# Patient Record
Sex: Female | Born: 1955
Health system: Southern US, Community
[De-identification: ages and names within clinical notes are randomized; demographics above are authoritative.]

## PROBLEM LIST (undated history)

## (undated) DIAGNOSIS — M8580 Other specified disorders of bone density and structure, unspecified site: Secondary | ICD-10-CM

## (undated) DIAGNOSIS — M810 Age-related osteoporosis without current pathological fracture: Secondary | ICD-10-CM

## (undated) DIAGNOSIS — C4492 Squamous cell carcinoma of skin, unspecified: Secondary | ICD-10-CM

## (undated) DIAGNOSIS — E785 Hyperlipidemia, unspecified: Secondary | ICD-10-CM

## (undated) DIAGNOSIS — R911 Solitary pulmonary nodule: Secondary | ICD-10-CM

## (undated) DIAGNOSIS — K219 Gastro-esophageal reflux disease without esophagitis: Secondary | ICD-10-CM

## (undated) HISTORY — DX: Other specified disorders of bone density and structure, unspecified site: M85.80

## (undated) HISTORY — PX: OTHER SURGICAL HISTORY: SHX169

## (undated) HISTORY — DX: Hyperlipidemia, unspecified: E78.5

## (undated) HISTORY — PX: TONSILLECTOMY: SUR1361

## (undated) HISTORY — DX: Age-related osteoporosis without current pathological fracture: M81.0

## (undated) HISTORY — DX: Squamous cell carcinoma of skin, unspecified: C44.92

## (undated) HISTORY — DX: Solitary pulmonary nodule: R91.1

---

## 2006-03-09 ENCOUNTER — Ambulatory Visit: Payer: Self-pay | Admitting: Internal Medicine

## 2006-03-09 ENCOUNTER — Ambulatory Visit (HOSPITAL_COMMUNITY): Admission: RE | Admit: 2006-03-09 | Discharge: 2006-03-09 | Payer: Self-pay | Admitting: Internal Medicine

## 2008-12-12 ENCOUNTER — Ambulatory Visit: Payer: Self-pay | Admitting: Cardiology

## 2016-02-10 ENCOUNTER — Telehealth: Payer: Self-pay | Admitting: Internal Medicine

## 2016-02-10 NOTE — Telephone Encounter (Signed)
RECALL FOR TCS °

## 2016-02-11 NOTE — Telephone Encounter (Signed)
Letter mailed to pt.  

## 2016-07-24 ENCOUNTER — Ambulatory Visit (INDEPENDENT_AMBULATORY_CARE_PROVIDER_SITE_OTHER): Payer: BC Managed Care – PPO | Admitting: Urology

## 2016-07-24 DIAGNOSIS — N952 Postmenopausal atrophic vaginitis: Secondary | ICD-10-CM | POA: Diagnosis not present

## 2016-07-24 DIAGNOSIS — R3121 Asymptomatic microscopic hematuria: Secondary | ICD-10-CM

## 2016-07-24 DIAGNOSIS — R350 Frequency of micturition: Secondary | ICD-10-CM

## 2017-08-12 ENCOUNTER — Encounter: Payer: Self-pay | Admitting: Gastroenterology

## 2017-08-12 ENCOUNTER — Ambulatory Visit: Payer: BC Managed Care – PPO | Admitting: Gastroenterology

## 2017-08-12 VITALS — BP 127/75 | HR 91 | Temp 97.0°F | Ht 66.0 in | Wt 119.0 lb

## 2017-08-12 DIAGNOSIS — R198 Other specified symptoms and signs involving the digestive system and abdomen: Secondary | ICD-10-CM

## 2017-08-12 DIAGNOSIS — K5900 Constipation, unspecified: Secondary | ICD-10-CM | POA: Insufficient documentation

## 2017-08-12 DIAGNOSIS — R140 Abdominal distension (gaseous): Secondary | ICD-10-CM

## 2017-08-12 NOTE — Progress Notes (Signed)
Primary Care Physician:  Rory Percy, MD  Primary Gastroenterologist:  Garfield Cornea, MD   Chief Complaint  Patient presents with  . Constipation  . Colonoscopy    consult    HPI:  [name] is a 61 y.o. female here to discuss updating her colonoscopy. Last colonoscopy 2007.  She is overdue by 1 year.  She is fully aware of the importance of colonoscopy as her husband had colon cancer many years ago.  Over the years she has had increasing issues with gas.  Last year, urinary hesitancy, frequency.  GYN detected microscopic hematuria.  Patient was seen by Dr. Jeffie Pollock 06/2016.  She reports cystoscopy was unremarkable.  She reports that she was started on Estrace for vaginal atrophy.  She saw her GYN, in [place].  Complains of bloating, pelvic ultrasound according to patient was unremarkable.  Patient states she has been on fiber supplement for years.  Over the past year she feels like she has incomplete bowel movements.  She has some stool every day but feels like is not enough.  Stools are more narrow.  No melena or rectal bleeding.  She tries to avoid grain and dairy as it seems to worsen her symptoms.  She eats cheese about once per week.  Uses almond milk.  Avoids carbonated beverages.  Recent labs with PCP dated August 2018 were unremarkable with regards to CBC, LFTs, basic metabolic panel, TSH.      Current Outpatient Medications  Medication Sig Dispense Refill  . atorvastatin (LIPITOR) 10 MG tablet Take 0.5 tablets daily by mouth.  1  . estradiol (ESTRACE VAGINAL) 0.1 MG/GM vaginal cream Place 1 Applicatorful 2 (two) times a week vaginally.     . nabumetone (RELAFEN) 500 MG tablet Take 500 mg as needed by mouth.    . RESTASIS 0.05 % ophthalmic emulsion Place 2 drops daily into both eyes.  0  . valACYclovir (VALTREX) 500 MG tablet Take 500 mg as needed by mouth.     No current facility-administered medications for this visit.     Allergies as of 08/12/2017 - Review Complete  08/12/2017  Allergen Reaction Noted  . Amoxicillin  08/12/2017  . Bactrim [sulfamethoxazole-trimethoprim] Hives 08/12/2017  . Keflex [cephalexin] Hives 08/12/2017    Past Medical History:  Diagnosis Date  . Hyperlipidemia   . Osteopenia     Past Surgical History:  Procedure Laterality Date  . TONSILLECTOMY      Family History  Problem Relation Age of Onset  . Colon cancer Cousin     Social History   Socioeconomic History  . Marital status: Married    Spouse name: Not on file  . Number of children: Not on file  . Years of education: Not on file  . Highest education level: Not on file  Social Needs  . Financial resource strain: Not on file  . Food insecurity - worry: Not on file  . Food insecurity - inability: Not on file  . Transportation needs - medical: Not on file  . Transportation needs - non-medical: Not on file  Occupational History  . Not on file  Tobacco Use  . Smoking status: Never Smoker  . Smokeless tobacco: Never Used  Substance and Sexual Activity  . Alcohol use: No    Frequency: Never  . Drug use: No  . Sexual activity: Not on file  Other Topics Concern  . Not on file  Social History Narrative  . Not on file      ROS:  General: Negative for anorexia, weight loss, fever, chills, fatigue, weakness. Eyes: Negative for vision changes.  ENT: Negative for hoarseness, difficulty swallowing , nasal congestion. CV: Negative for chest pain, angina, palpitations, dyspnea on exertion, peripheral edema.  Respiratory: Negative for dyspnea at rest, dyspnea on exertion, cough, sputum, wheezing.  GI: See history of present illness. GU:  Negative for dysuria, hematuria, urinary incontinence, urinary frequency, nocturnal urination.  MS: Negative for joint pain, low back pain.  Derm: Negative for rash or itching.  Neuro: Negative for weakness, abnormal sensation, seizure, frequent headaches, memory loss, confusion.  Psych: Negative for anxiety, depression,  suicidal ideation, hallucinations.  Endo: Negative for unusual weight change.  Heme: Negative for bruising or bleeding. Allergy: Negative for rash or hives.    Physical Examination:  BP 127/75   Pulse 91   Temp (!) 97 F (36.1 C) (Oral)   Ht 5\' 6"  (1.676 m)   Wt 119 lb (54 kg)   BMI 19.21 kg/m    General: Well-nourished, well-developed in no acute distress.  Head: Normocephalic, atraumatic.   Eyes: Conjunctiva pink, no icterus. Mouth: Oropharyngeal mucosa moist and pink , no lesions erythema or exudate. Neck: Supple without thyromegaly, masses, or lymphadenopathy.  Lungs: Clear to auscultation bilaterally.  Heart: Regular rate and rhythm, no murmurs rubs or gallops.  Abdomen: Bowel sounds are normal, nontender, nondistended, no hepatosplenomegaly or masses, no abdominal bruits or    hernia , no rebound or guarding.   Rectal: Not performed Extremities: No lower extremity edema. No clubbing or deformities.  Neuro: Alert and oriented x 4 , grossly normal neurologically.  Skin: Warm and dry, no rash or jaundice.   Psych: Alert and cooperative, normal mood and affect.  Labs: As above  Imaging Studies: No results found.

## 2017-08-12 NOTE — Patient Instructions (Signed)
1. Please go for labs today. 2. Colonoscopy as scheduled. See separate instructions.  3. Approximately one week prior to your procedure, begin taking Linzess once daily on empty stomach to help move your bowels better. You can stay on this chronically if needed. Call for prescription if you desire.

## 2017-08-12 NOTE — Assessment & Plan Note (Signed)
61 year old female presenting to discuss updating her colonoscopy.  Her last colonoscopy was in 2007.  She was due last year but was unable to complete due to multiple family issues.  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  She complains of 1 year history of change in bowel habits.  Stool caliber is smaller.  She describes incomplete evacuation.  Complains of pelvic bloating.  Recommend colonoscopy at this time.  I have discussed the risks, alternatives, benefits with regards to but not limited to the risk of reaction to medication, bleeding, infection, perforation and the patient is agreeable to proceed. Written consent to be obtained.  Begin Linzess 145 mcg daily at least 5 days prior to her bowel prep.  Samples provided for her to try and she determined she wants to stay on long-term she will call for prescription.  Screen for celiac given bloating, intolerance to gluten.

## 2017-08-13 ENCOUNTER — Encounter: Payer: Self-pay | Admitting: *Deleted

## 2017-08-13 ENCOUNTER — Other Ambulatory Visit: Payer: Self-pay | Admitting: *Deleted

## 2017-08-13 ENCOUNTER — Telehealth: Payer: Self-pay | Admitting: *Deleted

## 2017-08-13 DIAGNOSIS — K5900 Constipation, unspecified: Secondary | ICD-10-CM

## 2017-08-13 DIAGNOSIS — R198 Other specified symptoms and signs involving the digestive system and abdomen: Secondary | ICD-10-CM

## 2017-08-13 MED ORDER — PEG 3350-KCL-NA BICARB-NACL 420 G PO SOLR: 4000.0000 mL | Freq: Once | ORAL | 0 refills | Status: AC

## 2017-08-13 NOTE — Progress Notes (Signed)
cc'ed to pcp °

## 2017-08-13 NOTE — Telephone Encounter (Signed)
Patient called back to schedule TCS. This is scheduled for 09/17/17 at 1:00pm, arrival 12:00pm at [facility]. Prep instructions discussed and mailed to pt. Prep also sent in.

## 2017-08-16 LAB — IGA: IGA/IMMUNOGLOBULIN A, SERUM: 72 mg/dL — AB (ref 87–352)

## 2017-08-16 LAB — TISSUE TRANSGLUTAMINASE, IGA

## 2017-09-01 ENCOUNTER — Telehealth: Payer: Self-pay | Admitting: General Practice

## 2017-09-01 NOTE — Telephone Encounter (Signed)
Patient had labs done a few weeks ago and was wanting results.  Routing to [place].

## 2017-09-02 NOTE — Progress Notes (Signed)
Please let patient know that her IgA level was just slightly low, likely insignificant, but this means that we will need her to have TTG, IgG done to make sure she does not have celiac disease.  Please schedule her for TTG, IgG this week.

## 2017-09-02 NOTE — Telephone Encounter (Signed)
lmom , for a return call

## 2017-09-02 NOTE — Telephone Encounter (Signed)
See result note.  

## 2017-09-03 ENCOUNTER — Other Ambulatory Visit: Payer: Self-pay

## 2017-09-03 ENCOUNTER — Telehealth: Payer: Self-pay | Admitting: Internal Medicine

## 2017-09-03 DIAGNOSIS — K5900 Constipation, unspecified: Secondary | ICD-10-CM

## 2017-09-03 NOTE — Telephone Encounter (Signed)
Pt notified of results and will have labs drawn as directed per LSL

## 2017-09-03 NOTE — Progress Notes (Signed)
Lab entered for TTG, IgG at [place].

## 2017-09-03 NOTE — Addendum Note (Signed)
Addended by: Mahala Menghini on: 09/03/2017 11:27 AM   Modules accepted: Orders

## 2017-09-03 NOTE — Telephone Encounter (Signed)
Returned called, pt notified of results and will have labs drawn as directed per LSL

## 2017-09-03 NOTE — Telephone Encounter (Signed)
Pt said she was returning a call from AM. Please call her back at [phone]

## 2017-09-08 ENCOUNTER — Telehealth: Payer: Self-pay

## 2017-09-08 ENCOUNTER — Other Ambulatory Visit: Payer: Self-pay

## 2017-09-08 DIAGNOSIS — K5900 Constipation, unspecified: Secondary | ICD-10-CM

## 2017-09-08 NOTE — Telephone Encounter (Signed)
Please call patient back at [phone] She said that she went to [facility] on Saturday and they didn't have her lab orders and she needed to have them done prior to her procedure. I transferred call to AM VM.

## 2017-09-08 NOTE — Telephone Encounter (Signed)
Spoke with pt, pt can go to lab and have labs drawn

## 2017-09-09 LAB — TISSUE TRANSGLUTAMINASE, IGG: (TTG) AB, IGG: 1 U/mL

## 2017-09-13 ENCOUNTER — Telehealth: Payer: Self-pay | Admitting: Internal Medicine

## 2017-09-13 NOTE — Telephone Encounter (Signed)
Pt has a procedure scheduled for 12/21 and is anxious to get her lab results from 12/12. Please call her at [phone]

## 2017-09-13 NOTE — Progress Notes (Signed)
Celiac screen negative. Ttg, IgG normal.

## 2017-09-13 NOTE — Telephone Encounter (Signed)
See result note.  

## 2017-09-13 NOTE — Telephone Encounter (Signed)
Lmom, looking into lab results

## 2017-09-13 NOTE — Telephone Encounter (Signed)
Pt is calling about her lab results °

## 2017-09-14 ENCOUNTER — Encounter: Payer: Self-pay | Admitting: *Deleted

## 2017-09-14 NOTE — Telephone Encounter (Signed)
cc'ed to pcp °

## 2017-09-15 ENCOUNTER — Telehealth: Payer: Self-pay | Admitting: *Deleted

## 2017-09-15 NOTE — Telephone Encounter (Signed)
Patient called in stating she spoke with preservice center and they advised her that her TCS was not a screening and coded as diagnostic. She states if it not a screening then her insurance does not cover this at 100%. I advised her per her last OV she had "change in bowel habits/constipation". Nothing further needed

## 2017-09-17 ENCOUNTER — Other Ambulatory Visit: Payer: Self-pay

## 2017-09-17 ENCOUNTER — Encounter (HOSPITAL_COMMUNITY): Payer: Self-pay | Admitting: *Deleted

## 2017-09-17 ENCOUNTER — Ambulatory Visit (HOSPITAL_COMMUNITY)
Admission: RE | Admit: 2017-09-17 | Discharge: 2017-09-17 | Disposition: A | Discharge status: Home / Self Care | Payer: BC Managed Care – PPO | Source: Ambulatory Visit | Attending: Internal Medicine | Admitting: Internal Medicine

## 2017-09-17 ENCOUNTER — Encounter (HOSPITAL_COMMUNITY)
Admission: RE | Disposition: A | Discharge status: Home / Self Care | Payer: Self-pay | Source: Ambulatory Visit | Attending: Internal Medicine

## 2017-09-17 DIAGNOSIS — E785 Hyperlipidemia, unspecified: Secondary | ICD-10-CM | POA: Insufficient documentation

## 2017-09-17 DIAGNOSIS — R194 Change in bowel habit: Secondary | ICD-10-CM | POA: Diagnosis present

## 2017-09-17 DIAGNOSIS — Z79890 Hormone replacement therapy (postmenopausal): Secondary | ICD-10-CM | POA: Insufficient documentation

## 2017-09-17 DIAGNOSIS — Z79899 Other long term (current) drug therapy: Secondary | ICD-10-CM | POA: Diagnosis not present

## 2017-09-17 DIAGNOSIS — R198 Other specified symptoms and signs involving the digestive system and abdomen: Secondary | ICD-10-CM

## 2017-09-17 DIAGNOSIS — K5900 Constipation, unspecified: Secondary | ICD-10-CM

## 2017-09-17 HISTORY — PX: COLONOSCOPY: SHX5424

## 2017-09-17 SURGERY — COLONOSCOPY
Anesthesia: Moderate Sedation

## 2017-09-17 MED ORDER — MEPERIDINE HCL 100 MG/ML IJ SOLN
INTRAMUSCULAR | Status: DC | PRN
  Administered 2017-09-17: 25 mg via INTRAVENOUS
  Administered 2017-09-17: 50 mg via INTRAVENOUS

## 2017-09-17 MED ORDER — MEPERIDINE HCL 100 MG/ML IJ SOLN
INTRAMUSCULAR | Status: AC
  Filled 2017-09-17: qty 2

## 2017-09-17 MED ORDER — STERILE WATER FOR IRRIGATION IR SOLN
Status: DC | PRN
  Administered 2017-09-17: 15 mL

## 2017-09-17 MED ORDER — MIDAZOLAM HCL 5 MG/5ML IJ SOLN
INTRAMUSCULAR | Status: DC | PRN
  Administered 2017-09-17: 1 mg via INTRAVENOUS
  Administered 2017-09-17: 2 mg via INTRAVENOUS

## 2017-09-17 MED ORDER — ONDANSETRON HCL 4 MG/2ML IJ SOLN
INTRAMUSCULAR | Status: DC | PRN
  Administered 2017-09-17: 4 mg via INTRAVENOUS

## 2017-09-17 MED ORDER — ONDANSETRON HCL 4 MG/2ML IJ SOLN
INTRAMUSCULAR | Status: AC
  Filled 2017-09-17: qty 2

## 2017-09-17 MED ORDER — MIDAZOLAM HCL 5 MG/5ML IJ SOLN
INTRAMUSCULAR | Status: AC
  Filled 2017-09-17: qty 10

## 2017-09-17 MED ORDER — SODIUM CHLORIDE 0.9 % IV SOLN
INTRAVENOUS | Status: DC
  Administered 2017-09-17: 12:00:00 via INTRAVENOUS

## 2017-09-17 NOTE — Progress Notes (Signed)
Patient states "I think I am finally ready to go".

## 2017-09-17 NOTE — H&P (Signed)
@LOGO @   Primary Care Physician:  Rory Percy, MD Primary Gastroenterologist:  Dr. Gala Romney  Pre-Procedure History & Physical: HPI:  [name] is a 61 y.o. female here for screening colonoscopy. Hasn't had any bowel symptoms aside from constipation lately which has been treated with lenses. No family history of colon cancer in any first or second-degree relatives.  Past Medical History:  Diagnosis Date  . Hyperlipidemia   . Osteopenia     Past Surgical History:  Procedure Laterality Date  . TONSILLECTOMY      Prior to Admission medications   Medication Sig Start Date End Date Taking? Authorizing Provider  atorvastatin (LIPITOR) 10 MG tablet Take 0.5 tablets daily by mouth. 07/28/17  Yes [provider]  Calcium Polycarbophil (FIBER-CAPS PO) Take 1 capsule by mouth daily.   Yes [provider]  Calcium-Phosphorus-Vitamin D (CITRACAL +D3 PO) Take 2 tablets by mouth 2 (two) times daily.   Yes [provider]  Carboxymethylcellul-Glycerin (REFRESH OPTIVE OP) Place 1 drop into both eyes 2 (two) times daily. Refresh Repair   Yes [provider]  cycloSPORINE (RESTASIS) 0.05 % ophthalmic emulsion Place 1 drop into both eyes 2 (two) times daily.   Yes [provider]  estradiol (ESTRACE VAGINAL) 0.1 MG/GM vaginal cream Place 1 Applicatorful 2 (two) times a week vaginally.    Yes [provider]  Lifitegrast Shirley Friar) 5 % SOLN Place 1 drop into both eyes 2 (two) times daily. Shirley Friar   Yes [provider]  linaclotide (LINZESS) 145 MCG CAPS capsule Take 145 mcg by mouth daily before breakfast.   Yes [provider]  Multiple Vitamins-Minerals (MULTIVITAMIN WITH MINERALS) tablet Take 1 tablet by mouth daily.   Yes [provider]  valACYclovir (VALTREX) 1000 MG tablet Take 500 mg by mouth 2 (two) times daily as needed (fever blisters).    Yes [provider]  nabumetone (RELAFEN) 500 MG tablet Take 500 mg by  mouth 2 (two) times daily as needed for mild pain.     [provider]    Allergies as of 08/13/2017 - Review Complete 08/12/2017  Allergen Reaction Noted  . Amoxicillin  08/12/2017  . Bactrim [sulfamethoxazole-trimethoprim] Hives 08/12/2017  . Keflex [cephalexin] Hives 08/12/2017    Family History  Problem Relation Age of Onset  . Colon cancer Cousin     Social History   Socioeconomic History  . Marital status: Married    Spouse name: Not on file  . Number of children: Not on file  . Years of education: Not on file  . Highest education level: Not on file  Social Needs  . Financial resource strain: Not on file  . Food insecurity - worry: Not on file  . Food insecurity - inability: Not on file  . Transportation needs - medical: Not on file  . Transportation needs - non-medical: Not on file  Occupational History  . Not on file  Tobacco Use  . Smoking status: Never Smoker  . Smokeless tobacco: Never Used  Substance and Sexual Activity  . Alcohol use: No    Frequency: Never  . Drug use: No  . Sexual activity: Not on file  Other Topics Concern  . Not on file  Social History Narrative  . Not on file    Review of Systems: See HPI, otherwise negative ROS  Physical Exam: BP 114/73   Pulse (!) 110   Temp 98.4 F (36.9 C) (Oral)   Resp 17   Ht 5\' 6"  (  1.676 m)   Wt 120 lb (54.4 kg)   SpO2 100%   BMI 19.37 kg/m  General:   Alert,  Well-developed, well-nourished, pleasant and cooperative in NAD Skin:  Intact without significant lesions or rashes. Neck:  Supple; no masses or thyromegaly. No significant cervical adenopathy. Lungs:  Clear throughout to auscultation.   No wheezes, crackles, or rhonchi. No acute distress. Heart:  Regular rate and rhythm; no murmurs, clicks, rubs,  or gallops. Abdomen: Non-distended, normal bowel sounds.  Soft and nontender without appreciable mass or hepatosplenomegaly.  Pulses:  Normal pulses noted. Extremities:  Without  clubbing or edema.  Impression:  61 year old lady here for average risk screening colonoscopy.  Recommendations: I have offered the patient at average risk screening colonoscopy today.  The risks, benefits, limitations, alternatives and imponderables have been reviewed with the patient. Questions have been answered. All parties are agreeable.    Notice: This dictation was prepared with Dragon dictation along with smaller phrase technology. Any transcriptional errors that result from this process are unintentional and may not be corrected upon review.

## 2017-09-17 NOTE — Op Note (Signed)
[facility] Patient Name: [name] Procedure Date: 09/17/2017 12:10 PM MRN: [id number] Date of Birth: 10/19/1955 Attending MD: Norvel Richards , MD CSN: [id number] Age: 61 Admit Type: Outpatient Procedure:                Colonoscopy Indications:              Change in bowel habits Providers:                Norvel Richards, MD, Lurline Del, RN, Charlsie Quest.                            Theda Sers RN, RN Referring MD:              Medicines:                Midazolam 3 mg IV, Meperidine 75 mg IV, Ondansetron                            4 mg IV Complications:            No immediate complications. Estimated Blood Loss:     Estimated blood loss: none. Procedure:                Pre-Anesthesia Assessment:                           - Prior to the procedure, a History and Physical                            was performed, and patient medications and                            allergies were reviewed. The patient's tolerance of                            previous anesthesia was also reviewed. The risks                            and benefits of the procedure and the sedation                            options and risks were discussed with the patient.                            All questions were answered, and informed consent                            was obtained. Prior Anticoagulants: The patient has                            taken no previous anticoagulant or antiplatelet                            agents. ASA Grade Assessment: II - A patient with  mild systemic disease. After reviewing the risks                            and benefits, the patient was deemed in                            satisfactory condition to undergo the procedure.                           After obtaining informed consent, the colonoscope                            was passed under direct vision. Throughout the                            procedure, the patient's blood pressure,  pulse, and                            oxygen saturations were monitored continuously. The                            EC-[id number] ([id number]) scope was introduced through                            the anus and advanced to the the cecum, identified                            by appendiceal orifice and ileocecal valve. The                            ileocecal valve, appendiceal orifice, and rectum                            were photographed. The entire colon was well                            visualized. The colonoscopy was performed without                            difficulty. The quality of the bowel preparation                            was adequate. Scope In: 12:34:01 AM Scope Out: 12:44:31 PM Scope Withdrawal Time: 0 hours 5 minutes 59 seconds  Total Procedure Duration: 12 hours 10 minutes 30 seconds  Findings:      The perianal and digital rectal examinations were normal.      The colon (entire examined portion) appeared normal.      No additional abnormalities were found on retroflexion. Impression:               - The entire examined colon is normal.                           - No specimens collected. Moderate Sedation:      Moderate (  conscious) sedation was administered by the endoscopy nurse       and supervised by the endoscopist. The following parameters were       monitored: oxygen saturation, heart rate, blood pressure, respiratory       rate, EKG, adequacy of pulmonary ventilation, and response to care.       Total physician intraservice time was 16 minutes. Recommendation:           - Patient has a contact number available for                            emergencies. The signs and symptoms of potential                            delayed complications were discussed with the                            patient. Return to normal activities tomorrow.                            Written discharge instructions were provided to the                            patient.                            - Resume previous diet.                           - Continue present medications. Stop current fiber                            supplement; begin Benefiber 1 tablespoon twice                            daily.                           - Await pathology results.                           - Repeat colonoscopy in 10 years for screening                            purposes.                           - Return to GI clinic in 6 weeks. Procedure Code(s):        --- Professional ---                           [phone], Colonoscopy, flexible; diagnostic, including                            collection of specimen(s) by brushing or washing,  when performed (separate procedure)                           99152, Moderate sedation services provided by the                            same physician or other qualified health care                            professional performing the diagnostic or                            therapeutic service that the sedation supports,                            requiring the presence of an independent trained                            observer to assist in the monitoring of the                            patient's level of consciousness and physiological                            status; initial 15 minutes of intraservice time,                            patient age 70 years or older Diagnosis Code(s):        --- Professional ---                           R19.4, Change in bowel habit CPT copyright 2016 American Medical Association. All rights reserved. The codes documented in this report are preliminary and upon coder review may  be revised to meet current compliance requirements. [name]. [name], MD Norvel Richards, MD 09/17/2017 12:53:25 PM This report has been signed electronically. Number of Addenda: 0

## 2017-09-17 NOTE — Discharge Instructions (Signed)
°  Colonoscopy Discharge Instructions  Read the instructions outlined below and refer to this sheet in the next few weeks. These discharge instructions provide you with general information on caring for yourself after you leave the hospital. Your doctor may also give you specific instructions. While your treatment has been planned according to the most current medical practices available, unavoidable complications occasionally occur. If you have any problems or questions after discharge, call Dr. Gala Romney at [phone]. ACTIVITY  You may resume your regular activity, but move at a slower pace for the next 24 hours.   Take frequent rest periods for the next 24 hours.   Walking will help get rid of the air and reduce the bloated feeling in your belly (abdomen).   No driving for 24 hours (because of the medicine (anesthesia) used during the test).    Do not sign any important legal documents or operate any machinery for 24 hours (because of the anesthesia used during the test).  NUTRITION  Drink plenty of fluids.   You may resume your normal diet as instructed by your doctor.   Begin with a light meal and progress to your normal diet. Heavy or fried foods are harder to digest and may make you feel sick to your stomach (nauseated).   Avoid alcoholic beverages for 24 hours or as instructed.  MEDICATIONS  You may resume your normal medications unless your doctor tells you otherwise.  WHAT YOU CAN EXPECT TODAY  Some feelings of bloating in the abdomen.   Passage of more gas than usual.   Spotting of blood in your stool or on the toilet paper.  IF YOU HAD POLYPS REMOVED DURING THE COLONOSCOPY:  No aspirin products for 7 days or as instructed.   No alcohol for 7 days or as instructed.   Eat a soft diet for the next 24 hours.  FINDING OUT THE RESULTS OF YOUR TEST Not all test results are available during your visit. If your test results are not back during the visit, make an appointment  with your caregiver to find out the results. Do not assume everything is normal if you have not heard from your caregiver or the medical facility. It is important for you to follow up on all of your test results.  SEEK IMMEDIATE MEDICAL ATTENTION IF:  You have more than a spotting of blood in your stool.   Your belly is swollen (abdominal distention).   You are nauseated or vomiting.   You have a temperature over 101.   You have abdominal pain or discomfort that is severe or gets worse throughout the day.    Your colonoscopy was normal today  I recommend you have a repeat colonoscopy for screening purposes in 10 years  Stop current fiber supplements; begin Benefiber 1 tablespoon twice daily.  FOLLOW-up with [place] in 4-6 weeks

## 2017-09-23 ENCOUNTER — Encounter (HOSPITAL_COMMUNITY): Payer: Self-pay | Admitting: Internal Medicine

## 2017-11-05 ENCOUNTER — Ambulatory Visit: Payer: BC Managed Care – PPO | Admitting: Internal Medicine

## 2017-11-05 ENCOUNTER — Encounter: Payer: Self-pay | Admitting: Internal Medicine

## 2017-11-05 VITALS — BP 117/73 | HR 68 | Temp 97.9°F | Ht 65.0 in | Wt 119.8 lb

## 2017-11-05 DIAGNOSIS — K9289 Other specified diseases of the digestive system: Secondary | ICD-10-CM | POA: Diagnosis not present

## 2017-11-05 NOTE — Progress Notes (Signed)
Primary Care Physician:  Rory Percy, MD Primary Gastroenterologist:  Dr. Gala Romney  Pre-Procedure History & Physical: HPI:  [name] is a 62 y.o. female here for Follow-up of gastric change in bowel habits. Benefiber 2 tablespoons daily has been associated with a much better evacuation  -  patient feels less full; however,  she did complains of bloating. She has not had any diarrhea. Celiac markers came back negative. Colonoscopy revealed no abnormalities;  not taking a probiotic. Her appetite is well-maintained.  She has not lost any weight. She avoids lactose,  artificial sweeteners as a preventative measure.  Past Medical History:  Diagnosis Date  . Hyperlipidemia   . Osteopenia     Past Surgical History:  Procedure Laterality Date  . COLONOSCOPY N/A 09/17/2017   Procedure: COLONOSCOPY;  Surgeon: Daneil Dolin, MD;  Location: [facility];  Service: Endoscopy;  Laterality: N/A;  1:00PM  . TONSILLECTOMY      Prior to Admission medications   Medication Sig Start Date End Date Taking? Authorizing Provider  atorvastatin (LIPITOR) 10 MG tablet Take 0.5 tablets daily by mouth. 07/28/17  Yes [provider]  Calcium Polycarbophil (FIBER-CAPS PO) Take 1 capsule by mouth daily.   Yes [provider]  Calcium-Phosphorus-Vitamin D (CITRACAL +D3 PO) Take 2 tablets by mouth 2 (two) times daily.   Yes [provider]  Carboxymethylcellul-Glycerin (REFRESH OPTIVE OP) Place 1 drop into both eyes 2 (two) times daily. Refresh Repair   Yes [provider]  cycloSPORINE (RESTASIS) 0.05 % ophthalmic emulsion Place 1 drop into both eyes 2 (two) times daily.   Yes [provider]  estradiol (ESTRACE VAGINAL) 0.1 MG/GM vaginal cream Place 1 Applicatorful 2 (two) times a week vaginally.    Yes [provider]  Multiple Vitamins-Minerals (MULTIVITAMIN WITH MINERALS) tablet Take 1 tablet by mouth daily.   Yes [provider]  nabumetone  (RELAFEN) 500 MG tablet Take 500 mg by mouth 2 (two) times daily as needed for mild pain.    Yes [provider]  valACYclovir (VALTREX) 1000 MG tablet Take 500 mg by mouth 2 (two) times daily as needed (fever blisters).    Yes [provider]  Lifitegrast Shirley Friar) 5 % SOLN Place 1 drop into both eyes 2 (two) times daily. Shirley Friar    [provider]  linaclotide Rolan Lipa) 145 MCG CAPS capsule Take 145 mcg by mouth daily before breakfast.    [provider]    Allergies as of 11/05/2017 - Review Complete 11/05/2017  Allergen Reaction Noted  . Amoxicillin  08/12/2017  . Bactrim [sulfamethoxazole-trimethoprim] Hives 08/12/2017  . Keflex [cephalexin] Hives 08/12/2017    Family History  Problem Relation Age of Onset  . Colon cancer Cousin     Social History   Socioeconomic History  . Marital status: Married    Spouse name: Not on file  . Number of children: Not on file  . Years of education: Not on file  . Highest education level: Not on file  Social Needs  . Financial resource strain: Not on file  . Food insecurity - worry: Not on file  . Food insecurity - inability: Not on file  . Transportation needs - medical: Not on file  . Transportation needs - non-medical: Not on file  Occupational History  . Not on file  Tobacco Use  . Smoking status: Never Smoker  . Smokeless tobacco: Never Used  Substance and Sexual Activity  . Alcohol use: No  Frequency: Never  . Drug use: No  . Sexual activity: Not on file  Other Topics Concern  . Not on file  Social History Narrative  . Not on file    Review of Systems: See HPI, otherwise negative ROS  Physical Exam: BP 117/73   Pulse 68   Temp 97.9 F (36.6 C)   Ht 5\' 5"  (1.651 m)   Wt 119 lb 12.8 oz (54.3 kg)   BMI 19.94 kg/m  General:   Alert,  , pleasant and cooperative in NAD Neck:  Supple; no masses or thyromegaly. No significant cervical adenopathy. Lungs:  Clear throughout to  auscultation.   No wheezes, crackles, or rhonchi. No acute distress. Heart:  Regular rate and rhythm; no murmurs, clicks, rubs,  or gallops. Abdomen: Non-distended, normal bowel sounds.  Soft and nontender without appreciable mass or hepatosplenomegaly.  Pulses:  Normal pulses noted. Extremities:  Without clubbing or edema.  Impression:  Pleasant 62 year old lady with a gas bloat/, change in bowel habits notably improved on 2 tablespoons of Benefiber daily. Continues to have some persistent bloating. Workup thus far reassuring. I feel that we can gain better control of her symptoms with the addition of a probiotic.  Recommendations:  Continue Benefiber 1 tablespoon twice daily  Trial of Probiotic such as Align, Digestive Advantage or Rio (take in the middle of the day)  Office visit in 3 months           Notice: This dictation was prepared with Dragon dictation along with smaller phrase technology. Any transcriptional errors that result from this process are unintentional and may not be corrected upon review.

## 2017-11-05 NOTE — Patient Instructions (Signed)
Continue Benefiber 1 tablespoon twice daily  Trial of Probiotic such as Align, Digestive Advantage or [facility] (take in the middle of the day)  Office visit in 3 months

## 2018-02-02 ENCOUNTER — Ambulatory Visit: Payer: BC Managed Care – PPO | Admitting: Gastroenterology

## 2018-07-27 ENCOUNTER — Encounter: Payer: BC Managed Care – PPO | Attending: Internal Medicine | Admitting: Internal Medicine

## 2018-07-27 DIAGNOSIS — L97211 Non-pressure chronic ulcer of right calf limited to breakdown of skin: Secondary | ICD-10-CM | POA: Insufficient documentation

## 2018-07-27 DIAGNOSIS — Y848 Other medical procedures as the cause of abnormal reaction of the patient, or of later complication, without mention of misadventure at the time of the procedure: Secondary | ICD-10-CM | POA: Insufficient documentation

## 2018-07-27 DIAGNOSIS — T8131XA Disruption of external operation (surgical) wound, not elsewhere classified, initial encounter: Secondary | ICD-10-CM | POA: Diagnosis not present

## 2018-07-27 DIAGNOSIS — C44722 Squamous cell carcinoma of skin of right lower limb, including hip: Secondary | ICD-10-CM | POA: Insufficient documentation

## 2018-07-27 DIAGNOSIS — Z881 Allergy status to other antibiotic agents status: Secondary | ICD-10-CM | POA: Diagnosis not present

## 2018-07-27 DIAGNOSIS — Z885 Allergy status to narcotic agent status: Secondary | ICD-10-CM | POA: Insufficient documentation

## 2018-07-27 DIAGNOSIS — Z8249 Family history of ischemic heart disease and other diseases of the circulatory system: Secondary | ICD-10-CM | POA: Insufficient documentation

## 2018-07-27 DIAGNOSIS — Z882 Allergy status to sulfonamides status: Secondary | ICD-10-CM | POA: Diagnosis not present

## 2018-07-29 NOTE — Progress Notes (Signed)
ARIAH, MOWER ([id number]) Visit Report for 07/27/2018 Abuse/Suicide Risk Screen Details Patient Name: [name], OELKERS Date of Service: 07/27/2018 8:45 AM Medical Record Number: [id number] Patient Account Number: [id number] Date of Birth/Sex: Nov 30, 1955 (62 y.o. F) Treating RN: Montey Hora Primary Care Dollie Bressi: Rory Percy Other Clinician: Referring Koi Yarbro: Referral, Self Treating Wade Sigala/Extender: Tito Dine in Treatment: 0 Abuse/Suicide Risk Screen Items Answer ABUSE/SUICIDE RISK SCREEN: Has anyone close to you tried to hurt or harm you recentlyo No Do you feel uncomfortable with anyone in your familyo No Has anyone forced you do things that you didnot want to doo No Do you have any thoughts of harming yourselfo No Patient displays signs or symptoms of abuse and/or neglect. No Electronic Signature(s) Signed: 07/27/2018 5:41:21 PM By: Montey Hora Entered By: Montey Hora on 07/27/2018 09:16:30 [name] ([id number]) -------------------------------------------------------------------------------- Activities of Daily Living Details Patient Name: [name] Date of Service: 07/27/2018 8:45 AM Medical Record Number: [id number] Patient Account Number: [id number] Date of Birth/Sex: [birth year] (62 y.o. F) Treating RN: Montey Hora Primary Care Shahd Occhipinti: Rory Percy Other Clinician: Referring Johnye Kist: Referral, Self Treating Emelynn Rance/Extender: Tito Dine in Treatment: 0 Activities of Daily Living Items Answer Activities of Daily Living (Please select one for each item) Drive Automobile Completely Able Take Medications Completely Able Use Telephone Completely Able Care for Appearance Completely Able Use Toilet Completely Able Bath / Shower Completely Able Dress Self Completely Able Feed Self Completely Able Walk Completely Able Get In / Out Bed Completely Able Housework Completely Able Prepare Meals Completely Able Handle Money  Completely Able Shop for Self Completely Able Electronic Signature(s) Signed: 07/27/2018 5:41:21 PM By: Montey Hora Entered By: Montey Hora on 07/27/2018 09:16:50 [name] ([id number]) -------------------------------------------------------------------------------- Education Assessment Details Patient Name: [name] Date of Service: 07/27/2018 8:45 AM Medical Record Number: [id number] Patient Account Number: [id number] Date of Birth/Sex: [birth year] (62 y.o. F) Treating RN: Montey Hora Primary Care Mariah Harn: Rory Percy Other Clinician: Referring Arcangel Minion: Referral, Self Treating Alianna Wurster/Extender: Tito Dine in Treatment: 0 Primary Learner Assessed: Patient Learning Preferences/Education Level/Primary Language Learning Preference: Explanation, Demonstration Highest Education Level: College or Above Preferred Language: English Cognitive Barrier Assessment/Beliefs Language Barrier: No Translator Needed: No Memory Deficit: No Emotional Barrier: No Cultural/Religious Beliefs Affecting Medical Care: No Physical Barrier Assessment Impaired Vision: No Impaired Hearing: No Decreased Hand dexterity: No Knowledge/Comprehension Assessment Knowledge Level: Medium Comprehension Level: Medium Ability to understand written Medium instructions: Ability to understand verbal Medium instructions: Motivation Assessment Anxiety Level: Calm Cooperation: Cooperative Education Importance: Acknowledges Need Interest in Health Problems: Asks Questions Perception: Coherent Willingness to Engage in Self- Medium Management Activities: Readiness to Engage in Self- Medium Management Activities: Electronic Signature(s) Signed: 07/27/2018 5:41:21 PM By: Montey Hora Entered By: Montey Hora on 07/27/2018 09:17:23 [name] ([id number]) -------------------------------------------------------------------------------- Fall Risk Assessment Details Patient  Name: [name] Date of Service: 07/27/2018 8:45 AM Medical Record Number: [id number] Patient Account Number: [id number] Date of Birth/Sex: [birth year] (62 y.o. F) Treating RN: Montey Hora Primary Care Lucyann Romano: Rory Percy Other Clinician: Referring Ameir Faria: Referral, Self Treating Carlin Attridge/Extender: Tito Dine in Treatment: 0 Fall Risk Assessment Items Have you had 2 or more falls in the last 12 monthso 0 No Have you had any fall that resulted in injury in the last 12 monthso 0 No FALL RISK ASSESSMENT: History of falling - immediate or within 3 months 0 No Secondary diagnosis 0 No Ambulatory aid None/bed rest/wheelchair/nurse 0 Yes Crutches/cane/walker 0 No Furniture  0 No IV Access/Saline Lock 0 No Gait/Training Normal/bed rest/immobile 0 Yes Weak 0 No Impaired 0 No Mental Status Oriented to own ability 0 Yes Electronic Signature(s) Signed: 07/27/2018 5:41:21 PM By: Montey Hora Entered By: Montey Hora on 07/27/2018 09:17:36 Alper, Rondel Jumbo ([id number]) -------------------------------------------------------------------------------- Foot Assessment Details Patient Name: [name] Date of Service: 07/27/2018 8:45 AM Medical Record Number: [id number] Patient Account Number: [id number] Date of Birth/Sex: [birth year] (62 y.o. F) Treating RN: Montey Hora Primary Care Blyss Lugar: Rory Percy Other Clinician: Referring Natesha Hassey: Referral, Self Treating Vici Novick/Extender: Tito Dine in Treatment: 0 Foot Assessment Items Site Locations + = Sensation present, - = Sensation absent, C = Callus, U = Ulcer R = Redness, W = Warmth, M = Maceration, PU = Pre-ulcerative lesion F = Fissure, S = Swelling, D = Dryness Assessment Right: Left: Other Deformity: No No Prior Foot Ulcer: No No Prior Amputation: No No Charcot Joint: No No Ambulatory Status: Ambulatory Without Help Gait: Steady Electronic Signature(s) Signed: 07/27/2018 5:41:21 PM By:  Montey Hora Entered By: Montey Hora on 07/27/2018 09:28:50 [name] ([id number]) -------------------------------------------------------------------------------- Nutrition Risk Assessment Details Patient Name: [name] Date of Service: 07/27/2018 8:45 AM Medical Record Number: [id number] Patient Account Number: [id number] Date of Birth/Sex: 05/22/1956 (62 y.o. F) Treating RN: Montey Hora Primary Care Dessie Delcarlo: Rory Percy Other Clinician: Referring Yael Coppess: Referral, Self Treating Kaisha Wachob/Extender: Tito Dine in Treatment: 0 Height (in): 66 Weight (lbs): 120 Body Mass Index (BMI): 19.4 Nutrition Risk Assessment Items NUTRITION RISK SCREEN: I have an illness or condition that made me change the kind and/or amount of 0 No food I eat I eat fewer than two meals per day 0 No I eat few fruits and vegetables, or milk products 0 No I have three or more drinks of beer, liquor or wine almost every day 0 No I have tooth or mouth problems that make it hard for me to eat 0 No I don't always have enough money to buy the food I need 0 No I eat alone most of the time 0 No I take three or more different prescribed or over-the-counter drugs a day 1 Yes Without wanting to, I have lost or gained 10 pounds in the last six months 0 No I am not always physically able to shop, cook and/or feed myself 0 No Nutrition Protocols Good Risk Protocol 0 No interventions needed Moderate Risk Protocol Electronic Signature(s) Signed: 07/27/2018 5:41:21 PM By: Montey Hora Entered By: Montey Hora on 07/27/2018 09:17:42

## 2018-07-30 NOTE — Progress Notes (Signed)
LOLLY, GLAUS ([id number]) Visit Report for 07/27/2018 Allergy List Details Patient Name: [name], [name] Date of Service: 07/27/2018 8:45 AM Medical Record Number: [id number] Patient Account Number: [id number] Date of Birth/Sex: 30-Dec-1955 (62 y.o. F) Treating RN: Montey Hora Primary Care Phil Michels: Rory Percy Other Clinician: Referring Vidya Bamford: Referral, Self Treating Johnika Escareno/Extender: Ricard Dillon Weeks in Treatment: 0 Allergies Active Allergies Keflex Bactrim hydrocodone Allergy Notes Electronic Signature(s) Signed: 07/27/2018 5:41:21 PM By: Montey Hora Entered By: Montey Hora on 07/27/2018 09:19:42 [name] ([id number]) -------------------------------------------------------------------------------- Arrival Information Details Patient Name: [name] Date of Service: 07/27/2018 8:45 AM Medical Record Number: [id number] Patient Account Number: [id number] Date of Birth/Sex: 1956/08/31 (62 y.o. F) Treating RN: Cornell Barman Primary Care Semisi Biela: Rory Percy Other Clinician: Referring Diksha Tagliaferro: Referral, Self Treating Zohal Reny/Extender: Tito Dine in Treatment: 0 Visit Information Patient Arrived: Ambulatory Arrival Time: 08:44 Accompanied By: self Transfer Assistance: None Patient Identification Verified: Yes Secondary Verification Process Completed: Yes Electronic Signature(s) Signed: 07/27/2018 4:40:44 PM By: Lorine Bears RCP, RRT, CHT Entered By: Lorine Bears on 07/27/2018 08:46:27 [name] ([id number]) -------------------------------------------------------------------------------- Clinic Level of Care Assessment Details Patient Name: [name] Date of Service: 07/27/2018 8:45 AM Medical Record Number: [id number] Patient Account Number: [id number] Date of Birth/Sex: Oct 25, 1955 (62 y.o. F) Treating RN: Cornell Barman Primary Care Tavien Chestnut: Rory Percy Other Clinician: Referring Jamya Starry:  Referral, Self Treating Kayliegh Boyers/Extender: Tito Dine in Treatment: 0 Clinic Level of Care Assessment Items TOOL 1 Quantity Score []  - Use when EandM and Procedure is performed on INITIAL visit 0 ASSESSMENTS - Nursing Assessment / Reassessment []  - General Physical Exam (combine w/ comprehensive assessment (listed just below) when 0 performed on new pt. evals) X- 1 25 Comprehensive Assessment (HX, ROS, Risk Assessments, Wounds Hx, etc.) ASSESSMENTS - Wound and Skin Assessment / Reassessment []  - Dermatologic / Skin Assessment (not related to wound area) 0 ASSESSMENTS - Ostomy and/or Continence Assessment and Care []  - Incontinence Assessment and Management 0 []  - 0 Ostomy Care Assessment and Management (repouching, etc.) PROCESS - Coordination of Care X - Simple Patient / Family Education for ongoing care 1 15 []  - 0 Complex (extensive) Patient / Family Education for ongoing care X- 1 10 Staff obtains Programmer, systems, Records, Test Results / Process Orders []  - 0 Staff telephones HHA, Nursing Homes / Clarify orders / etc []  - 0 Routine Transfer to another Facility (non-emergent condition) []  - 0 Routine Hospital Admission (non-emergent condition) []  - 0 New Admissions / Biomedical engineer / Ordering NPWT, Apligraf, etc. []  - 0 Emergency Hospital Admission (emergent condition) PROCESS - Special Needs []  - Pediatric / Minor Patient Management 0 []  - 0 Isolation Patient Management []  - 0 Hearing / Language / Visual special needs []  - 0 Assessment of Community assistance (transportation, D/C planning, etc.) []  - 0 Additional assistance / Altered mentation []  - 0 Support Surface(s) Assessment (bed, cushion, seat, etc.) Anderson, Sally A. ([id number]) INTERVENTIONS - Miscellaneous []  - External ear exam 0 []  - 0 Patient Transfer (multiple staff / Civil Service fast streamer / Similar devices) []  - 0 Simple Staple / Suture removal (25 or less) []  - 0 Complex Staple / Suture  removal (26 or more) []  - 0 Hypo/Hyperglycemic Management (do not check if billed separately) X- 1 15 Ankle / Brachial Index (ABI) - do not check if billed separately Has the patient been seen at the hospital within the last three years: Yes Total Score: 65 Level Of Care: New/Established -  Level 2 Electronic Signature(s) Signed: 07/28/2018 7:26:08 AM By: Gretta Cool, BSN, RN, CWS, Kim RN, BSN Entered By: Gretta Cool, BSN, RN, CWS, Kim on 07/27/2018 10:01:37 [name] ([id number]) -------------------------------------------------------------------------------- Encounter Discharge Information Details Patient Name: [name] Date of Service: 07/27/2018 8:45 AM Medical Record Number: [id number] Patient Account Number: [id number] Date of Birth/Sex: 1956-08-20 (62 y.o. F) Treating RN: Cornell Barman Primary Care Takeyla Million: Rory Percy Other Clinician: Referring Davelyn Gwinn: Referral, Self Treating [name]/Extender: Tito Dine in Treatment: 0 Encounter Discharge Information Items Post Procedure Vitals Discharge Condition: Stable Temperature (F): 98.0 Ambulatory Status: Ambulatory Pulse (bpm): 68 Discharge Destination: Home Respiratory Rate (breaths/min): 16 Transportation: Private Auto Blood Pressure (mmHg): 128/60 Accompanied By: self Schedule Follow-up Appointment: Yes Clinical Summary of Care: Electronic Signature(s) Signed: 07/28/2018 7:26:08 AM By: Gretta Cool, BSN, RN, CWS, Kim RN, BSN Entered By: Gretta Cool, BSN, RN, CWS, Kim on 07/27/2018 10:03:01 [name] ([id number]) -------------------------------------------------------------------------------- Lower Extremity Assessment Details Patient Name: [name] Date of Service: 07/27/2018 8:45 AM Medical Record Number: [id number] Patient Account Number: [id number] Date of Birth/Sex: 1956-08-01 (62 y.o. F) Treating RN: Montey Hora Primary Care Shantese Raven: Rory Percy Other Clinician: Referring Kemaya Dorner: Referral,  Self Treating [name]/Extender: Tito Dine in Treatment: 0 Edema Assessment Assessed: [Left: No] [Right: No] Edema: [Left: N] [Right: o] Vascular Assessment Pulses: Dorsalis Pedis Palpable: [Right:Yes] Doppler Audible: [Right:Yes] Posterior Tibial Palpable: [Right:Yes] Doppler Audible: [Right:Yes] Extremity colors, hair growth, and conditions: Extremity Color: [Right:Normal] Hair Growth on Extremity: [Right:Yes] Temperature of Extremity: [Right:Warm] Capillary Refill: [Right:< 3 seconds] Blood Pressure: Brachial: [Right:124] Dorsalis Pedis: [Left:Dorsalis Pedis: 142] Ankle: Posterior Tibial: [Left:Posterior Tibial: 146] [Right:1.18] Toe Nail Assessment Left: Right: Thick: No Discolored: No Deformed: No Improper Length and Hygiene: No Electronic Signature(s) Signed: 07/27/2018 5:41:21 PM By: Montey Hora Entered By: Montey Hora on 07/27/2018 09:33:46 Anderson, Sally Jumbo ([id number]) -------------------------------------------------------------------------------- Multi Wound Chart Details Patient Name: [name] Date of Service: 07/27/2018 8:45 AM Medical Record Number: [id number] Patient Account Number: [id number] Date of Birth/Sex: 1956-06-28 (62 y.o. F) Treating RN: Cornell Barman Primary Care Jelitza Manninen: Rory Percy Other Clinician: Referring Cherokee Boccio: Referral, Self Treating Traniya Prichett/Extender: Tito Dine in Treatment: 0 Vital Signs Height(in): 66 Pulse(bpm): 127 Weight(lbs): 120 Blood Pressure(mmHg): 101/83 Body Mass Index(BMI): 19 Temperature(F): 98.2 Respiratory Rate 18 (breaths/min): Photos: [N/A:N/A] Wound Location: Right Lower Leg - Anterior N/A N/A Wounding Event: Surgical Injury N/A N/A Primary Etiology: Open Surgical Wound N/A N/A Date Acquired: 05/23/2018 N/A N/A Weeks of Treatment: 0 N/A N/A Wound Status: Open N/A N/A Measurements L x W x D 0.6x0.3x0.1 N/A N/A (cm) Area (cm) : 0.141 N/A N/A Volume (cm) : 0.014  N/A N/A % Reduction in Area: 0.00% N/A N/A % Reduction in Volume: 0.00% N/A N/A Classification: Full Thickness Without N/A N/A Exposed Support Structures Exudate Amount: Small N/A N/A Exudate Type: Serous N/A N/A Exudate Color: amber N/A N/A Wound Margin: Flat and Intact N/A N/A Granulation Amount: Medium (34-66%) N/A N/A Granulation Quality: Pink N/A N/A Necrotic Amount: Medium (34-66%) N/A N/A Exposed Structures: Fat Layer (Subcutaneous N/A N/A Tissue) Exposed: Yes Fascia: No Tendon: No Muscle: No Joint: No Bone: No Epithelialization: None N/A N/A Anderson, Sally A. ([id number]) Debridement: Debridement - Selective/Open N/A N/A Wound Pre-procedure 09:50 N/A N/A Verification/Time Out Taken: Pain Control: Lidocaine N/A N/A Tissue Debrided: Slough N/A N/A Level: Non-Viable Tissue N/A N/A Debridement Area (sq cm): 0.06 N/A N/A Instrument: Curette N/A N/A Bleeding: Minimum N/A N/A Hemostasis Achieved: Pressure N/A N/A Debridement Treatment Procedure was tolerated  well N/A N/A Response: Post Debridement 0.6x0.3x0.2 N/A N/A Measurements L x W x D (cm) Post Debridement Volume: 0.028 N/A N/A (cm) Periwound Skin Texture: Excoriation: No N/A N/A Induration: No Callus: No Crepitus: No Rash: No Scarring: No Periwound Skin Moisture: Maceration: No N/A N/A Dry/Scaly: No Periwound Skin Color: Erythema: Yes N/A N/A Atrophie Blanche: No Cyanosis: No Ecchymosis: No Hemosiderin Staining: No Mottled: No Pallor: No Rubor: No Erythema Location: Circumferential N/A N/A Temperature: No Abnormality N/A N/A Tenderness on Palpation: Yes N/A N/A Wound Preparation: Ulcer Cleansing: N/A N/A Rinsed/Irrigated with Saline Topical Anesthetic Applied: Other: lidocaine 4% Procedures Performed: Debridement N/A N/A Treatment Notes Wound #1 (Right, Anterior Lower Leg) Notes triple antibiotic ointment with coveret Electronic Signature(s) Signed: 07/27/2018 5:32:05 PM By: Linton Ham  MD Entered By: Linton Ham on 07/27/2018 11:10:45 [name] ([id number]) -------------------------------------------------------------------------------- Multi-Disciplinary Care Plan Details Patient Name: [name] Date of Service: 07/27/2018 8:45 AM Medical Record Number: [id number] Patient Account Number: [id number] Date of Birth/Sex: September 02, 1956 (62 y.o. F) Treating RN: Cornell Barman Primary Care Lazar Tierce: Rory Percy Other Clinician: Referring Camryn Quesinberry: Referral, Self Treating Corley Kohls/Extender: Tito Dine in Treatment: 0 Active Inactive Electronic Signature(s) Signed: 07/28/2018 7:26:08 AM By: Gretta Cool, BSN, RN, CWS, Kim RN, BSN Entered By: Gretta Cool, BSN, RN, CWS, Kim on 07/27/2018 09:46:34 [name] ([id number]) -------------------------------------------------------------------------------- Pain Assessment Details Patient Name: [name] Date of Service: 07/27/2018 8:45 AM Medical Record Number: [id number] Patient Account Number: [id number] Date of Birth/Sex: [birth year] (62 y.o. F) Treating RN: Cornell Barman Primary Care Odeal Welden: Rory Percy Other Clinician: Referring Johnny Gorter: Referral, Self Treating Jachelle Fluty/Extender: Tito Dine in Treatment: 0 Active Problems Location of Pain Severity and Description of Pain Patient Has Paino No Site Locations Pain Management and Medication Current Pain Management: Electronic Signature(s) Signed: 07/27/2018 4:40:44 PM By: Lorine Bears RCP, RRT, CHT Signed: 07/28/2018 7:26:08 AM By: Gretta Cool, BSN, RN, CWS, Kim RN, BSN Entered By: Lorine Bears on 07/27/2018 08:47:00 [name] ([id number]) -------------------------------------------------------------------------------- Patient/Caregiver Education Details Patient Name: [name] Date of Service: 07/27/2018 8:45 AM Medical Record Number: [id number] Patient Account Number: [id number] Date of Birth/Gender: 10-01-1955 (62  y.o. F) Treating RN: Cornell Barman Primary Care Physician: Rory Percy Other Clinician: Referring Physician: Referral, Self Treating Physician/Extender: Tito Dine in Treatment: 0 Education Assessment Education Provided To: Patient Education Topics Provided Wound/Skin Impairment: Handouts: Caring for Your Ulcer, Other: wound care as prescribed Methods: Demonstration, Explain/Verbal Responses: State content correctly Electronic Signature(s) Signed: 07/28/2018 7:26:08 AM By: Gretta Cool, BSN, RN, CWS, Kim RN, BSN Entered By: Gretta Cool, BSN, RN, CWS, Kim on 07/27/2018 10:00:56 [name] ([id number]) -------------------------------------------------------------------------------- Wound Assessment Details Patient Name: [name] Date of Service: 07/27/2018 8:45 AM Medical Record Number: [id number] Patient Account Number: [id number] Date of Birth/Sex: [birth year] (62 y.o. F) Treating RN: Montey Hora Primary Care Jachai Okazaki: Rory Percy Other Clinician: Referring Sonita Michiels: Referral, Self Treating Seven [place]/Extender: Tito Dine in Treatment: 0 Wound Status Wound Number: 1 Primary Etiology: Open Surgical Wound Wound Location: Right Lower Leg - Anterior Wound Status: Open Wounding Event: Surgical Injury Date Acquired: 05/23/2018 Weeks Of Treatment: 0 Clustered Wound: No Photos Wound Measurements Length: (cm) 0.6 Width: (cm) 0.3 Depth: (cm) 0.1 Area: (cm) 0.141 Volume: (cm) 0.014 % Reduction in Area: 0% % Reduction in Volume: 0% Epithelialization: None Tunneling: No Undermining: No Wound Description Full Thickness Without Exposed Support Classification: Structures Wound Margin: Flat and Intact Exudate Small Amount: Exudate Type: Serous Exudate Color: amber Foul  Odor After Cleansing: No Slough/Fibrino Yes Wound Bed Granulation Amount: Medium (34-66%) Exposed Structure Granulation Quality: Pink Fascia Exposed: No Necrotic Amount: Medium  (34-66%) Fat Layer (Subcutaneous Tissue) Exposed: Yes Necrotic Quality: Adherent Slough Tendon Exposed: No Muscle Exposed: No Joint Exposed: No Bone Exposed: No Periwound Skin Texture Anderson, Sally A. ([id number]) Texture Color No Abnormalities Noted: No No Abnormalities Noted: No Callus: No Atrophie Blanche: No Crepitus: No Cyanosis: No Excoriation: No Ecchymosis: No Induration: No Erythema: Yes Rash: No Erythema Location: Circumferential Scarring: No Hemosiderin Staining: No Mottled: No Moisture Pallor: No No Abnormalities Noted: No Rubor: No Dry / Scaly: No Maceration: No Temperature / Pain Temperature: No Abnormality Tenderness on Palpation: Yes Wound Preparation Ulcer Cleansing: Rinsed/Irrigated with Saline Topical Anesthetic Applied: Other: lidocaine 4%, Treatment Notes Wound #1 (Right, Anterior Lower Leg) Notes triple antibiotic ointment with coveret Electronic Signature(s) Signed: 07/27/2018 9:41:23 AM By: Montey Hora Entered By: Montey Hora on 07/27/2018 09:41:22 [name] ([id number]) -------------------------------------------------------------------------------- Vitals Details Patient Name: [name] Date of Service: 07/27/2018 8:45 AM Medical Record Number: [id number] Patient Account Number: [id number] Date of Birth/Sex: 09-Jun-1956 (62 y.o. F) Treating RN: Montey Hora Primary Care Zaylan Kissoon: Rory Percy Other Clinician: Referring Terrilyn Tyner: Referral, Self Treating Ingra Rother/Extender: Tito Dine in Treatment: 0 Vital Signs Time Taken: 09:14 Temperature (F): 98.2 Height (in): 66 Pulse (bpm): 127 Source: Measured Respiratory Rate (breaths/min): 18 Weight (lbs): 120 Blood Pressure (mmHg): 101/83 Source: Measured Reference Range: 80 - 120 mg / dl Body Mass Index (BMI): 19.4 Electronic Signature(s) Signed: 07/27/2018 5:41:21 PM By: Montey Hora Entered By: Montey Hora on 07/27/2018 09:16:19

## 2018-07-30 NOTE — Progress Notes (Signed)
GINI, CAPUTO ([id number]) Visit Report for 07/27/2018 Chief Complaint Document Details Patient Name: [name], [name] Date of Service: 07/27/2018 8:45 AM Medical Record Number: [id number] Patient Account Number: [id number] Date of Birth/Sex: 04-15-1956 (62 y.o. F) Treating RN: Cornell Barman Primary Care Provider: Rory Percy Other Clinician: Referring Provider: Referral, Self Treating Provider/Extender: Tito Dine in Treatment: 0 Information Obtained from: Patient Chief Complaint 07/27/18; patient is here for review of a surgical wound on the right anterior tibia Electronic Signature(s) Signed: 07/27/2018 5:32:05 PM By: Linton Ham MD Entered By: Linton Ham on 07/27/2018 11:14:59 [name] ([id number]) -------------------------------------------------------------------------------- Debridement Details Patient Name: [name] Date of Service: 07/27/2018 8:45 AM Medical Record Number: [id number] Patient Account Number: [id number] Date of Birth/Sex: 09-18-1956 (62 y.o. F) Treating RN: Cornell Barman Primary Care Provider: Rory Percy Other Clinician: Referring Provider: Referral, Self Treating Provider/Extender: Tito Dine in Treatment: 0 Debridement Performed for Wound #1 Right,Anterior Lower Leg Assessment: Performed By: Physician Ricard Dillon, MD Debridement Type: Debridement Level of Consciousness (Pre- Awake and Alert procedure): Pre-procedure Verification/Time Yes - 09:50 Out Taken: Start Time: 09:50 Pain Control: Lidocaine Total Area Debrided (L x W): 0.2 (cm) x 0.3 (cm) = 0.06 (cm) Tissue and other material Non-Viable, Slough, Slough debrided: Level: Non-Viable Tissue Debridement Description: Selective/Open Wound Instrument: Curette Bleeding: Minimum Hemostasis Achieved: Pressure End Time: 09:55 Response to Treatment: Procedure was tolerated well Level of Consciousness Awake and Alert (Post-procedure): Post Debridement  Measurements of Total Wound Length: (cm) 0.6 Width: (cm) 0.3 Depth: (cm) 0.2 Volume: (cm) 0.028 Character of Wound/Ulcer Post Debridement: Stable Post Procedure Diagnosis Same as Pre-procedure Electronic Signature(s) Signed: 07/27/2018 5:32:05 PM By: Linton Ham MD Signed: 07/28/2018 7:26:08 AM By: Gretta Cool, BSN, RN, CWS, Kim RN, BSN Entered By: Linton Ham on 07/27/2018 11:11:09 [name] ([id number]) -------------------------------------------------------------------------------- HPI Details Patient Name: [name] Date of Service: 07/27/2018 8:45 AM Medical Record Number: [id number] Patient Account Number: [id number] Date of Birth/Sex: August 15, 1956 (62 y.o. F) Treating RN: Cornell Barman Primary Care Provider: Rory Percy Other Clinician: Referring Provider: Referral, Self Treating Provider/Extender: Tito Dine in Treatment: 0 History of Present Illness HPI Description: ADMISSION 07/27/18 This is a reasonably healthy 62 year old woman who lives in [place]. She cares for a disabled son with cerebral palsy and has parents in [place] who she is also the primary caregiver for. She gives a history of having a hyperkeratotic raised area for maybe as long as 1 or 2 years on the right anterior tibia. She was seen by her primary doctor by now we do not think this was anything serious. She also really came to the attention of Dr. Nevada Crane who is dermatology in [place] and apparently underwent liquid nitrogen therapy on 2 different occasions but the lesion Coming Back. At [facility] Her Primary Doctor Did a Shave Biopsy That Showed Atypia and Ultimately Another Biopsy That Showed Squamous Cell Carcinoma. She was referred to the wound care center in [place] which apparently is being managed by Dr. Anthony Sar Who is a Education officer, environmental. He excised this area on 05/31/18 with pathology showing squamous cell carcinoma with extensive ulceration. The tumor appeared  to be completely excised. The patient states that she had problems almost immediately after with swelling of her foot and was in the ER. One week later she had have the stitches removed and then 2 weeks later all the stitches were removed. She showed me a picture from this timeframe and perhaps there was  a tiny open area in the mid part of the surgical wound. Apparently this is never really closed and the patient is frustrated she has not gotten specific instructions on how to deal with this. More recently she has a small raised area above the original surgical area that is tender looks like a small pimple although there is no pustular head to it. The patient does not have a history of any other skin lesions or skin cancers. Electronic Signature(s) Signed: 07/27/2018 5:32:05 PM By: Linton Ham MD Entered By: Linton Ham on 07/27/2018 11:19:41 [name] ([id number]) -------------------------------------------------------------------------------- Physical Exam Details Patient Name: [name] Date of Service: 07/27/2018 8:45 AM Medical Record Number: [id number] Patient Account Number: [id number] Date of Birth/Sex: July 12, 1956 (62 y.o. F) Treating RN: Cornell Barman Primary Care Provider: Rory Percy Other Clinician: Referring Provider: Referral, Self Treating Provider/Extender: Tito Dine in Treatment: 0 Constitutional Sitting or standing Blood Pressure is within target range for patient.. Pulse regular and within target range for patient.Marland Kitchen Respirations regular, non-labored and within target range.. Temperature is normal and within the target range for the patient.Marland Kitchen appears in no distress. Eyes Conjunctivae clear. No discharge. Respiratory Respiratory effort is easy and symmetric bilaterally. Rate is normal at rest and on room air.. Cardiovascular Femoral arteries without bruits and pulses strong.. Pedal pulses palpable and strong bilaterally.. Lymphatic None  palpable in the right popliteal or inguinal area. Integumentary (Hair, Skin) I did not appreciate any other skin lesions. Psychiatric Patient appears very anxious. Notes Wound exam; right anterior tibia lower mid aspect. Surgical wound appears well approximated except for a small area in the middle. There is some surface slough over the remaining area that was removed with a #3 curet. Underneath this is a clean healthy wound base but with roughly 2 mm in depth the wound is small. No evidence of surrounding infection. oSuperiorly as small raised area that I opened with the curette. There was serosanguineous discharge but no convincing evidence of infection. Electronic Signature(s) Signed: 07/27/2018 5:32:05 PM By: Linton Ham MD Entered By: Linton Ham on 07/27/2018 11:21:38 [name] ([id number]) -------------------------------------------------------------------------------- Physician Orders Details Patient Name: [name] Date of Service: 07/27/2018 8:45 AM Medical Record Number: [id number] Patient Account Number: [id number] Date of Birth/Sex: 1955-11-15 (62 y.o. F) Treating RN: Cornell Barman Primary Care Provider: Rory Percy Other Clinician: Referring Provider: Referral, Self Treating Provider/Extender: Tito Dine in Treatment: 0 Verbal / Phone Orders: No Diagnosis Coding Anesthetic (add to Medication List) Wound #1 Right,Anterior Lower Leg o Topical Lidocaine 4% cream applied to wound bed prior to debridement (In Clinic Only). Primary Wound Dressing o Other: - Triple Antibiotic Ointment Secondary Dressing Wound #1 Right,Anterior Lower Leg o Other - Coverlet Dressing Change Frequency Wound #1 Right,Anterior Lower Leg o Change dressing every day. Follow-up Appointments Wound #1 Right,Anterior Lower Leg o Return Appointment in 2 weeks. Medications-please add to medication list. Wound #1 Right,Anterior Lower Leg o Topical Antibiotic -  Covered with coverlet Bandage Electronic Signature(s) Signed: 07/27/2018 5:32:05 PM By: Linton Ham MD Signed: 07/28/2018 7:26:08 AM By: Gretta Cool, BSN, RN, CWS, Kim RN, BSN Entered By: Gretta Cool, BSN, RN, CWS, Kim on 07/27/2018 10:00:12 [name] ([id number]) -------------------------------------------------------------------------------- Problem List Details Patient Name: [name] Date of Service: 07/27/2018 8:45 AM Medical Record Number: [id number] Patient Account Number: [id number] Date of Birth/Sex: [birth year] (62 y.o. F) Treating RN: Cornell Barman Primary Care Provider: Rory Percy Other Clinician: Referring Provider: Referral, Self Treating Provider/Extender: Ricard Dillon  Weeks in Treatment: 0 Active Problems ICD-10 Evaluated Encounter Code Description Active Date Today Diagnosis T81.31XD Disruption of external operation (surgical) wound, not 07/27/2018 No Yes elsewhere classified, subsequent encounter L97.211 Non-pressure chronic ulcer of right calf limited to breakdown 07/27/2018 No Yes of skin C44.722 Squamous cell carcinoma of skin of right lower limb, including 07/27/2018 No Yes hip Inactive Problems Resolved Problems Electronic Signature(s) Signed: 07/27/2018 5:32:05 PM By: Linton Ham MD Entered By: Linton Ham on 07/27/2018 11:04:20 [name] ([id number]) -------------------------------------------------------------------------------- Progress Note Details Patient Name: [name] Date of Service: 07/27/2018 8:45 AM Medical Record Number: [id number] Patient Account Number: [id number] Date of Birth/Sex: 31-Jan-1956 (62 y.o. F) Treating RN: Cornell Barman Primary Care Provider: Rory Percy Other Clinician: Referring Provider: Referral, Self Treating Provider/Extender: Tito Dine in Treatment: 0 Subjective Chief Complaint Information obtained from Patient 07/27/18; patient is here for review of a surgical wound on the right  anterior tibia History of Present Illness (HPI) ADMISSION 07/27/18 This is a reasonably healthy 62 year old woman who lives in [place]. She cares for a disabled son with cerebral palsy and has parents in [place] who she is also the primary caregiver for. She gives a history of having a hyperkeratotic raised area for maybe as long as 1 or 2 years on the right anterior tibia. She was seen by her primary doctor by now we do not think this was anything serious. She also really came to the attention of Dr. [place] Crane who is dermatology in [place] and apparently underwent liquid nitrogen therapy on 2 different occasions but the lesion Coming Back. At [facility] Her Primary Doctor Did a Shave Biopsy That Showed Atypia and Ultimately Another Biopsy That Showed Squamous Cell Carcinoma. She was referred to the wound care center in [place] which apparently is being managed by Dr. Anthony Sar Who is a Education officer, environmental. He excised this area on 05/31/18 with pathology showing squamous cell carcinoma with extensive ulceration. The tumor appeared to be completely excised. The patient states that she had problems almost immediately after with swelling of her foot and was in the ER. One week later she had have the stitches removed and then 2 weeks later all the stitches were removed. She showed me a picture from this timeframe and perhaps there was a tiny open area in the mid part of the surgical wound. Apparently this is never really closed and the patient is frustrated she has not gotten specific instructions on how to deal with this. More recently she has a small raised area above the original surgical area that is tender looks like a small pimple although there is no pustular head to it. The patient does not have a history of any other skin lesions or skin cancers. Wound History Patient presents with 1 open wound that has been present for approximately 8 weeks. Laboratory tests have not  been performed in the last month. Patient reportedly has not tested positive for an antibiotic resistant organism. Patient reportedly has not tested positive for osteomyelitis. Patient reportedly has not had testing performed to evaluate circulation in the legs. Patient History Information obtained from Patient. Allergies Keflex, Bactrim, hydrocodone Family History Cancer - Father, Heart Disease - Father, Hypertension - Father, Stroke - Father, No family history of Diabetes, Hereditary Spherocytosis, Kidney Disease, Lung Disease, Seizures, Thyroid Problems, Tuberculosis. Social History Never smoker, Marital Status - Married, Alcohol Use - Never, Drug Use - No History, Caffeine Use - Never. FOREVER, ARECHIGA ([id number]) Medical History Eyes Denies  history of Cataracts, Glaucoma, Optic Neuritis Ear/Nose/Mouth/Throat Denies history of Chronic sinus problems/congestion, Middle ear problems Hematologic/Lymphatic Denies history of Anemia, Hemophilia, Human Immunodeficiency Virus, Lymphedema, Sickle Cell Disease Respiratory Denies history of Aspiration, Asthma, Chronic Obstructive Pulmonary Disease (COPD), Pneumothorax, Sleep Apnea, Tuberculosis Cardiovascular Denies history of Angina, Arrhythmia, Congestive Heart Failure, Coronary Artery Disease, Deep Vein Thrombosis, Hypertension, Hypotension, Myocardial Infarction, Peripheral Arterial Disease, Peripheral Venous Disease, Phlebitis, Vasculitis Gastrointestinal Denies history of Cirrhosis , Colitis, Crohn s, Hepatitis A, Hepatitis B, Hepatitis C Endocrine Denies history of Type I Diabetes, Type II Diabetes Genitourinary Denies history of End Stage Renal Disease Immunological Denies history of Lupus Erythematosus, Raynaud s, Scleroderma Integumentary (Skin) Denies history of History of Burn, History of pressure wounds Musculoskeletal Denies history of Gout, Rheumatoid Arthritis, Osteoarthritis, Osteomyelitis Neurologic Denies history of  Dementia, Neuropathy, Quadriplegia, Paraplegia, Seizure Disorder Oncologic Denies history of Received Chemotherapy, Received Radiation Psychiatric Denies history of Anorexia/bulimia, Confinement Anxiety Medical And Surgical History Notes Oncologic squamous cell on RLE Review of Systems (ROS) Constitutional Symptoms (General Health) Denies complaints or symptoms of Fatigue, Fever, Chills, Marked Weight Change. Eyes Complains or has symptoms of Glasses / Contacts - glasses. Denies complaints or symptoms of Dry Eyes, Vision Changes. Ear/Nose/Mouth/Throat Denies complaints or symptoms of Difficult clearing ears, Sinusitis. Hematologic/Lymphatic Denies complaints or symptoms of Bleeding / Clotting Disorders, Human Immunodeficiency Virus. Respiratory Denies complaints or symptoms of Chronic or frequent coughs, Shortness of Breath. Cardiovascular Denies complaints or symptoms of Chest pain, LE edema. Gastrointestinal Denies complaints or symptoms of Frequent diarrhea, Nausea, Vomiting. Endocrine Denies complaints or symptoms of Hepatitis, Thyroid disease, Polydypsia (Excessive Thirst). Genitourinary Denies complaints or symptoms of Kidney failure/ Dialysis, Incontinence/dribbling. Immunological Denies complaints or symptoms of Hives, Itching. Integumentary (Skin) Ribaudo, Saraia A. ([id number]) Complains or has symptoms of Wounds. Denies complaints or symptoms of Bleeding or bruising tendency, Breakdown, Swelling. Musculoskeletal Denies complaints or symptoms of Muscle Pain, Muscle Weakness. Neurologic Denies complaints or symptoms of Numbness/parasthesias, Focal/Weakness. Psychiatric Denies complaints or symptoms of Anxiety, Claustrophobia. Objective Constitutional Sitting or standing Blood Pressure is within target range for patient.. Pulse regular and within target range for patient.Marland Kitchen Respirations regular, non-labored and within target range.. Temperature is normal and within the  target range for the patient.Marland Kitchen appears in no distress. Vitals Time Taken: 9:14 AM, Height: 66 in, Source: Measured, Weight: 120 lbs, Source: Measured, BMI: 19.4, Temperature: 98.2 F, Pulse: 127 bpm, Respiratory Rate: 18 breaths/min, Blood Pressure: 101/83 mmHg. Eyes Conjunctivae clear. No discharge. Respiratory Respiratory effort is easy and symmetric bilaterally. Rate is normal at rest and on room air.. Cardiovascular Femoral arteries without bruits and pulses strong.. Pedal pulses palpable and strong bilaterally.. Lymphatic None palpable in the right popliteal or inguinal area. Psychiatric Patient appears very anxious. General Notes: Wound exam; right anterior tibia lower mid aspect. Surgical wound appears well approximated except for a small area in the middle. There is some surface slough over the remaining area that was removed with a #3 curet. Underneath this is a clean healthy wound base but with roughly 2 mm in depth the wound is small. No evidence of surrounding infection. Superiorly as small raised area that I opened with the curette. There was serosanguineous discharge but no convincing evidence of infection. Integumentary (Hair, Skin) I did not appreciate any other skin lesions. Wound #1 status is Open. Original cause of wound was Surgical Injury. The wound is located on the Right,Anterior Lower Leg. The wound measures 0.6cm length x 0.3cm width x 0.1cm depth; 0.141cm^2 area and  0.014cm^3 volume. There is Fat Layer (Subcutaneous Tissue) Exposed exposed. There is no tunneling or undermining noted. There is a small amount of serous drainage noted. The wound margin is flat and intact. There is medium (34-66%) pink granulation within the wound bed. There is a medium (34-66%) amount of necrotic tissue within the wound bed including Adherent Slough. The periwound skin appearance exhibited: Erythema. The periwound skin appearance did not exhibit: Callus, Crepitus,  Excoriation, Pickart, Lise A. ([id number]) Induration, Rash, Scarring, Dry/Scaly, Maceration, Atrophie Blanche, Cyanosis, Ecchymosis, Hemosiderin Staining, Mottled, Pallor, Rubor. The surrounding wound skin color is noted with erythema which is circumferential. Periwound temperature was noted as No Abnormality. The periwound has tenderness on palpation. Assessment Active Problems ICD-10 Disruption of external operation (surgical) wound, not elsewhere classified, subsequent encounter Non-pressure chronic ulcer of right calf limited to breakdown of skin Squamous cell carcinoma of skin of right lower limb, including hip Procedures Wound #1 Pre-procedure diagnosis of Wound #1 is an Open Surgical Wound located on the Right,Anterior Lower Leg . There was a Selective/Open Wound Non-Viable Tissue Debridement with a total area of 0.06 sq cm performed by Ricard Dillon, MD. With the following instrument(s): Curette to remove Non-Viable tissue/material. Material removed includes [facility] after achieving pain control using Lidocaine. No specimens were taken. A time out was conducted at 09:50, prior to the start of the procedure. A Minimum amount of bleeding was controlled with Pressure. The procedure was tolerated well. Post Debridement Measurements: 0.6cm length x 0.3cm width x 0.2cm depth; 0.028cm^3 volume. Character of Wound/Ulcer Post Debridement is stable. Post procedure Diagnosis Wound #1: Same as Pre-Procedure Plan Anesthetic (add to Medication List): Wound #1 Right,Anterior Lower Leg: Topical Lidocaine 4% cream applied to wound bed prior to debridement (In Clinic Only). Primary Wound Dressing: Other: - Triple Antibiotic Ointment Secondary Dressing: Wound #1 Right,Anterior Lower Leg: Other - Coverlet Dressing Change Frequency: Wound #1 Right,Anterior Lower Leg: Change dressing every day. Follow-up Appointments: Wound #1 Right,Anterior Lower Leg: Return Appointment in 2  weeks. Medications-please add to medication list.: Wound #1 Right,Anterior Lower Leg: Topical Antibiotic - Covered with coverlet Bandage Brackins, Shonteria A. ([id number]) #1 superficial small wound in the mid aspect of her original surgical incision which was done for squamous cell carcinoma in early September #2 for now I just recommended topical antibiotic ointment if this doesn't close in 2 weeks THIS to sober collagen #3 she had a small abscess above this that I opened there was no purulence there is serosanguineous drainage and this seemed to relieve the problem. I'll need to watch this area as well #4 per the pathology the patient had a complete excision. Electronic Signature(s) Signed: 07/27/2018 5:32:05 PM By: Linton Ham MD Entered By: Linton Ham on 07/27/2018 11:22:44 [name] ([id number]) -------------------------------------------------------------------------------- ROS/PFSH Details Patient Name: [name] Date of Service: 07/27/2018 8:45 AM Medical Record Number: [id number] Patient Account Number: [id number] Date of Birth/Sex: 25-Nov-1955 (62 y.o. F) Treating RN: Montey Hora Primary Care Provider: Rory Percy Other Clinician: Referring Provider: Referral, Self Treating Provider/Extender: Tito Dine in Treatment: 0 Information Obtained From Patient Wound History Do you currently have one or more open woundso Yes How many open wounds do you currently haveo 1 Approximately how long have you had your woundso 8 weeks Has your wound(s) ever healed and then re-openedo No Have you had any lab work done in the past montho No Have you tested positive for an antibiotic resistant organism (MRSA, VRE)o No Have you tested positive for  osteomyelitis (bone infection)o No Have you had any tests for circulation on your legso No Constitutional Symptoms (General Health) Complaints and Symptoms: Negative for: Fatigue; Fever; Chills; Marked Weight  Change Eyes Complaints and Symptoms: Positive for: Glasses / Contacts - glasses Negative for: Dry Eyes; Vision Changes Medical History: Negative for: Cataracts; Glaucoma; Optic Neuritis Ear/Nose/Mouth/Throat Complaints and Symptoms: Negative for: Difficult clearing ears; Sinusitis Medical History: Negative for: Chronic sinus problems/congestion; Middle ear problems Hematologic/Lymphatic Complaints and Symptoms: Negative for: Bleeding / Clotting Disorders; Human Immunodeficiency Virus Medical History: Negative for: Anemia; Hemophilia; Human Immunodeficiency Virus; Lymphedema; Sickle Cell Disease Respiratory Complaints and Symptoms: Negative for: Chronic or frequent coughs; Shortness of Breath Medical History: Negative for: Aspiration; Asthma; Chronic Obstructive Pulmonary Disease (COPD); Pneumothorax; Sleep Apnea; Trickey, Solana A. ([id number]) Tuberculosis Cardiovascular Complaints and Symptoms: Negative for: Chest pain; LE edema Medical History: Negative for: Angina; Arrhythmia; Congestive Heart Failure; Coronary Artery Disease; Deep Vein Thrombosis; Hypertension; Hypotension; Myocardial Infarction; Peripheral Arterial Disease; Peripheral Venous Disease; Phlebitis; Vasculitis Gastrointestinal Complaints and Symptoms: Negative for: Frequent diarrhea; Nausea; Vomiting Medical History: Negative for: Cirrhosis ; Colitis; Crohnos; Hepatitis A; Hepatitis B; Hepatitis C Endocrine Complaints and Symptoms: Negative for: Hepatitis; Thyroid disease; Polydypsia (Excessive Thirst) Medical History: Negative for: Type I Diabetes; Type II Diabetes Genitourinary Complaints and Symptoms: Negative for: Kidney failure/ Dialysis; Incontinence/dribbling Medical History: Negative for: End Stage Renal Disease Immunological Complaints and Symptoms: Negative for: Hives; Itching Medical History: Negative for: Lupus Erythematosus; Raynaudos; Scleroderma Integumentary (Skin) Complaints and  Symptoms: Positive for: Wounds Negative for: Bleeding or bruising tendency; Breakdown; Swelling Medical History: Negative for: History of Burn; History of pressure wounds Musculoskeletal Complaints and Symptoms: Negative for: Muscle Pain; Muscle Weakness Medical History: NATILIE, KRABBENHOFT A. ([id number]) Negative for: Gout; Rheumatoid Arthritis; Osteoarthritis; Osteomyelitis Neurologic Complaints and Symptoms: Negative for: Numbness/parasthesias; Focal/Weakness Medical History: Negative for: Dementia; Neuropathy; Quadriplegia; Paraplegia; Seizure Disorder Psychiatric Complaints and Symptoms: Negative for: Anxiety; Claustrophobia Medical History: Negative for: Anorexia/bulimia; Confinement Anxiety Oncologic Medical History: Negative for: Received Chemotherapy; Received Radiation Past Medical History Notes: squamous cell on RLE Immunizations Pneumococcal Vaccine: Received Pneumococcal Vaccination: Yes Implantable Devices Family and Social History Cancer: Yes - Father; Diabetes: No; Heart Disease: Yes - Father; Hereditary Spherocytosis: No; Hypertension: Yes - Father; Kidney Disease: No; Lung Disease: No; Seizures: No; Stroke: Yes - Father; Thyroid Problems: No; Tuberculosis: No; Never smoker; Marital Status - Married; Alcohol Use: Never; Drug Use: No History; Caffeine Use: Never; Financial Concerns: No; Food, Clothing or Shelter Needs: No; Support System Lacking: No; Transportation Concerns: No; Advanced Directives: No; Patient does not want information on Administrator) Signed: 07/27/2018 5:32:05 PM By: Linton Ham MD Signed: 07/27/2018 5:41:21 PM By: Montey Hora Entered By: Montey Hora on 07/27/2018 09:26:43 [name] ([id number]) -------------------------------------------------------------------------------- SuperBill Details Patient Name: [name] Date of Service: 07/27/2018 Medical Record Number: [id number] Patient Account  Number: [id number] Date of Birth/Sex: [birth year] (62 y.o. F) Treating RN: Cornell Barman Primary Care Provider: Rory Percy Other Clinician: Referring Provider: Referral, Self Treating Provider/Extender: Tito Dine in Treatment: 0 Diagnosis Coding ICD-10 Codes Code Description T81.31XD Disruption of external operation (surgical) wound, not elsewhere classified, subsequent encounter L97.211 Non-pressure chronic ulcer of right calf limited to breakdown of skin C44.722 Squamous cell carcinoma of skin of right lower limb, including hip Facility Procedures CPT4 Code: 16073710 Description: 62694 - WOUND CARE VISIT-LEV 2 EST PT Modifier: Quantity: 1 CPT4 Code: 85462703 Description: 97597 - DEBRIDE WOUND 1ST 20 SQ CM OR < ICD-10 Diagnosis Description L97.211  Non-pressure chronic ulcer of right calf limited to breakdown Modifier: of skin Quantity: 1 Physician Procedures CPT4: Description Modifier Quantity Code 3875643 32951 - WC PHYS LEVEL 2 - NEW PT 25 1 ICD-10 Diagnosis Description L97.211 Non-pressure chronic ulcer of right calf limited to breakdown of skin T81.31XD Disruption of external operation (surgical) wound,  not elsewhere classified, subsequent encounter C44.722 Squamous cell carcinoma of skin of right lower limb, including hip CPT4: 8841660 63016 - WC PHYS DEBR WO ANESTH 20 SQ CM 1 ICD-10 Diagnosis Description L97.211 Non-pressure chronic ulcer of right calf limited to breakdown of skin Electronic Signature(s) Signed: 07/27/2018 5:32:05 PM By: Linton Ham MD Entered By: Linton Ham on 07/27/2018 11:23:33

## 2018-08-04 ENCOUNTER — Encounter: Payer: BC Managed Care – PPO | Attending: Physician Assistant

## 2018-08-04 DIAGNOSIS — L97211 Non-pressure chronic ulcer of right calf limited to breakdown of skin: Secondary | ICD-10-CM | POA: Insufficient documentation

## 2018-08-04 DIAGNOSIS — C44722 Squamous cell carcinoma of skin of right lower limb, including hip: Secondary | ICD-10-CM | POA: Insufficient documentation

## 2018-08-04 DIAGNOSIS — X58XXXA Exposure to other specified factors, initial encounter: Secondary | ICD-10-CM | POA: Insufficient documentation

## 2018-08-04 DIAGNOSIS — T8131XD Disruption of external operation (surgical) wound, not elsewhere classified, subsequent encounter: Secondary | ICD-10-CM | POA: Insufficient documentation

## 2018-08-06 NOTE — Progress Notes (Signed)
[name], [name] ([id number]) Visit Report for 08/04/2018 Physician Orders Details Patient Name: [name], [name] Date of Service: 08/04/2018 11:15 AM Medical Record Number: [id number] Patient Account Number: [id number] Date of Birth/Sex: 1956/06/23 (62 y.o. F) Treating RN: Cornell Barman Primary Care Provider: Rory Percy Other Clinician: Referring Provider: Rory Percy Treating Provider/Extender: Melburn Hake, HOYT Weeks in Treatment: 1 Verbal / Phone Orders: No Diagnosis Coding Anesthetic (add to Medication List) Wound #1 Right,Anterior Lower Leg o Topical Lidocaine 4% cream applied to wound bed prior to debridement (In Clinic Only). Primary Wound Dressing o Other: - Triple Antibiotic Ointment Secondary Dressing Wound #1 Right,Anterior Lower Leg o Other - Coverlet Dressing Change Frequency Wound #1 Right,Anterior Lower Leg o Change dressing every day. Follow-up Appointments Wound #1 Right,Anterior Lower Leg o Return Appointment in 2 weeks. o Nurse Visit as needed Medications-please add to medication list. Wound #1 Right,Anterior Lower Leg o Topical Antibiotic - Covered with coverlet Bandage Electronic Signature(s) Signed: 08/04/2018 5:47:28 PM By: Gretta Cool, BSN, RN, CWS, Kim RN, BSN Signed: 08/04/2018 5:47:48 PM By: Worthy Keeler PA-C Entered By: Gretta Cool, BSN, RN, CWS, Kim on 08/04/2018 11:24:16 [name] ([id number]) -------------------------------------------------------------------------------- SuperBill Details Patient Name: [name] Date of Service: 08/04/2018 Medical Record Number: [id number] Patient Account Number: [id number] Date of Birth/Sex: 12-17-1955 (62 y.o. F) Treating RN: Cornell Barman Primary Care Provider: Rory Percy Other Clinician: Referring Provider: Rory Percy Treating Provider/Extender: Melburn Hake, HOYT Weeks in Treatment: 1 Diagnosis Coding ICD-10 Codes Code Description T81.31XD Disruption of external operation (surgical) wound, not  elsewhere classified, subsequent encounter L97.211 Non-pressure chronic ulcer of right calf limited to breakdown of skin C44.722 Squamous cell carcinoma of skin of right lower limb, including hip Facility Procedures CPT4 Code: 02409735 Description: 32992 - WOUND CARE VISIT-LEV 2 EST PT Modifier: Quantity: 1 Electronic Signature(s) Signed: 08/04/2018 5:45:57 PM By: Gretta Cool, BSN, RN, CWS, Kim RN, BSN Signed: 08/04/2018 5:47:48 PM By: Worthy Keeler PA-C Entered By: Gretta Cool, BSN, RN, CWS, Kim on 08/04/2018 17:45:57

## 2018-08-06 NOTE — Progress Notes (Signed)
[name], [name] ([id number]) Visit Report for 08/04/2018 Arrival Information Details Patient Name: [name] Date of Service: 08/04/2018 11:15 AM Medical Record Number: [id number] Patient Account Number: [id number] Date of Birth/Sex: Jun 10, 1956 (62 y.o. F) Treating RN: [name] Primary Care [name]: [name] Other Clinician: Referring [name]: [name] Treating [name]: [name], [name] in Treatment: 1 Visit Information History Since Last Visit Added or deleted any medications: No Patient Arrived: Ambulatory Any new allergies or adverse reactions: No Arrival Time: 11:20 Had a fall or experienced change in No Accompanied By: self activities of daily living that may affect Transfer Assistance: None risk of falls: Patient Identification Verified: Yes Signs or symptoms of abuse/neglect since No Secondary Verification Process Completed: Yes last visito Hospitalized since last visit: No Implantable device outside of the clinic No excluding cellular tissue based products placed in the center since last visit: Has Dressing in Place as Prescribed: Yes Pain Present Now: Unable to Respond Electronic Signature(s) Signed: 08/04/2018 5:47:28 PM By: [name], BSN, RN, CWS, [name] Entered By: [name], BSN, RN, CWS, Sally on 08/04/2018 11:21:01 [name] ([id number]) -------------------------------------------------------------------------------- Clinic Level of Care Assessment Details Patient Name: [name] Date of Service: 08/04/2018 11:15 AM Medical Record Number: [id number] Patient Account Number: [id number] Date of Birth/Sex: [birth year] (62 y.o. F) Treating RN: [name] Primary Care [name]: [name] Other Clinician: Referring [name]: [name] Treating [name]: [name], [name] in Treatment: 1 Clinic Level of Care Assessment Items TOOL 4 Quantity Score []  - Use when only an EandM is performed on FOLLOW-UP visit  0 ASSESSMENTS - Nursing Assessment / Reassessment []  - Reassessment of Co-morbidities (includes updates in patient status) 0 X- 1 5 Reassessment of Adherence to Treatment Plan ASSESSMENTS - Wound and Skin Assessment / Reassessment X - Simple Wound Assessment / Reassessment - one wound 1 5 []  - 0 Complex Wound Assessment / Reassessment - multiple wounds []  - 0 Dermatologic / Skin Assessment (not related to wound area) ASSESSMENTS - Focused Assessment []  - Circumferential Edema Measurements - multi extremities 0 []  - 0 Nutritional Assessment / Counseling / Intervention []  - 0 Lower Extremity Assessment (monofilament, tuning fork, pulses) []  - 0 Peripheral Arterial Disease Assessment (using hand held doppler) ASSESSMENTS - Ostomy and/or Continence Assessment and Care []  - Incontinence Assessment and Management 0 []  - 0 Ostomy Care Assessment and Management (repouching, etc.) PROCESS - Coordination of Care X - Simple Patient / Family Education for ongoing care 1 15 []  - 0 Complex (extensive) Patient / Family Education for ongoing care []  - 0 Staff obtains Programmer, systems, Records, Test Results / Process Orders []  - 0 Staff telephones HHA, Nursing Homes / Clarify orders / etc []  - 0 Routine Transfer to another Facility (non-emergent condition) []  - 0 Routine Hospital Admission (non-emergent condition) []  - 0 New Admissions / Biomedical engineer / Ordering NPWT, Apligraf, etc. []  - 0 Emergency Hospital Admission (emergent condition) X- 1 10 Simple Discharge Coordination [name], [name] ([id number]) []  - 0 Complex (extensive) Discharge Coordination PROCESS - Special Needs []  - Pediatric / Minor Patient Management 0 []  - 0 Isolation Patient Management []  - 0 Hearing / Language / Visual special needs []  - 0 Assessment of Community assistance (transportation, D/C planning, etc.) []  - 0 Additional assistance / Altered mentation []  - 0 Support Surface(s) Assessment (bed,  cushion, seat, etc.) INTERVENTIONS - Wound Cleansing / Measurement X - Simple Wound Cleansing - one wound 1 5 []  - 0 Complex Wound  Cleansing - multiple wounds X- 1 5 Wound Imaging (photographs - any number of wounds) []  - 0 Wound Tracing (instead of photographs) X- 1 5 Simple Wound Measurement - one wound []  - 0 Complex Wound Measurement - multiple wounds INTERVENTIONS - Wound Dressings X - Small Wound Dressing one or multiple wounds 1 10 []  - 0 Medium Wound Dressing one or multiple wounds []  - 0 Large Wound Dressing one or multiple wounds []  - 0 Application of Medications - topical []  - 0 Application of Medications - injection INTERVENTIONS - Miscellaneous []  - External ear exam 0 []  - 0 Specimen Collection (cultures, biopsies, blood, body fluids, etc.) []  - 0 Specimen(s) / Culture(s) sent or taken to Lab for analysis []  - 0 Patient Transfer (multiple staff / Civil Service fast streamer / Similar devices) []  - 0 Simple Staple / Suture removal (25 or less) []  - 0 Complex Staple / Suture removal (26 or more) []  - 0 Hypo / Hyperglycemic Management (close monitor of Blood Glucose) []  - 0 Ankle / Brachial Index (ABI) - do not check if billed separately X- 1 5 Vital Signs [name], [name] ([id number]) Has the patient been seen at the hospital within the last three years: Yes Total Score: 65 Level Of Care: New/Established - Level 2 Electronic Signature(s) Signed: 08/04/2018 5:47:28 PM By: [name], BSN, RN, CWS, [name] Entered By: [name], BSN, RN, CWS, Sally on 08/04/2018 11:23:36 [name] ([id number]) -------------------------------------------------------------------------------- Encounter Discharge Information Details Patient Name: [name] Date of Service: 08/04/2018 11:15 AM Medical Record Number: [id number] Patient Account Number: [id number] Date of Birth/Sex: Sep 08, 1956 (62 y.o. F) Treating RN: [name] Primary Care [name]: [name] Other Clinician: Referring  [name]: [name] Treating Luisangel Wainright/Extender: [name] in Treatment: 1 Encounter Discharge Information Items Discharge Condition: Stable Ambulatory Status: Ambulatory Discharge Destination: Home Transportation: Private Auto Accompanied By: self Schedule Follow-up Appointment: Yes Clinical Summary of Care: Electronic Signature(s) Signed: 08/04/2018 5:47:28 PM By: [name], BSN, RN, CWS, [name] Entered By: [name], BSN, RN, CWS, Sally on 08/04/2018 11:22:59 [name] ([id number]) -------------------------------------------------------------------------------- Patient/Caregiver Education Details Patient Name: [name] Date of Service: 08/04/2018 11:15 AM Medical Record Number: [id number] Patient Account Number: [id number] Date of Birth/Gender: 04/03/1956 (62 y.o. F) Treating RN: [name] Primary Care Physician: [name] Other Clinician: Referring Physician: Rory Anderson Treating Physician/Extender: [name] in Treatment: 1 Education Assessment Education Provided To: Patient Education Topics Provided Wound/Skin Impairment: Handouts: Other: continue wound care as prescribed Methods: Demonstration, Explain/Verbal Responses: State content correctly Electronic Signature(s) Signed: 08/04/2018 5:47:28 PM By: [name], BSN, RN, CWS, [name] Entered By: [name], BSN, RN, CWS, Sally on 08/04/2018 11:22:46 [name] ([id number]) -------------------------------------------------------------------------------- Wound Assessment Details Patient Name: [name] Date of Service: 08/04/2018 11:15 AM Medical Record Number: [id number] Patient Account Number: [id number] Date of Birth/Sex: Aug 09, 1956 (62 y.o. F) Treating RN: [name] Primary Care Labrian Torregrossa: [name] Other Clinician: Referring Luisana Lutzke: [name] Treating Janessa Mickle/Extender: [name], [name] in Treatment: 1 Wound Status Wound Number: 1 Primary Etiology: Open Surgical  Wound Wound Location: Right Lower Leg - Anterior Wound Status: Open Wounding Event: Surgical Injury Date Acquired: 05/23/2018 Anderson Of Treatment: 1 Clustered Wound: No Wound Measurements Length: (cm) 0.6 Width: (cm) 0.2 Depth: (cm) 0.1 Area: (cm) 0.094 Volume: (cm) 0.009 % Reduction in Area: 33.3% % Reduction in Volume: 35.7% Epithelialization: None Tunneling: No Undermining: No Wound Description Full Thickness Without Exposed Support Foul O Classification: [place]  Wound Margin: Flat and Intact Exudate Small Amount: Exudate Type: Serous Exudate Color: amber dor After Cleansing: No /Fibrino Yes Wound Bed Granulation Amount: Medium (34-66%) Exposed Structure Granulation Quality: Pink Fascia Exposed: No Necrotic Amount: Medium (34-66%) Fat Layer (Subcutaneous Tissue) Exposed: Yes Necrotic Quality: Adherent Slough Tendon Exposed: No Muscle Exposed: No Joint Exposed: No Bone Exposed: No Periwound Skin Texture Texture Color No Abnormalities Noted: No No Abnormalities Noted: No Callus: No Atrophie Blanche: No Crepitus: No Cyanosis: No Excoriation: No Ecchymosis: No Induration: No Erythema: Yes Rash: No Erythema Location: Circumferential Scarring: No Hemosiderin Staining: No Mottled: No Moisture Pallor: No No Abnormalities Noted: No Rubor: No Dry / Scaly: No Murthy, Esli A. ([id number]) Maceration: No Temperature / Pain Temperature: No Abnormality Tenderness on Palpation: Yes Wound Preparation Ulcer Cleansing: Rinsed/Irrigated with Saline Topical Anesthetic Applied: Other: lidocaine 4%, Treatment Notes Wound #1 (Right, Anterior Lower Leg) Notes triple antibiotic ointment with coveret Electronic Signature(s) Signed: 08/04/2018 5:47:28 PM By: [name], BSN, RN, CWS, [name] Entered By: [name], BSN, RN, CWS, Sally on 08/04/2018 11:21:39

## 2018-08-17 ENCOUNTER — Encounter: Payer: BC Managed Care – PPO | Admitting: Internal Medicine

## 2018-08-17 DIAGNOSIS — L97211 Non-pressure chronic ulcer of right calf limited to breakdown of skin: Secondary | ICD-10-CM | POA: Diagnosis not present

## 2018-08-19 NOTE — Progress Notes (Signed)
[name] ([id number]) Visit Report for 08/17/2018 Arrival Information Details Patient Name: [name], [name] Date of Service: 08/17/2018 2:00 PM Medical Record Number: [id number] Patient Account Number: [id number] Date of Birth/Sex: 09-Jan-1956 (62 y.o. F) Treating RN: Secundino Ginger Primary Care Jimmey Hengel: Rory Percy Other Clinician: Referring Aariz Maish: Rory Percy Treating Shawna Wearing/Extender: Tito Dine in Treatment: 3 Visit Information History Since Last Visit Added or deleted any medications: No Patient Arrived: Ambulatory Any new allergies or adverse reactions: No Arrival Time: 14:14 Had a fall or experienced change in No Accompanied By: self activities of daily living that may affect Transfer Assistance: None risk of falls: Patient Identification Verified: Yes Signs or symptoms of abuse/neglect since last visito No Secondary Verification Process Completed: Yes Hospitalized since last visit: No Implantable device outside of the clinic excluding No cellular tissue based products placed in the center since last visit: Has Dressing in Place as Prescribed: Yes Pain Present Now: No Electronic Signature(s) Signed: 08/17/2018 4:20:57 PM By: Secundino Ginger Entered By: Secundino Ginger on 08/17/2018 14:17:45 [name] ([id number]) -------------------------------------------------------------------------------- Encounter Discharge Information Details Patient Name: [name] Date of Service: 08/17/2018 2:00 PM Medical Record Number: [id number] Patient Account Number: [id number] Date of Birth/Sex: 1956/05/25 (62 y.o. F) Treating RN: Cornell Barman Primary Care Shantanique Hodo: Rory Percy Other Clinician: Referring Fares Ramthun: Rory Percy Treating Tarhonda Hollenberg/Extender: Tito Dine in Treatment: 3 Encounter Discharge Information Items Post Procedure Vitals Discharge Condition: Stable Temperature (F): 98.4 Ambulatory Status: Ambulatory Pulse (bpm): 88 Discharge  Destination: Home Respiratory Rate (breaths/min): 16 Transportation: Private Auto Blood Pressure (mmHg): 120/66 Accompanied By: self Schedule Follow-up Appointment: Yes Clinical Summary of Care: Patient Declined Electronic Signature(s) Signed: 08/17/2018 5:21:06 PM By: Gretta Cool, BSN, RN, CWS, Kim RN, BSN Entered By: Gretta Cool, BSN, RN, CWS, Kim on 08/17/2018 14:54:37 [name] ([id number]) -------------------------------------------------------------------------------- Lower Extremity Assessment Details Patient Name: [name] Date of Service: 08/17/2018 2:00 PM Medical Record Number: [id number] Patient Account Number: [id number] Date of Birth/Sex: Mar 20, 1956 (62 y.o. F) Treating RN: Secundino Ginger Primary Care Arlesia Kiel: Rory Percy Other Clinician: Referring Gareth Fitzner: Rory Percy Treating Maranatha Grossi/Extender: Tito Dine in Treatment: 3 Edema Assessment Assessed: [Left: No] [Right: No] Edema: [Left: N] [Right: o] Vascular Assessment Pulses: Dorsalis Pedis Palpable: [Left:Yes] Posterior Tibial Extremity colors, hair growth, and conditions: Extremity Color: [Left:Normal] Hair Growth on Extremity: [Left:Yes] Temperature of Extremity: [Left:Warm] Capillary Refill: [Left:< 3 seconds] Toe Nail Assessment Left: Right: Thick: No Discolored: No Deformed: No Improper Length and Hygiene: No Electronic Signature(s) Signed: 08/17/2018 4:20:57 PM By: Secundino Ginger Entered By: Secundino Ginger on 08/17/2018 14:25:21 Stuckey, Rondel Jumbo ([id number]) -------------------------------------------------------------------------------- Multi Wound Chart Details Patient Name: [name] Date of Service: 08/17/2018 2:00 PM Medical Record Number: [id number] Patient Account Number: [id number] Date of Birth/Sex: [birth year] (62 y.o. F) Treating RN: Cornell Barman Primary Care Jodeen Mclin: Rory Percy Other Clinician: Referring Ladasha Schnackenberg: Rory Percy Treating Amiaya Mcneeley/Extender: Tito Dine in  Treatment: 3 Vital Signs Height(in): 66 Pulse(bpm): 88 Weight(lbs): 120 Blood Pressure(mmHg): 120/66 Body Mass Index(BMI): 19 Temperature(F): 98.4 Respiratory Rate 16 (breaths/min): Photos: [N/A:N/A] Wound Location: Right Lower Leg - Anterior N/A N/A Wounding Event: Surgical Injury N/A N/A Primary Etiology: Open Surgical Wound N/A N/A Date Acquired: 05/23/2018 N/A N/A Weeks of Treatment: 3 N/A N/A Wound Status: Open N/A N/A Measurements L x W x D 0.1x0.1x0.1 N/A N/A (cm) Area (cm) : 0.008 N/A N/A Volume (cm) : 0.001 N/A N/A % Reduction in Area: 94.30% N/A N/A % Reduction in Volume: 92.90% N/A  N/A Classification: Full Thickness Without N/A N/A Exposed Support Structures Exudate Amount: None Present N/A N/A Wound Margin: Flat and Intact N/A N/A Granulation Amount: None Present (0%) N/A N/A Necrotic Amount: None Present (0%) N/A N/A Exposed Structures: Fat Layer (Subcutaneous N/A N/A Tissue) Exposed: Yes Fascia: No Tendon: No Muscle: No Joint: No Bone: No Epithelialization: None N/A N/A Debridement: Debridement - Selective/Open N/A N/A Wound 14:37 N/A N/A KALSEY, LULL ([id number]) Pre-procedure Verification/Time Out Taken: Pain Control: Lidocaine 4% Topical Solution N/A N/A Level: Skin/Dermis N/A N/A Debridement Area (sq cm): 0.01 N/A N/A Instrument: Curette N/A N/A Bleeding: None N/A N/A Procedural Pain: 0 N/A N/A Post Procedural Pain: 0 N/A N/A Debridement Treatment Procedure was tolerated well N/A N/A Response: Post Debridement 0.1x0.1x0.1 N/A N/A Measurements L x W x D (cm) Post Debridement Volume: 0.001 N/A N/A (cm) Periwound Skin Texture: Excoriation: No N/A N/A Induration: No Callus: No Crepitus: No Rash: No Scarring: No Periwound Skin Moisture: Maceration: No N/A N/A Dry/Scaly: No Periwound Skin Color: Erythema: Yes N/A N/A Atrophie Blanche: No Cyanosis: No Ecchymosis: No Hemosiderin Staining: No Mottled: No Pallor: No Rubor:  No Erythema Location: Circumferential N/A N/A Temperature: No Abnormality N/A N/A Tenderness on Palpation: Yes N/A N/A Wound Preparation: Ulcer Cleansing: N/A N/A Rinsed/Irrigated with Saline Topical Anesthetic Applied: Other: lidocaine 4% Procedures Performed: Debridement N/A N/A Treatment Notes Wound #1 (Right, Anterior Lower Leg) Notes silver alginate, bandaid Electronic Signature(s) Signed: 08/17/2018 5:31:58 PM By: Linton Ham MD Previous Signature: 08/17/2018 5:21:06 PM Version By: Gretta Cool, BSN, RN, CWS, Kim RN, BSN Entered By: Linton Ham on 08/17/2018 17:24:36 [name] ([id number]) -------------------------------------------------------------------------------- Multi-Disciplinary Care Plan Details Patient Name: [name] Date of Service: 08/17/2018 2:00 PM Medical Record Number: [id number] Patient Account Number: [id number] Date of Birth/Sex: [birth year] (62 y.o. F) Treating RN: Cornell Barman Primary Care Laurelyn Terrero: Rory Percy Other Clinician: Referring Miski Feldpausch: Rory Percy Treating Samule Life/Extender: Tito Dine in Treatment: 3 Active Inactive ` Wound/Skin Impairment Nursing Diagnoses: Impaired tissue integrity Goals: Patient/caregiver will verbalize understanding of skin care regimen Date Initiated: 08/17/2018 Target Resolution Date: 08/25/2018 Goal Status: Active Interventions: Assess patient/caregiver ability to obtain necessary supplies Treatment Activities: Skin care regimen initiated : 08/17/2018 Notes: Electronic Signature(s) Signed: 08/17/2018 5:21:06 PM By: Gretta Cool, BSN, RN, CWS, Kim RN, BSN Entered By: Gretta Cool, BSN, RN, CWS, Kim on 08/17/2018 14:48:00 [name] ([id number]) -------------------------------------------------------------------------------- Pain Assessment Details Patient Name: [name] Date of Service: 08/17/2018 2:00 PM Medical Record Number: [id number] Patient Account Number: [id number] Date of  Birth/Sex: 09-Oct-1955 (62 y.o. F) Treating RN: Secundino Ginger Primary Care Astin Sayre: Rory Percy Other Clinician: Referring Manolo Bosket: Rory Percy Treating Chynna Buerkle/Extender: Tito Dine in Treatment: 3 Active Problems Location of Pain Severity and Description of Pain Patient Has Paino No Site Locations Pain Management and Medication Current Pain Management: Goals for Pain Management pt denies any pain at this time. stated intermittent pain to wound site. Electronic Signature(s) Signed: 08/17/2018 4:20:57 PM By: Secundino Ginger Entered By: Secundino Ginger on 08/17/2018 14:18:21 [name] ([id number]) -------------------------------------------------------------------------------- Patient/Caregiver Education Details Patient Name: [name] Date of Service: 08/17/2018 2:00 PM Medical Record Number: [id number] Patient Account Number: [id number] Date of Birth/Gender: 18-May-1956 (62 y.o. F) Treating RN: Cornell Barman Primary Care Physician: Rory Percy Other Clinician: Referring Physician: Rory Percy Treating Physician/Extender: Tito Dine in Treatment: 3 Education Assessment Education Provided To: Patient Education Topics Provided Wound/Skin Impairment: Handouts: Caring for Your Ulcer Methods: Demonstration, Explain/Verbal Responses: State content correctly Electronic  Signature(s) Signed: 08/17/2018 5:21:06 PM By: Gretta Cool, BSN, RN, CWS, Kim RN, BSN Entered By: Gretta Cool, BSN, RN, CWS, Kim on 08/17/2018 14:54:43 [name] ([id number]) -------------------------------------------------------------------------------- Wound Assessment Details Patient Name: [name] Date of Service: 08/17/2018 2:00 PM Medical Record Number: [id number] Patient Account Number: [id number] Date of Birth/Sex: 1956/02/05 (62 y.o. F) Treating RN: Secundino Ginger Primary Care Lonzell Dorris: Rory Percy Other Clinician: Referring Heaven Wandell: Rory Percy Treating Muadh Creasy/Extender: Tito Dine in Treatment: 3 Wound Status Wound Number: 1 Primary Etiology: Open Surgical Wound Wound Location: Right Lower Leg - Anterior Wound Status: Open Wounding Event: Surgical Injury Date Acquired: 05/23/2018 Weeks Of Treatment: 3 Clustered Wound: No Photos Photo Uploaded By: Secundino Ginger on 08/17/2018 14:30:34 Wound Measurements Length: (cm) 0.1 Width: (cm) 0.1 Depth: (cm) 0.1 Area: (cm) 0.008 Volume: (cm) 0.001 % Reduction in Area: 94.3% % Reduction in Volume: 92.9% Epithelialization: None Tunneling: No Undermining: No Wound Description Full Thickness Without Exposed Support Classification: Structures Wound Margin: Flat and Intact Exudate None Present Amount: Foul Odor After Cleansing: No Slough/Fibrino Yes Wound Bed Granulation Amount: None Present (0%) Exposed Structure Necrotic Amount: None Present (0%) Fascia Exposed: No Fat Layer (Subcutaneous Tissue) Exposed: Yes Tendon Exposed: No Muscle Exposed: No Joint Exposed: No Bone Exposed: No Periwound Skin Texture Texture Color Knoebel, Eyana A. ([id number]) No Abnormalities Noted: No No Abnormalities Noted: No Callus: No Atrophie Blanche: No Crepitus: No Cyanosis: No Excoriation: No Ecchymosis: No Induration: No Erythema: Yes Rash: No Erythema Location: Circumferential Scarring: No Hemosiderin Staining: No Mottled: No Moisture Pallor: No No Abnormalities Noted: No Rubor: No Dry / Scaly: No Maceration: No Temperature / Pain Temperature: No Abnormality Tenderness on Palpation: Yes Wound Preparation Ulcer Cleansing: Rinsed/Irrigated with Saline Topical Anesthetic Applied: Other: lidocaine 4%, Treatment Notes Wound #1 (Right, Anterior Lower Leg) Notes silver alginate, bandaid Electronic Signature(s) Signed: 08/17/2018 4:20:57 PM By: Secundino Ginger Entered By: Secundino Ginger on 08/17/2018 14:23:55 Haff, Rondel Jumbo  ([id number]) -------------------------------------------------------------------------------- Vitals Details Patient Name: [name] Date of Service: 08/17/2018 2:00 PM Medical Record Number: [id number] Patient Account Number: [id number] Date of Birth/Sex: 08/11/1956 (62 y.o. F) Treating RN: Secundino Ginger Primary Care Shoua Ressler: Rory Percy Other Clinician: Referring Delfin Squillace: Rory Percy Treating Jahmiyah Dullea/Extender: Tito Dine in Treatment: 3 Vital Signs Time Taken: 14:19 Temperature (F): 98.4 Height (in): 66 Pulse (bpm): 88 Weight (lbs): 120 Respiratory Rate (breaths/min): 16 Body Mass Index (BMI): 19.4 Blood Pressure (mmHg): 120/66 Reference Range: 80 - 120 mg / dl Electronic Signature(s) Signed: 08/17/2018 4:20:57 PM By: Secundino Ginger Entered By: Secundino Ginger on 08/17/2018 14:20:05

## 2018-08-20 NOTE — Progress Notes (Signed)
VIVEKA, WILMETH ([id number]) Visit Report for 08/17/2018 Debridement Details Patient Name: [name], [name] Date of Service: 08/17/2018 2:00 PM Medical Record Number: [id number] Patient Account Number: [id number] Date of Birth/Sex: December 02, 1955 (62 y.o. F) Treating RN: Cornell Barman Primary Care Provider: Rory Percy Other Clinician: Referring Provider: Rory Percy Treating Provider/Extender: Tito Dine in Treatment: 3 Debridement Performed for Wound #1 Right,Anterior Lower Leg Assessment: Performed By: Physician Ricard Dillon, MD Debridement Type: Debridement Level of Consciousness (Pre- Awake and Alert procedure): Pre-procedure Verification/Time Yes - 14:37 Out Taken: Start Time: 14:37 Pain Control: Lidocaine 4% Topical Solution Total Area Debrided (L x W): 0.1 (cm) x 0.1 (cm) = 0.01 (cm) Tissue and other material Skin: Dermis debrided: Level: Skin/Dermis Debridement Description: Selective/Open Wound Instrument: Curette Bleeding: None End Time: 14:40 Procedural Pain: 0 Post Procedural Pain: 0 Response to Treatment: Procedure was tolerated well Level of Consciousness Awake and Alert (Post-procedure): Post Debridement Measurements of Total Wound Length: (cm) 0.1 Width: (cm) 0.1 Depth: (cm) 0.1 Volume: (cm) 0.001 Character of Wound/Ulcer Post Debridement: Improved Post Procedure Diagnosis Same as Pre-procedure Electronic Signature(s) Signed: 08/17/2018 5:31:58 PM By: Linton Ham MD Signed: 08/18/2018 12:03:50 PM By: Gretta Cool, BSN, RN, CWS, Kim RN, BSN Previous Signature: 08/17/2018 5:21:06 PM Version By: Gretta Cool, BSN, RN, CWS, Kim RN, BSN Entered By: Linton Ham on 08/17/2018 17:24:58 [name] ([id number]) -------------------------------------------------------------------------------- HPI Details Patient Name: [name] Date of Service: 08/17/2018 2:00 PM Medical Record Number: [id number] Patient Account Number: [id number] Date of  Birth/Sex: January 13, 1956 (62 y.o. F) Treating RN: Cornell Barman Primary Care Provider: Rory Percy Other Clinician: Referring Provider: Rory Percy Treating Provider/Extender: Tito Dine in Treatment: 3 History of Present Illness HPI Description: ADMISSION 07/27/18 This is a reasonably healthy 62 year old woman who lives in [place]. She cares for a disabled son with cerebral palsy and has parents in [place] who she is also the primary caregiver for. She gives a history of having a hyperkeratotic raised area for maybe as long as 1 or 2 years on the right anterior tibia. She was seen by her primary doctor by now we do not think this was anything serious. She also really came to the attention of Dr. Nevada Crane who is dermatology in [place] and apparently underwent liquid nitrogen therapy on 2 different occasions but the lesion Coming Back. At [facility] Her Primary Doctor Did a Shave Biopsy That Showed Atypia and Ultimately Another Biopsy That Showed Squamous Cell Carcinoma. She was referred to the wound care center in [place] which apparently is being managed by Dr. Anthony Sar Who is a Education officer, environmental. He excised this area on 05/31/18 with pathology showing squamous cell carcinoma with extensive ulceration. The tumor appeared to be completely excised. The patient states that she had problems almost immediately after with swelling of her foot and was in the ER. One week later she had have the stitches removed and then 2 weeks later all the stitches were removed. She showed me a picture from this timeframe and perhaps there was a tiny open area in the mid part of the surgical wound. Apparently this is never really closed and the patient is frustrated she has not gotten specific instructions on how to deal with this. More recently she has a small raised area above the original surgical area that is tender looks like a small pimple although there is no pustular head to it. The  patient does not have a history of any other skin lesions or skin  cancers. 08/17/18;The patient returns with the same small nodule in the superior part of the incision and a small linear open area in the original incision area. She is very anxious. We have been using silver alginate Electronic Signature(s) Signed: 08/17/2018 5:31:58 PM By: Linton Ham MD Entered By: Linton Ham on 08/17/2018 17:26:36 [name] ([id number]) -------------------------------------------------------------------------------- Physical Exam Details Patient Name: [name] Date of Service: 08/17/2018 2:00 PM Medical Record Number: [id number] Patient Account Number: [id number] Date of Birth/Sex: 1955-11-24 (62 y.o. F) Treating RN: Cornell Barman Primary Care Provider: Rory Percy Other Clinician: Referring Provider: Rory Percy Treating Provider/Extender: Tito Dine in Treatment: 3 Constitutional Sitting or standing Blood Pressure is within target range for patient.. Pulse regular and within target range for patient.Marland Kitchen Respirations regular, non-labored and within target range.. Temperature is normal and within the target range for the patient.Marland Kitchen appears in no distress. Notes Wound exam; right anterior tibia. Surgical wound with a very small open area in the middle. I think this is almost closed. She has the same area that I thought was a cyst last time. It is actually a small nodule. I unroofed this there is no fluid there is no purulence Electronic Signature(s) Signed: 08/17/2018 5:31:58 PM By: Linton Ham MD Entered By: Linton Ham on 08/17/2018 17:27:42 [name] ([id number]) -------------------------------------------------------------------------------- Physician Orders Details Patient Name: [name] Date of Service: 08/17/2018 2:00 PM Medical Record Number: [id number] Patient Account Number: [id number] Date of Birth/Sex: April 06, 1956 (62 y.o. F) Treating RN: Cornell Barman Primary Care Provider: Rory Percy Other Clinician: Referring Provider: Rory Percy Treating Provider/Extender: Tito Dine in Treatment: 3 Verbal / Phone Orders: No Diagnosis Coding Anesthetic (add to Medication List) Wound #1 Right,Anterior Lower Leg o Topical Lidocaine 4% cream applied to wound bed prior to debridement (In Clinic Only). Primary Wound Dressing o Silver Alginate Secondary Dressing Wound #1 Right,Anterior Lower Leg o Boardered Foam Dressing Dressing Change Frequency Wound #1 Right,Anterior Lower Leg o Change dressing every other day. Follow-up Appointments Wound #1 Right,Anterior Lower Leg o Return Appointment in 2 weeks. o Nurse Visit as needed Electronic Signature(s) Signed: 08/17/2018 5:21:06 PM By: Gretta Cool, BSN, RN, CWS, Kim RN, BSN Signed: 08/17/2018 5:31:58 PM By: Linton Ham MD Entered By: Gretta Cool, BSN, RN, CWS, Kim on 08/17/2018 14:51:54 [name] ([id number]) -------------------------------------------------------------------------------- Problem List Details Patient Name: [name] Date of Service: 08/17/2018 2:00 PM Medical Record Number: [id number] Patient Account Number: [id number] Date of Birth/Sex: 1956/05/13 (62 y.o. F) Treating RN: Cornell Barman Primary Care Provider: Rory Percy Other Clinician: Referring Provider: Rory Percy Treating Provider/Extender: Tito Dine in Treatment: 3 Active Problems ICD-10 Evaluated Encounter Code Description Active Date Today Diagnosis T81.31XD Disruption of external operation (surgical) wound, not 07/27/2018 No Yes elsewhere classified, subsequent encounter L97.211 Non-pressure chronic ulcer of right calf limited to breakdown 07/27/2018 No Yes of skin C44.722 Squamous cell carcinoma of skin of right lower limb, including 07/27/2018 No Yes hip Inactive Problems Resolved Problems Electronic Signature(s) Signed: 08/17/2018 5:31:58 PM By: Linton Ham MD Entered By: Linton Ham on 08/17/2018 17:24:23 [name] ([id number]) -------------------------------------------------------------------------------- Progress Note Details Patient Name: [name] Date of Service: 08/17/2018 2:00 PM Medical Record Number: [id number] Patient Account Number: [id number] Date of Birth/Sex: 1956/02/09 (62 y.o. F) Treating RN: Cornell Barman Primary Care Provider: Rory Percy Other Clinician: Referring Provider: Rory Percy Treating Provider/Extender: Tito Dine in Treatment: 3 Subjective History of Present Illness (HPI) ADMISSION 07/27/18 This is  a reasonably healthy 62 year old woman who lives in [place]. She cares for a disabled son with cerebral palsy and has parents in [place] who she is also the primary caregiver for. She gives a history of having a hyperkeratotic raised area for maybe as long as 1 or 2 years on the right anterior tibia. She was seen by her primary doctor by now we do not think this was anything serious. She also really came to the attention of Dr. [place] Crane who is dermatology in [place] and apparently underwent liquid nitrogen therapy on 2 different occasions but the lesion Coming Back. At [facility] Her Primary Doctor Did a Shave Biopsy That Showed Atypia and Ultimately Another Biopsy That Showed Squamous Cell Carcinoma. She was referred to the wound care center in [place] which apparently is being managed by Dr. Anthony Sar Who is a Education officer, environmental. He excised this area on 05/31/18 with pathology showing squamous cell carcinoma with extensive ulceration. The tumor appeared to be completely excised. The patient states that she had problems almost immediately after with swelling of her foot and was in the ER. One week later she had have the stitches removed and then 2 weeks later all the stitches were removed. She showed me a picture from this timeframe and perhaps there was a tiny open area in  the mid part of the surgical wound. Apparently this is never really closed and the patient is frustrated she has not gotten specific instructions on how to deal with this. More recently she has a small raised area above the original surgical area that is tender looks like a small pimple although there is no pustular head to it. The patient does not have a history of any other skin lesions or skin cancers. 08/17/18;The patient returns with the same small nodule in the superior part of the incision and a small linear open area in the original incision area. She is very anxious. We have been using silver alginate Objective Constitutional Sitting or standing Blood Pressure is within target range for patient.. Pulse regular and within target range for patient.Marland Kitchen Respirations regular, non-labored and within target range.. Temperature is normal and within the target range for the patient.Marland Kitchen appears in no distress. Vitals Time Taken: 2:19 PM, Height: 66 in, Weight: 120 lbs, BMI: 19.4, Temperature: 98.4 F, Pulse: 88 bpm, Respiratory Rate: 16 breaths/min, Blood Pressure: 120/66 mmHg. General Notes: Wound exam; right anterior tibia. Surgical wound with a very small open area in the middle. I think this is almost closed. She has the same area that I thought was a cyst last time. It is actually a small nodule. I unroofed this there is no fluid there is no purulence [name], Sally A. ([id number]) Integumentary (Hair, Skin) Wound #1 status is Open. Original cause of wound was Surgical Injury. The wound is located on the Right,Anterior Lower Leg. The wound measures 0.1cm length x 0.1cm width x 0.1cm depth; 0.008cm^2 area and 0.001cm^3 volume. There is Fat Layer (Subcutaneous Tissue) Exposed exposed. There is no tunneling or undermining noted. There is a none present amount of drainage noted. The wound margin is flat and intact. There is no granulation within the wound bed. There is no necrotic tissue within the  wound bed. The periwound skin appearance exhibited: Erythema. The periwound skin appearance did not exhibit: Callus, Crepitus, Excoriation, Induration, Rash, Scarring, Dry/Scaly, Maceration, Atrophie Blanche, Cyanosis, Ecchymosis, Hemosiderin Staining, Mottled, Pallor, Rubor. The surrounding wound skin color is noted with erythema which is circumferential. Periwound temperature was  noted as No Abnormality. The periwound has tenderness on palpation. Assessment Active Problems ICD-10 Disruption of external operation (surgical) wound, not elsewhere classified, subsequent encounter Non-pressure chronic ulcer of right calf limited to breakdown of skin Squamous cell carcinoma of skin of right lower limb, including hip Procedures Wound #1 Pre-procedure diagnosis of Wound #1 is an Open Surgical Wound located on the Right,Anterior Lower Leg . There was a Selective/Open Wound Skin/Dermis Debridement with a total area of 0.01 sq cm performed by Ricard Dillon, MD. With the following instrument(s): Curette Material removed includes Skin: Dermis after achieving pain control using Lidocaine 4% Topical Solution. A time out was conducted at 14:37, prior to the start of the procedure. There was no bleeding. The procedure was tolerated well with a pain level of 0 throughout and a pain level of 0 following the procedure. Post Debridement Measurements: 0.1cm length x 0.1cm width x 0.1cm depth; 0.001cm^3 volume. Character of Wound/Ulcer Post Debridement is improved. Post procedure Diagnosis Wound #1: Same as Pre-Procedure Plan Anesthetic (add to Medication List): Wound #1 Right,Anterior Lower Leg: Topical Lidocaine 4% cream applied to wound bed prior to debridement (In Clinic Only). Primary Wound Dressing: Silver Alginate Secondary Dressing: Wound #1 Right,Anterior Lower Leg: Boardered Foam Dressing Dressing Change Frequency: Wound #1 Right,Anterior Lower Leg: Change dressing every other day. Follow-up  Appointments: [name], [name] ([id number]) Wound #1 Right,Anterior Lower Leg: Return Appointment in 2 weeks. Nurse Visit as needed #1 the patient showed me a picture of her wound that ultimately proved to be cancerous. This was after a shave biopsy. Actually look like a fairly benign ulcer. #2 the small nodular area superiorly twice I have opened this and it is recurrent. There is no fluid here but I am doubtful I'm going to open this again. I wonder about the completeness of the excision and I wonder if simply leaving this alone will declare whether there is a problem with recurrence in the area. #3 with regards to the surgical wound there is a small open area that is clearly draining but I think this will be closed by the next time she is here Electronic Signature(s) Signed: 08/17/2018 5:31:58 PM By: Linton Ham MD Entered By: Linton Ham on 08/17/2018 17:29:38 [name] ([id number]) -------------------------------------------------------------------------------- SuperBill Details Patient Name: [name] Date of Service: 08/17/2018 Medical Record Number: [id number] Patient Account Number: [id number] Date of Birth/Sex: October 26, 1955 (62 y.o. F) Treating RN: Cornell Barman Primary Care Provider: Rory Percy Other Clinician: Referring Provider: Rory Percy Treating Provider/Extender: Tito Dine in Treatment: 3 Diagnosis Coding ICD-10 Codes Code Description T81.31XD Disruption of external operation (surgical) wound, not elsewhere classified, subsequent encounter L97.211 Non-pressure chronic ulcer of right calf limited to breakdown of skin C44.722 Squamous cell carcinoma of skin of right lower limb, including hip Facility Procedures CPT4: Description Modifier Quantity Code [id number] - DEBRIDE WOUND 1ST 20 SQ CM OR < 1 ICD-10 Diagnosis Description T81.31XD Disruption of external operation (surgical) wound, not elsewhere classified, subsequent encounter L97.211  Non-pressure  chronic ulcer of right calf limited to breakdown of skin Physician Procedures CPT4: Description Modifier Quantity Code [id number] - WC PHYS DEBR WO ANESTH 20 SQ CM 1 ICD-10 Diagnosis Description T81.31XD Disruption of external operation (surgical) wound, not elsewhere classified, subsequent encounter L97.211 Non-pressure  chronic ulcer of right calf limited to breakdown of skin Electronic Signature(s) Signed: 08/17/2018 5:31:58 PM By: Linton Ham MD Entered By: Linton Ham on 08/17/2018 17:29:57

## 2018-08-31 ENCOUNTER — Encounter: Payer: BC Managed Care – PPO | Attending: Internal Medicine | Admitting: Internal Medicine

## 2018-08-31 DIAGNOSIS — L97211 Non-pressure chronic ulcer of right calf limited to breakdown of skin: Secondary | ICD-10-CM | POA: Insufficient documentation

## 2018-08-31 DIAGNOSIS — T8131XD Disruption of external operation (surgical) wound, not elsewhere classified, subsequent encounter: Secondary | ICD-10-CM | POA: Insufficient documentation

## 2018-08-31 DIAGNOSIS — C44722 Squamous cell carcinoma of skin of right lower limb, including hip: Secondary | ICD-10-CM | POA: Diagnosis not present

## 2018-08-31 DIAGNOSIS — X58XXXD Exposure to other specified factors, subsequent encounter: Secondary | ICD-10-CM | POA: Insufficient documentation

## 2018-09-03 NOTE — Progress Notes (Signed)
VERONCIA, JEZEK ([id number]) Visit Report for 08/31/2018 Arrival Information Details Patient Name: [name], [name] Date of Service: 08/31/2018 11:00 AM Medical Record Number: [id number] Patient Account Number: [id number] Date of Birth/Sex: 1956-04-16 (62 y.o. F) Treating RN: Secundino Ginger Primary Care Nou Chard: Rory Percy Other Clinician: Referring Shaunn Tackitt: Rory Percy Treating Jersie Beel/Extender: Tito Dine in Treatment: 5 Visit Information History Since Last Visit Added or deleted any medications: No Patient Arrived: Ambulatory Any new allergies or adverse reactions: No Arrival Time: 10:50 Had a fall or experienced change in No Accompanied By: self activities of daily living that may affect Transfer Assistance: None risk of falls: Patient Identification Verified: Yes Signs or symptoms of abuse/neglect since last visito No Secondary Verification Process Completed: Yes Hospitalized since last visit: No Implantable device outside of the clinic excluding No cellular tissue based products placed in the center since last visit: Has Dressing in Place as Prescribed: Yes Pain Present Now: No Electronic Signature(s) Signed: 08/31/2018 4:48:38 PM By: Secundino Ginger Entered By: Secundino Ginger on 08/31/2018 10:51:03 [name] ([id number]) -------------------------------------------------------------------------------- Encounter Discharge Information Details Patient Name: [name] Date of Service: 08/31/2018 11:00 AM Medical Record Number: [id number] Patient Account Number: [id number] Date of Birth/Sex: [birth year] (62 y.o. F) Treating RN: Cornell Barman Primary Care Kein Carlberg: Rory Percy Other Clinician: Referring Hiren Peplinski: Rory Percy Treating Zaidan Keeble/Extender: Tito Dine in Treatment: 5 Encounter Discharge Information Items Post Procedure Vitals Discharge Condition: Stable Temperature (F): 97.5 Ambulatory Status: Ambulatory Pulse (bpm): 99 Discharge Destination:  Home Respiratory Rate (breaths/min): 16 Transportation: Private Auto Blood Pressure (mmHg): 138/80 Accompanied By: self Schedule Follow-up Appointment: No Clinical Summary of Care: Electronic Signature(s) Signed: 08/31/2018 6:02:29 PM By: Gretta Cool, BSN, RN, CWS, Kim RN, BSN Entered By: Gretta Cool, BSN, RN, CWS, Kim on 08/31/2018 11:13:58 [name] ([id number]) -------------------------------------------------------------------------------- Lower Extremity Assessment Details Patient Name: [name] Date of Service: 08/31/2018 11:00 AM Medical Record Number: [id number] Patient Account Number: [id number] Date of Birth/Sex: 10-12-1955 (62 y.o. F) Treating RN: Secundino Ginger Primary Care Calissa Swenor: Rory Percy Other Clinician: Referring Benancio Osmundson: Rory Percy Treating Doaa Kendzierski/Extender: Tito Dine in Treatment: 5 Edema Assessment Assessed: [Left: No] [Right: No] Edema: [Left: N] [Right: o] Vascular Assessment Claudication: Claudication Assessment [Right:None] Pulses: Dorsalis Pedis Palpable: [Right:Yes] Posterior Tibial Extremity colors, hair growth, and conditions: Extremity Color: [Right:Normal] Hair Growth on Extremity: [Right:No] Capillary Refill: [Right:< 3 seconds] Toe Nail Assessment Left: Right: Thick: No Discolored: No Deformed: No Improper Length and Hygiene: No Electronic Signature(s) Signed: 08/31/2018 4:48:38 PM By: Secundino Ginger Entered By: Secundino Ginger on 08/31/2018 10:55:44 Surace, Rondel Jumbo ([id number]) -------------------------------------------------------------------------------- Multi Wound Chart Details Patient Name: [name] Date of Service: 08/31/2018 11:00 AM Medical Record Number: [id number] Patient Account Number: [id number] Date of Birth/Sex: 1956-03-18 (62 y.o. F) Treating RN: Cornell Barman Primary Care Navjot Loera: Rory Percy Other Clinician: Referring Dreyton Roessner: Rory Percy Treating Whitt Auletta/Extender: Tito Dine in Treatment:  5 Vital Signs Height(in): 66 Pulse(bpm): 99 Weight(lbs): 120 Blood Pressure(mmHg): 134/80 Body Mass Index(BMI): 19 Temperature(F): 97.5 Respiratory Rate 16 (breaths/min): Photos: [1:No Photos] [N/A:N/A] Wound Location: [1:Right Lower Leg - Anterior] [N/A:N/A] Wounding Event: [1:Surgical Injury] [N/A:N/A] Primary Etiology: [1:Open Surgical Wound] [N/A:N/A] Date Acquired: [1:05/23/2018] [N/A:N/A] Weeks of Treatment: [1:5] [N/A:N/A] Wound Status: [1:Open] [N/A:N/A] Measurements L x W x D [1:0.1x0.1x0.1] [N/A:N/A] (cm) Area (cm) : [1:0.008] [N/A:N/A] Volume (cm) : [1:0.001] [N/A:N/A] % Reduction in Area: [1:94.30%] [N/A:N/A] % Reduction in Volume: [1:92.90%] [N/A:N/A] Classification: [1:Full Thickness Without Exposed Support Structures] [N/A:N/A] Exudate Amount: [  1:None Present] [N/A:N/A] Wound Margin: [1:Flat and Intact] [N/A:N/A] Granulation Amount: [1:None Present (0%)] [N/A:N/A] Necrotic Amount: [1:None Present (0%)] [N/A:N/A] Exposed Structures: [1:Fat Layer (Subcutaneous Tissue) Exposed: Yes Fascia: No Tendon: No Muscle: No Joint: No Bone: No] [N/A:N/A] Epithelialization: [1:None] [N/A:N/A] Debridement: [1:Debridement - Selective/Open Wound] [N/A:N/A] Pre-procedure [1:11:07] [N/A:N/A] Verification/Time Out Taken: Pain Control: [1:Lidocaine] [N/A:N/A] Tissue Debrided: [1:Necrotic/Eschar] [N/A:N/A] Level: [1:Non-Viable Tissue] [N/A:N/A] Debridement Area (sq cm): [1:0.02] [N/A:N/A] Instrument: [1:Curette] [N/A:N/A] Bleeding: [1:None] [N/A:N/A] Debridement Treatment Procedure was tolerated well N/A N/A Response: Post Debridement 0.1x0.2x0.2 N/A N/A Measurements L x W x D (cm) Post Debridement Volume: 0.003 N/A N/A (cm) Periwound Skin Texture: Excoriation: No N/A N/A Induration: No Callus: No Crepitus: No Rash: No Scarring: No Periwound Skin Moisture: Maceration: No N/A N/A Dry/Scaly: No Periwound Skin Color: Erythema: Yes N/A N/A Atrophie Blanche:  No Cyanosis: No Ecchymosis: No Hemosiderin Staining: No Mottled: No Pallor: No Rubor: No Erythema Location: Circumferential N/A N/A Temperature: No Abnormality N/A N/A Tenderness on Palpation: Yes N/A N/A Wound Preparation: Ulcer Cleansing: N/A N/A Rinsed/Irrigated with Saline Topical Anesthetic Applied: Other: lidocaine 4% Procedures Performed: Debridement N/A N/A Treatment Notes Wound #1 (Right, Anterior Lower Leg) 2. Anesthetic Topical 2.5%/2.5% Lidocaine and Prilocaine Cream 4. Dressing Applied: Prisma Ag Notes bandaid Electronic Signature(s) Signed: 08/31/2018 4:50:38 PM By: Linton Ham MD Entered By: Linton Ham on 08/31/2018 11:15:54 [name] ([id number]) -------------------------------------------------------------------------------- Multi-Disciplinary Care Plan Details Patient Name: [name] Date of Service: 08/31/2018 11:00 AM Medical Record Number: [id number] Patient Account Number: [id number] Date of Birth/Sex: Nov 16, 1955 (62 y.o. F) Treating RN: Cornell Barman Primary Care Velvia Mehrer: Rory Percy Other Clinician: Referring Nhia Heaphy: Rory Percy Treating Jahnya Trindade/Extender: Tito Dine in Treatment: 5 Active Inactive Wound/Skin Impairment Nursing Diagnoses: Impaired tissue integrity Goals: Patient/caregiver will verbalize understanding of skin care regimen Date Initiated: 08/17/2018 Target Resolution Date: 08/25/2018 Goal Status: Active Interventions: Assess patient/caregiver ability to obtain necessary supplies Treatment Activities: Skin care regimen initiated : 08/17/2018 Notes: Electronic Signature(s) Signed: 08/31/2018 6:02:29 PM By: Gretta Cool, BSN, RN, CWS, Kim RN, BSN Entered By: Gretta Cool, BSN, RN, CWS, Kim on 08/31/2018 11:05:23 [name] ([id number]) -------------------------------------------------------------------------------- Pain Assessment Details Patient Name: [name] Date of Service: 08/31/2018 11:00  AM Medical Record Number: [id number] Patient Account Number: [id number] Date of Birth/Sex: 1956-09-19 (62 y.o. F) Treating RN: Secundino Ginger Primary Care Karriem Muench: Rory Percy Other Clinician: Referring Blessed Girdner: Rory Percy Treating Apurva Reily/Extender: Tito Dine in Treatment: 5 Active Problems Location of Pain Severity and Description of Pain Patient Has Paino No Site Locations Pain Management and Medication Current Pain Management: Goals for Pain Management pt denies any pain at this time. Electronic Signature(s) Signed: 08/31/2018 4:48:38 PM By: Secundino Ginger Entered By: Secundino Ginger on 08/31/2018 10:51:28 [name] ([id number]) -------------------------------------------------------------------------------- Patient/Caregiver Education Details Patient Name: [name] Date of Service: 08/31/2018 11:00 AM Medical Record Number: [id number] Patient Account Number: [id number] Date of Birth/Gender: [birth year] (62 y.o. F) Treating RN: Cornell Barman Primary Care Physician: Rory Percy Other Clinician: Referring Physician: Rory Percy Treating Physician/Extender: Tito Dine in Treatment: 5 Education Assessment Education Provided To: Patient Education Topics Provided Wound/Skin Impairment: Handouts: Caring for Your Ulcer Methods: Demonstration, Explain/Verbal Responses: State content correctly Electronic Signature(s) Signed: 08/31/2018 6:02:29 PM By: Gretta Cool, BSN, RN, CWS, Kim RN, BSN Entered By: Gretta Cool, BSN, RN, CWS, Kim on 08/31/2018 11:10:12 [name] ([id number]) -------------------------------------------------------------------------------- Wound Assessment Details Patient Name: [name] Date of Service: 08/31/2018 11:00 AM Medical Record Number: [id number] Patient Account  Number: [id number] Date of Birth/Sex: 1956/05/25 (62 y.o. F) Treating RN: Secundino Ginger Primary Care Jaikob Borgwardt: Rory Percy Other Clinician: Referring Glenette Bookwalter: Rory Percy Treating Keegan Ducey/Extender: Tito Dine in Treatment: 5 Wound Status Wound Number: 1 Primary Etiology: Open Surgical Wound Wound Location: Right Lower Leg - Anterior Wound Status: Open Wounding Event: Surgical Injury Date Acquired: 05/23/2018 Weeks Of Treatment: 5 Clustered Wound: No Wound Measurements Length: (cm) 0.1 Width: (cm) 0.1 Depth: (cm) 0.1 Area: (cm) 0.008 Volume: (cm) 0.001 % Reduction in Area: 94.3% % Reduction in Volume: 92.9% Epithelialization: None Tunneling: No Wound Description Full Thickness Without Exposed Support Foul Classification: Structures Slou Wound Margin: Flat and Intact Exudate None Present Amount: Odor After Cleansing: No gh/Fibrino Yes Wound Bed Granulation Amount: None Present (0%) Exposed Structure Necrotic Amount: None Present (0%) Fascia Exposed: No Fat Layer (Subcutaneous Tissue) Exposed: Yes Tendon Exposed: No Muscle Exposed: No Joint Exposed: No Bone Exposed: No Periwound Skin Texture Texture Color No Abnormalities Noted: No No Abnormalities Noted: No Callus: No Atrophie Blanche: No Crepitus: No Cyanosis: No Excoriation: No Ecchymosis: No Induration: No Erythema: Yes Rash: No Erythema Location: Circumferential Scarring: No Hemosiderin Staining: No Mottled: No Moisture Pallor: No No Abnormalities Noted: No Rubor: No Dry / Scaly: No Maceration: No Temperature / Pain Temperature: No Abnormality Alejos, Abisai A. ([id number]) Tenderness on Palpation: Yes Wound Preparation Ulcer Cleansing: Rinsed/Irrigated with Saline Topical Anesthetic Applied: Other: lidocaine 4%, Treatment Notes Wound #1 (Right, Anterior Lower Leg) 2. Anesthetic Topical 2.5%/2.5% Lidocaine and Prilocaine Cream 4. Dressing Applied: Prisma Ag Notes bandaid Electronic Signature(s) Signed: 08/31/2018 4:48:38 PM By: Secundino Ginger Entered By: Secundino Ginger on 08/31/2018 10:55:13 [name]  ([id number]) -------------------------------------------------------------------------------- Vitals Details Patient Name: [name] Date of Service: 08/31/2018 11:00 AM Medical Record Number: [id number] Patient Account Number: [id number] Date of Birth/Sex: Sep 04, 1956 (62 y.o. F) Treating RN: Secundino Ginger Primary Care Lyndie Vanderloop: Rory Percy Other Clinician: Referring Patryce Depriest: Rory Percy Treating Trevaris Pennella/Extender: Tito Dine in Treatment: 5 Vital Signs Time Taken: 10:51 Temperature (F): 97.5 Height (in): 66 Pulse (bpm): 99 Weight (lbs): 120 Respiratory Rate (breaths/min): 16 Body Mass Index (BMI): 19.4 Blood Pressure (mmHg): 134/80 Reference Range: 80 - 120 mg / dl Electronic Signature(s) Signed: 08/31/2018 4:48:38 PM By: Secundino Ginger Entered By: Secundino Ginger on 08/31/2018 10:52:03

## 2018-09-03 NOTE — Progress Notes (Signed)
[name] ([id number]) Visit Report for 08/31/2018 Debridement Details Patient Name: [name], [name] Date of Service: 08/31/2018 11:00 AM Medical Record Number: [id number] Patient Account Number: [id number] Date of Birth/Sex: November 02, 1955 (62 y.o. F) Treating Anderson: [name] Primary Care Provider: Rory Anderson Other Clinician: Referring Provider: Rory Anderson Treating Provider/Extender: [name] in Treatment: 5 Debridement Performed for Wound #1 Right,Anterior Lower Leg Assessment: Performed By: Physician Sally Dillon, MD Debridement Type: Debridement Level of Consciousness (Pre- Awake and Alert procedure): Pre-procedure Verification/Time Yes - 11:07 Out Taken: Start Time: 11:08 Pain Control: Lidocaine Total Area Debrided (L x W): 0.1 (cm) x 0.2 (cm) = 0.02 (cm) Tissue and other material Non-Viable, Eschar debrided: Level: Non-Viable Tissue Debridement Description: Selective/Open Wound Instrument: Curette Bleeding: None End Time: 11:09 Response to Treatment: Procedure was tolerated well Level of Consciousness Awake and Alert (Post-procedure): Post Debridement Measurements of Total Wound Length: (cm) 0.1 Width: (cm) 0.2 Depth: (cm) 0.2 Volume: (cm) 0.003 Character of Wound/Ulcer Post Debridement: Requires Further Debridement Post Procedure Diagnosis Same as Pre-procedure Electronic Signature(s) Signed: 08/31/2018 4:50:38 PM By: Sally Ham MD Signed: 08/31/2018 6:02:29 PM By: [name], BSN, Anderson, CWS, [name], BSN Entered By: [name] on 08/31/2018 11:16:02 [name] ([id number]) -------------------------------------------------------------------------------- HPI Details Patient Name: [name] Date of Service: 08/31/2018 11:00 AM Medical Record Number: [id number] Patient Account Number: [id number] Date of Birth/Sex: 1955/10/06 (62 y.o. F) Treating Anderson: [name] Primary Care Provider: Rory Anderson Other Clinician: Referring  Provider: Rory Anderson Treating Provider/Extender: [name] in Treatment: 5 History of Present Illness HPI Description: ADMISSION 07/27/18 This is a reasonably healthy 62 year old woman who lives in [place]. She cares for a disabled son with cerebral palsy and has parents in [place] who she is also the primary caregiver for. She gives a history of having a hyperkeratotic raised area for maybe as long as 1 or 2 years on the right anterior tibia. She was seen by her primary doctor by now we do not think this was anything serious. She also really came to the attention of [name] who is dermatology in [place] and apparently underwent liquid nitrogen therapy on 2 different occasions but the lesion Coming Back. At [facility] Her Primary Doctor Did a Shave Biopsy That Showed Atypia and Ultimately Another Biopsy That Showed Squamous Cell Carcinoma. She was referred to the wound care center in [place] which apparently is being managed by [name] Who is a Education officer, environmental. He excised this area on 05/31/18 with pathology showing squamous cell carcinoma with extensive ulceration. The tumor appeared to be completely excised. The patient states that she had problems almost immediately after with swelling of her foot and was in the ER. One week later she had have the stitches removed and then 2 weeks later all the stitches were removed. She showed me a picture from this timeframe and perhaps there was a tiny open area in the mid part of the surgical wound. Apparently this is never really closed and the patient is frustrated she has not gotten specific instructions on how to deal with this. More recently she has a small raised area above the original surgical area that is tender looks like a small pimple although there is no pustular head to it. The patient does not have a history of any other skin lesions or skin cancers. 08/17/18;The patient returns with the same small  nodule in the superior part of the incision and a small linear open  area in the original incision area. She is very anxious. We have been using silver alginate 08/31/18; the patient still has a small open area on the tip of the surgical incision the rest of this is closed we've been using silver alginate but I changed her to soak her collagen today. She complains about swelling in her foot and some discomfort in the area. Electronic Signature(s) Signed: 08/31/2018 4:50:38 PM By: Sally Ham MD Entered By: [name] on 08/31/2018 11:16:49 [name] ([id number]) -------------------------------------------------------------------------------- Physical Exam Details Patient Name: [name] Date of Service: 08/31/2018 11:00 AM Medical Record Number: [id number] Patient Account Number: [id number] Date of Birth/Sex: 1956-02-04 (62 y.o. F) Treating Anderson: [name] Primary Care Provider: Rory Anderson Other Clinician: Referring Provider: Rory Anderson Treating Provider/Extender: [name] in Treatment: 5 Constitutional Sitting or standing Blood Pressure is within target range for patient.. Pulse regular and within target range for patient.Marland Kitchen Respirations regular, non-labored and within target range.. Temperature is normal and within the target range for the patient.Marland Kitchen appears in no distress. Eyes Conjunctivae clear. No discharge. Respiratory Respiratory effort is easy and symmetric bilaterally. Rate is normal at rest and on room air.. Cardiovascular Pedal pulses are easily palpable. Lymphatic None palpable in thePopliteal. Integumentary (Hair, Skin) There is no erythema around the wound area. Psychiatric No evidence of depression, anxiety, or agitation. Calm, cooperative, and communicative. Appropriate interactions and affect.. Notes Wound exam; right anterior tibia. Surgical wound with a very small open area superiorly. This was not the original open area that she  had. The rest of this wound is closed. She had a small nodular area superiorly I lanced this and felt it was a cyst this may just be scar tissue. Using a #3 curet I remove surface eschar still a very tiny open area but I expect this to close there is no evidence of surrounding infection Electronic Signature(s) Signed: 08/31/2018 4:50:38 PM By: Sally Ham MD Entered By: [name] on 08/31/2018 11:21:27 [name] ([id number]) -------------------------------------------------------------------------------- Physician Orders Details Patient Name: [name] Date of Service: 08/31/2018 11:00 AM Medical Record Number: [id number] Patient Account Number: [id number] Date of Birth/Sex: 12-Jul-1956 (62 y.o. F) Treating Anderson: [name] Primary Care Provider: Rory Anderson Other Clinician: Referring Provider: Rory Anderson Treating Provider/Extender: [name] in Treatment: 5 Verbal / Phone Orders: No Diagnosis Coding Anesthetic (add to Medication List) Wound #1 Right,Anterior Lower Leg o Topical Lidocaine 4% cream applied to wound bed prior to debridement (In Clinic Only). Primary Wound Dressing o Silver Collagen Secondary Dressing Wound #1 Right,Anterior Lower Leg o Boardered Foam Dressing Dressing Change Frequency Wound #1 Right,Anterior Lower Leg o Change dressing every other day. Follow-up Appointments Wound #1 Right,Anterior Lower Leg o Return Appointment in 2 weeks. o Nurse Visit as needed Electronic Signature(s) Signed: 08/31/2018 4:50:38 PM By: Sally Ham MD Signed: 08/31/2018 6:02:29 PM By: [name] BSN, Anderson, CWS, [name], BSN Entered By: [name], BSN, Anderson, CWS, Sally on 08/31/2018 11:08:56 [name] ([id number]) -------------------------------------------------------------------------------- Problem List Details Patient Name: [name] Date of Service: 08/31/2018 11:00 AM Medical Record Number: [id number] Patient Account Number:  [id number] Date of Birth/Sex: 16-Oct-1955 (62 y.o. F) Treating Anderson: [name] Primary Care Provider: Rory Anderson Other Clinician: Referring Provider: Rory Anderson Treating Provider/Extender: [name] in Treatment: 5 Active Problems ICD-10 Evaluated Encounter Code Description Active Date Today Diagnosis T81.31XD Disruption of external operation (surgical) wound, not 07/27/2018 No Yes elsewhere classified, subsequent encounter L97.211 Non-pressure chronic  ulcer of right calf limited to breakdown 07/27/2018 No Yes of skin C44.722 Squamous cell carcinoma of skin of right lower limb, including 07/27/2018 No Yes hip Inactive Problems Resolved Problems Electronic Signature(s) Signed: 08/31/2018 4:50:38 PM By: Sally Ham MD Entered By: [name] on 08/31/2018 11:15:46 [name], [name] ([id number]) -------------------------------------------------------------------------------- Progress Note Details Patient Name: [name] Date of Service: 08/31/2018 11:00 AM Medical Record Number: [id number] Patient Account Number: [id number] Date of Birth/Sex: [birth year] (62 y.o. F) Treating Anderson: [name] Primary Care Provider: Rory Anderson Other Clinician: Referring Provider: Rory Anderson Treating Provider/Extender: [name] in Treatment: 5 Subjective History of Present Illness (HPI) ADMISSION 07/27/18 This is a reasonably healthy 62 year old woman who lives in [place]. She cares for a disabled son with cerebral palsy and has parents in [place] who she is also the primary caregiver for. She gives a history of having a hyperkeratotic raised area for maybe as long as 1 or 2 years on the right anterior tibia. She was seen by her primary doctor by now we do not think this was anything serious. She also really came to the attention of [name] who is dermatology in [place] and apparently underwent liquid nitrogen therapy on 2 different  occasions but the lesion Coming Back. At [facility] Her Primary Doctor Did a Shave Biopsy That Showed Atypia and Ultimately Another Biopsy That Showed Squamous Cell Carcinoma. She was referred to the wound care center in [place] which apparently is being managed by [name] Who is a Education officer, environmental. He excised this area on 05/31/18 with pathology showing squamous cell carcinoma with extensive ulceration. The tumor appeared to be completely excised. The patient states that she had problems almost immediately after with swelling of her foot and was in the ER. One week later she had have the stitches removed and then 2 weeks later all the stitches were removed. She showed me a picture from this timeframe and perhaps there was a tiny open area in the mid part of the surgical wound. Apparently this is never really closed and the patient is frustrated she has not gotten specific instructions on how to deal with this. More recently she has a small raised area above the original surgical area that is tender looks like a small pimple although there is no pustular head to it. The patient does not have a history of any other skin lesions or skin cancers. 08/17/18;The patient returns with the same small nodule in the superior part of the incision and a small linear open area in the original incision area. She is very anxious. We have been using silver alginate 08/31/18; the patient still has a small open area on the tip of the surgical incision the rest of this is closed we've been using silver alginate but I changed her to soak her collagen today. She complains about swelling in her foot and some discomfort in the area. Objective Constitutional Sitting or standing Blood Pressure is within target range for patient.. Pulse regular and within target range for patient.Marland Kitchen Respirations regular, non-labored and within target range.. Temperature is normal and within the target range for the patient.Marland Kitchen appears in no  distress. Vitals Time Taken: 10:51 AM, Height: 66 in, Weight: 120 lbs, BMI: 19.4, Temperature: 97.5 F, Pulse: 99 bpm, Respiratory Rate: 16 breaths/min, Blood Pressure: 134/80 mmHg. Eyes Conjunctivae clear. No discharge. Anderson, Sally A. ([id number]) Respiratory Respiratory effort is easy and symmetric bilaterally. Rate is normal at rest and on room air.Marland Kitchen  Cardiovascular Pedal pulses are easily palpable. Lymphatic None palpable in thePopliteal. Psychiatric No evidence of depression, anxiety, or agitation. Calm, cooperative, and communicative. Appropriate interactions and affect.. General Notes: Wound exam; right anterior tibia. Surgical wound with a very small open area superiorly. This was not the original open area that she had. The rest of this wound is closed. She had a small nodular area superiorly I lanced this and felt it was a cyst this may just be scar tissue. Using a #3 curet I remove surface eschar still a very tiny open area but I expect this to close there is no evidence of surrounding infection Integumentary (Hair, Skin) There is no erythema around the wound area. Wound #1 status is Open. Original cause of wound was Surgical Injury. The wound is located on the Right,Anterior Lower Leg. The wound measures 0.1cm length x 0.1cm width x 0.1cm depth; 0.008cm^2 area and 0.001cm^3 volume. There is Fat Layer (Subcutaneous Tissue) Exposed exposed. There is no tunneling noted. There is a none present amount of drainage noted. The wound margin is flat and intact. There is no granulation within the wound bed. There is no necrotic tissue within the wound bed. The periwound skin appearance exhibited: Erythema. The periwound skin appearance did not exhibit: Callus, Crepitus, Excoriation, Induration, Rash, Scarring, Dry/Scaly, Maceration, Atrophie Blanche, Cyanosis, Ecchymosis, Hemosiderin Staining, Mottled, Pallor, Rubor. The surrounding wound skin color is noted with erythema which  is circumferential. Periwound temperature was noted as No Abnormality. The periwound has tenderness on palpation. Assessment Active Problems ICD-10 Disruption of external operation (surgical) wound, not elsewhere classified, subsequent encounter Non-pressure chronic ulcer of right calf limited to breakdown of skin Squamous cell carcinoma of skin of right lower limb, including hip Procedures Wound #1 Pre-procedure diagnosis of Wound #1 is an Open Surgical Wound located on the Right,Anterior Lower Leg . There was a Selective/Open Wound Non-Viable Tissue Debridement with a total area of 0.02 sq cm performed by Sally Dillon, MD. With the following instrument(s): Curette to remove Non-Viable tissue/material. Material removed includes Eschar after achieving pain control using Lidocaine. No specimens were taken. A time out was conducted at 11:07, prior to the start of the procedure. There was no bleeding. The procedure was tolerated well. Post Debridement Measurements: 0.1cm length x 0.2cm width x 0.2cm depth; 0.003cm^3 volume. Character of Wound/Ulcer Post Debridement requires further debridement. Post procedure Diagnosis Wound #1: Same as Pre-Procedure Anderson, Sally A. ([id number]) Plan Anesthetic (add to Medication List): Wound #1 Right,Anterior Lower Leg: Topical Lidocaine 4% cream applied to wound bed prior to debridement (In Clinic Only). Primary Wound Dressing: Silver Collagen Secondary Dressing: Wound #1 Right,Anterior Lower Leg: Boardered Foam Dressing Dressing Change Frequency: Wound #1 Right,Anterior Lower Leg: Change dressing every other day. Follow-up Appointments: Wound #1 Right,Anterior Lower Leg: Return Appointment in 2 weeks. Nurse Visit as needed #1 I'm changing to moisten silver collagen from silver alginate with a border foam dressing #2 follow-up 2 weeks should be closed #3 the patient complains of pain but there is no evidence of infection. This may be peripheral  nerve issues related to the surgery. #4 she also complained about edema in the foot I see no evidence of that #5 there is no evidence of a vascular issue here Electronic Signature(s) Signed: 08/31/2018 11:22:03 AM By: Sally Ham MD Entered By: [name] on 08/31/2018 11:22:02 [name] ([id number]) -------------------------------------------------------------------------------- SuperBill Details Patient Name: [name] Date of Service: 08/31/2018 Medical Record Number: [id number] Patient Account Number: [id number] Date of  Birth/Sex: 12/23/1955 (62 y.o. F) Treating Anderson: [name] Primary Care Provider: Rory Anderson Other Clinician: Referring Provider: Rory Anderson Treating Provider/Extender: [name] in Treatment: 5 Diagnosis Coding ICD-10 Codes Code Description T81.31XD Disruption of external operation (surgical) wound, not elsewhere classified, subsequent encounter L97.211 Non-pressure chronic ulcer of right calf limited to breakdown of skin C44.722 Squamous cell carcinoma of skin of right lower limb, including hip Facility Procedures CPT4: Description Modifier Quantity Code [id number] - DEBRIDE WOUND 1ST 20 SQ CM OR < 1 ICD-10 Diagnosis Description T81.31XD Disruption of external operation (surgical) wound, not elsewhere classified, subsequent encounter L97.211 Non-pressure  chronic ulcer of right calf limited to breakdown of skin Physician Procedures CPT4: Description Modifier Quantity Code [id number] - WC PHYS DEBR WO ANESTH 20 SQ CM 1 ICD-10 Diagnosis Description T81.31XD Disruption of external operation (surgical) wound, not elsewhere classified, subsequent encounter L97.211 Non-pressure  chronic ulcer of right calf limited to breakdown of skin Electronic Signature(s) Signed: 08/31/2018 4:50:38 PM By: Sally Ham MD Entered By: [name] on 08/31/2018 11:22:32

## 2018-09-14 ENCOUNTER — Encounter: Payer: BC Managed Care – PPO | Admitting: Internal Medicine

## 2018-09-14 DIAGNOSIS — L97211 Non-pressure chronic ulcer of right calf limited to breakdown of skin: Secondary | ICD-10-CM | POA: Diagnosis not present

## 2018-09-15 NOTE — Progress Notes (Signed)
Sally, Anderson ([id number]) Visit Report for 09/14/2018 HPI Details Patient Name: [name], [name] Date of Service: 09/14/2018 11:00 AM Medical Record Number: [id number] Patient Account Number: [id number] Date of Birth/Sex: 1956/01/17 (62 y.o. F) Treating RN: Cornell Barman Primary Care Provider: Rory Percy Other Clinician: Referring Provider: Rory Percy Treating Provider/Extender: Tito Dine in Treatment: 7 History of Present Illness HPI Description: ADMISSION 07/27/18 This is a reasonably healthy 62 year old woman who lives in [place]. She cares for a disabled son with cerebral palsy and has parents in [place] who she is also the primary caregiver for. She gives a history of having a hyperkeratotic raised area for maybe as long as 1 or 2 years on the right anterior tibia. She was seen by her primary doctor by now we do not think this was anything serious. She also really came to the attention of Dr. Nevada Crane who is dermatology in [place] and apparently underwent liquid nitrogen therapy on 2 different occasions but the lesion Coming Back. At [facility] Her Primary Doctor Did a Shave Biopsy That Showed Atypia and Ultimately Another Biopsy That Showed Squamous Cell Carcinoma. She was referred to the wound care center in [place] which apparently is being managed by Dr. Anthony Sar Who is a Education officer, environmental. He excised this area on 05/31/18 with pathology showing squamous cell carcinoma with extensive ulceration. The tumor appeared to be completely excised. The patient states that she had problems almost immediately after with swelling of her foot and was in the ER. One week later she had have the stitches removed and then 2 weeks later all the stitches were removed. She showed me a picture from this timeframe and perhaps there was a tiny open area in the mid part of the surgical wound. Apparently this is never really closed and the patient is frustrated she has not gotten  specific instructions on how to deal with this. More recently she has a small raised area above the original surgical area that is tender looks like a small pimple although there is no pustular head to it. The patient does not have a history of any other skin lesions or skin cancers. 08/17/18;The patient returns with the same small nodule in the superior part of the incision and a small linear open area in the original incision area. She is very anxious. We have been using silver alginate 08/31/18; the patient still has a small open area on the tip of the surgical incision the rest of this is closed we've been using silver alginate but I changed her to soak her collagen today. She complains about swelling in her foot and some discomfort in the area. 09/14/2018; all of the surgical incision on the right mid tibial area is closed. She complains about some swelling in her foot and shows me a picture. This is mostly dependent and worse at night probably represents venous insufficiency Electronic Signature(s) Signed: 09/14/2018 5:55:34 PM By: Linton Ham MD Entered By: Linton Ham on 09/14/2018 11:06:25 [name] ([id number]) -------------------------------------------------------------------------------- Physical Exam Details Patient Name: [name] Date of Service: 09/14/2018 11:00 AM Medical Record Number: [id number] Patient Account Number: [id number] Date of Birth/Sex: 1956/08/19 (62 y.o. F) Treating RN: Cornell Barman Primary Care Provider: Rory Percy Other Clinician: Referring Provider: Rory Percy Treating Provider/Extender: Tito Dine in Treatment: 7 Constitutional Sitting or standing Blood Pressure is within target range for patient.. Pulse regular and within target range for patient.Marland Kitchen Respirations regular, non-labored and within target range.. Temperature is  normal and within the target range for the patient.Marland Kitchen appears in no distress. Eyes Conjunctivae  clear. No discharge. Respiratory Respiratory effort is easy and symmetric bilaterally. Rate is normal at rest and on room air.. Cardiovascular Pedal pulses palpable and strong bilaterally.. Patient has no edema in the right foot or ankle. Lymphatic None palpable in the popliteal fossa on the right. Integumentary (Hair, Skin) No primary skin condition is seen. Psychiatric No evidence of depression, anxiety, or agitation. Calm, cooperative, and communicative. Appropriate interactions and affect.. Notes Wound exam; right anterior tibial surgical scar. All of this is closed. She had a small raised area superiorly and this I think this was probably scar tissue it is also not evident today. Electronic Signature(s) Signed: 09/14/2018 5:55:34 PM By: Linton Ham MD Entered By: Linton Ham on 09/14/2018 11:08:11 [name] ([id number]) -------------------------------------------------------------------------------- Physician Orders Details Patient Name: [name] Date of Service: 09/14/2018 11:00 AM Medical Record Number: [id number] Patient Account Number: [id number] Date of Birth/Sex: 07-02-1956 (62 y.o. F) Treating RN: Cornell Barman Primary Care Provider: Rory Percy Other Clinician: Referring Provider: Rory Percy Treating Provider/Extender: Tito Dine in Treatment: 7 Verbal / Phone Orders: No Diagnosis Coding Discharge From [facility] Services o Discharge from [place] complete. Electronic Signature(s) Signed: 09/14/2018 5:32:05 PM By: Gretta Cool, BSN, RN, CWS, Kim RN, BSN Signed: 09/14/2018 5:55:34 PM By: Linton Ham MD Entered By: Gretta Cool, BSN, RN, CWS, Kim on 09/14/2018 10:56:11 DEVONE, BONILLA ([id number]) -------------------------------------------------------------------------------- Problem List Details Patient Name: [name] Date of Service: 09/14/2018 11:00 AM Medical Record Number: [id number] Patient Account Number: [id number] Date  of Birth/Sex: [birth year] (62 y.o. F) Treating RN: Cornell Barman Primary Care Provider: Rory Percy Other Clinician: Referring Provider: Rory Percy Treating Provider/Extender: Tito Dine in Treatment: 7 Active Problems ICD-10 Evaluated Encounter Code Description Active Date Today Diagnosis T81.31XD Disruption of external operation (surgical) wound, not 07/27/2018 No Yes elsewhere classified, subsequent encounter L97.211 Non-pressure chronic ulcer of right calf limited to breakdown 07/27/2018 No Yes of skin C44.722 Squamous cell carcinoma of skin of right lower limb, including 07/27/2018 No Yes hip Inactive Problems Resolved Problems Electronic Signature(s) Signed: 09/14/2018 5:55:34 PM By: Linton Ham MD Entered By: Linton Ham on 09/14/2018 11:05:24 [name] ([id number]) -------------------------------------------------------------------------------- Progress Note Details Patient Name: [name] Date of Service: 09/14/2018 11:00 AM Medical Record Number: [id number] Patient Account Number: [id number] Date of Birth/Sex: 08/02/1956 (62 y.o. F) Treating RN: Cornell Barman Primary Care Provider: Rory Percy Other Clinician: Referring Provider: Rory Percy Treating Provider/Extender: Tito Dine in Treatment: 7 Subjective History of Present Illness (HPI) ADMISSION 07/27/18 This is a reasonably healthy 62 year old woman who lives in [place]. She cares for a disabled son with cerebral palsy and has parents in [place] who she is also the primary caregiver for. She gives a history of having a hyperkeratotic raised area for maybe as long as 1 or 2 years on the right anterior tibia. She was seen by her primary doctor by now we do not think this was anything serious. She also really came to the attention of Dr. [place] Crane who is dermatology in [place] and apparently underwent liquid nitrogen therapy on 2 different occasions but  the lesion Coming Back. At [facility] Her Primary Doctor Did a Shave Biopsy That Showed Atypia and Ultimately Another Biopsy That Showed Squamous Cell Carcinoma. She was referred to the wound care center in [place] which apparently is being managed by Dr. Anthony Sar Who  is a Education officer, environmental. He excised this area on 05/31/18 with pathology showing squamous cell carcinoma with extensive ulceration. The tumor appeared to be completely excised. The patient states that she had problems almost immediately after with swelling of her foot and was in the ER. One week later she had have the stitches removed and then 2 weeks later all the stitches were removed. She showed me a picture from this timeframe and perhaps there was a tiny open area in the mid part of the surgical wound. Apparently this is never really closed and the patient is frustrated she has not gotten specific instructions on how to deal with this. More recently she has a small raised area above the original surgical area that is tender looks like a small pimple although there is no pustular head to it. The patient does not have a history of any other skin lesions or skin cancers. 08/17/18;The patient returns with the same small nodule in the superior part of the incision and a small linear open area in the original incision area. She is very anxious. We have been using silver alginate 08/31/18; the patient still has a small open area on the tip of the surgical incision the rest of this is closed we've been using silver alginate but I changed her to soak her collagen today. She complains about swelling in her foot and some discomfort in the area. 09/14/2018; all of the surgical incision on the right mid tibial area is closed. She complains about some swelling in her foot and shows me a picture. This is mostly dependent and worse at night probably represents venous insufficiency Objective Constitutional Sitting or standing Blood Pressure is within  target range for patient.. Pulse regular and within target range for patient.Marland Kitchen Respirations regular, non-labored and within target range.. Temperature is normal and within the target range for the patient.Marland Kitchen appears in no distress. Vitals Time Taken: 10:45 AM, Height: 66 in, Weight: 120 lbs, BMI: 19.4, Temperature: 97.8 F, Pulse: 103 bpm, Respiratory Rate: 18 breaths/min, Blood Pressure: 131/66 mmHg. Saulnier, Jen A. ([id number]) Eyes Conjunctivae clear. No discharge. Respiratory Respiratory effort is easy and symmetric bilaterally. Rate is normal at rest and on room air.. Cardiovascular Pedal pulses palpable and strong bilaterally.. Patient has no edema in the right foot or ankle. Lymphatic None palpable in the popliteal fossa on the right. Psychiatric No evidence of depression, anxiety, or agitation. Calm, cooperative, and communicative. Appropriate interactions and affect.. General Notes: Wound exam; right anterior tibial surgical scar. All of this is closed. She had a small raised area superiorly and this I think this was probably scar tissue it is also not evident today. Integumentary (Hair, Skin) No primary skin condition is seen. Wound #1 status is Healed - Epithelialized. Original cause of wound was Surgical Injury. The wound is located on the Right,Anterior Lower Leg. The wound measures 0cm length x 0cm width x 0cm depth; 0cm^2 area and 0cm^3 volume. There is Fat Layer (Subcutaneous Tissue) Exposed exposed. There is no tunneling or undermining noted. There is a none present amount of drainage noted. The wound margin is flat and intact. There is no granulation within the wound bed. There is no necrotic tissue within the wound bed. The periwound skin appearance exhibited: Dry/Scaly, Erythema. The periwound skin appearance did not exhibit: Callus, Crepitus, Excoriation, Induration, Rash, Scarring, Maceration, Atrophie Blanche, Cyanosis, Ecchymosis, Hemosiderin Staining, Mottled, Pallor,  Rubor. The surrounding wound skin color is noted with erythema which is circumferential. Periwound temperature was noted as  No Abnormality. The periwound has tenderness on palpation. Assessment Active Problems ICD-10 Disruption of external operation (surgical) wound, not elsewhere classified, subsequent encounter Non-pressure chronic ulcer of right calf limited to breakdown of skin Squamous cell carcinoma of skin of right lower limb, including hip Plan Discharge From [facility] Services: Discharge from [place] complete. 1. The patient can be discharged from the wound care center. 2. This was originally a surgical wound for squamous cell carcinoma. Chagnon, Micheal A. ([id number]) 3. There is no evidence of recurrence and the wound is closed. 4. The patient was concerned about lower extremity edema in her foot I think this is probably mostly venous insufficiency. It is dependent worse at night and gone away in the morning. I recommended support stockings for now Electronic Signature(s) Signed: 09/14/2018 5:55:34 PM By: Linton Ham MD Entered By: Linton Ham on 09/14/2018 11:10:05 [name] ([id number]) -------------------------------------------------------------------------------- SuperBill Details Patient Name: [name] Date of Service: 09/14/2018 Medical Record Number: [id number] Patient Account Number: [id number] Date of Birth/Sex: 24-Apr-1956 (62 y.o. F) Treating RN: Cornell Barman Primary Care Provider: Rory Percy Other Clinician: Referring Provider: Rory Percy Treating Provider/Extender: Tito Dine in Treatment: 7 Diagnosis Coding ICD-10 Codes Code Description T81.31XD Disruption of external operation (surgical) wound, not elsewhere classified, subsequent encounter L97.211 Non-pressure chronic ulcer of right calf limited to breakdown of skin C44.722 Squamous cell carcinoma of skin of right lower limb, including hip Facility  Procedures CPT4 Code: [id number] Description: [phone] - WOUND CARE VISIT-LEV 2 EST PT Modifier: Quantity: 1 Physician Procedures CPT4: Description Modifier Quantity Code [id number] - WC PHYS LEVEL 3 - EST PT 1 ICD-10 Diagnosis Description T81.31XD Disruption of external operation (surgical) wound, not elsewhere classified, subsequent encounter L97.211 Non-pressure chronic ulcer  of right calf limited to breakdown of skin Electronic Signature(s) Signed: 09/14/2018 5:55:34 PM By: Linton Ham MD Entered By: Linton Ham on 09/14/2018 11:19:04

## 2018-09-21 NOTE — Progress Notes (Signed)
NINI, CAVAN ([id number]) Visit Report for 09/14/2018 Arrival Information Details Patient Name: [name], [name] Date of Service: 09/14/2018 11:00 AM Medical Record Number: [id number] Patient Account Number: [id number] Date of Birth/Sex: Feb 03, 1956 (62 y.o. F) Treating RN: Harold Barban Primary Care Hassan Blackshire: Rory Percy Other Clinician: Referring Konstantina Nachreiner: Rory Percy Treating Emmarose Klinke/Extender: Tito Dine in Treatment: 7 Visit Information History Since Last Visit Added or deleted any medications: No Patient Arrived: Ambulatory Any new allergies or adverse reactions: No Arrival Time: 10:41 Had a fall or experienced change in No Accompanied By: self activities of daily living that may affect Transfer Assistance: None risk of falls: Patient Identification Verified: Yes Signs or symptoms of abuse/neglect since last visito No Secondary Verification Process Completed: Yes Hospitalized since last visit: No Has Dressing in Place as Prescribed: Yes Pain Present Now: No Electronic Signature(s) Signed: 09/20/2018 1:37:43 PM By: Harold Barban Entered By: Harold Barban on 09/14/2018 10:45:26 Sipe, Rondel Jumbo ([id number]) -------------------------------------------------------------------------------- Clinic Level of Care Assessment Details Patient Name: [name] Date of Service: 09/14/2018 11:00 AM Medical Record Number: [id number] Patient Account Number: [id number] Date of Birth/Sex: Aug 07, 1956 (62 y.o. F) Treating RN: Cornell Barman Primary Care Gurney Balthazor: Rory Percy Other Clinician: Referring Anes Rigel: Rory Percy Treating Adonis Yim/Extender: Tito Dine in Treatment: 7 Clinic Level of Care Assessment Items TOOL 4 Quantity Score []  - Use when only an EandM is performed on FOLLOW-UP visit 0 ASSESSMENTS - Nursing Assessment / Reassessment []  - Reassessment of Co-morbidities (includes updates in patient status) 0 X- 1 5 Reassessment of Adherence to  Treatment Plan ASSESSMENTS - Wound and Skin Assessment / Reassessment X - Simple Wound Assessment / Reassessment - one wound 1 5 []  - 0 Complex Wound Assessment / Reassessment - multiple wounds []  - 0 Dermatologic / Skin Assessment (not related to wound area) ASSESSMENTS - Focused Assessment []  - Circumferential Edema Measurements - multi extremities 0 []  - 0 Nutritional Assessment / Counseling / Intervention []  - 0 Lower Extremity Assessment (monofilament, tuning fork, pulses) []  - 0 Peripheral Arterial Disease Assessment (using hand held doppler) ASSESSMENTS - Ostomy and/or Continence Assessment and Care []  - Incontinence Assessment and Management 0 []  - 0 Ostomy Care Assessment and Management (repouching, etc.) PROCESS - Coordination of Care X - Simple Patient / Family Education for ongoing care 1 15 []  - 0 Complex (extensive) Patient / Family Education for ongoing care []  - 0 Staff obtains Programmer, systems, Records, Test Results / Process Orders []  - 0 Staff telephones HHA, Nursing Homes / Clarify orders / etc []  - 0 Routine Transfer to another Facility (non-emergent condition) []  - 0 Routine Hospital Admission (non-emergent condition) []  - 0 New Admissions / Biomedical engineer / Ordering NPWT, Apligraf, etc. []  - 0 Emergency Hospital Admission (emergent condition) X- 1 10 Simple Discharge Coordination Goeller, Tinna A. ([id number]) []  - 0 Complex (extensive) Discharge Coordination PROCESS - Special Needs []  - Pediatric / Minor Patient Management 0 []  - 0 Isolation Patient Management []  - 0 Hearing / Language / Visual special needs []  - 0 Assessment of Community assistance (transportation, D/C planning, etc.) []  - 0 Additional assistance / Altered mentation []  - 0 Support Surface(s) Assessment (bed, cushion, seat, etc.) INTERVENTIONS - Wound Cleansing / Measurement X - Simple Wound Cleansing - one wound 1 5 []  - 0 Complex Wound Cleansing - multiple wounds X- 1  5 Wound Imaging (photographs - any number of wounds) []  - 0 Wound Tracing (instead of photographs) X- 1 5 Simple Wound Measurement -  one wound []  - 0 Complex Wound Measurement - multiple wounds INTERVENTIONS - Wound Dressings X - Small Wound Dressing one or multiple wounds 1 10 []  - 0 Medium Wound Dressing one or multiple wounds []  - 0 Large Wound Dressing one or multiple wounds []  - 0 Application of Medications - topical []  - 0 Application of Medications - injection INTERVENTIONS - Miscellaneous []  - External ear exam 0 []  - 0 Specimen Collection (cultures, biopsies, blood, body fluids, etc.) []  - 0 Specimen(s) / Culture(s) sent or taken to Lab for analysis []  - 0 Patient Transfer (multiple staff / Civil Service fast streamer / Similar devices) []  - 0 Simple Staple / Suture removal (25 or less) []  - 0 Complex Staple / Suture removal (26 or more) []  - 0 Hypo / Hyperglycemic Management (close monitor of Blood Glucose) []  - 0 Ankle / Brachial Index (ABI) - do not check if billed separately X- 1 5 Vital Signs Escoe, Jazzlene A. ([id number]) Has the patient been seen at the hospital within the last three years: Yes Total Score: 65 Level Of Care: New/Established - Level 2 Electronic Signature(s) Signed: 09/14/2018 5:32:05 PM By: Gretta Cool, BSN, RN, CWS, Kim RN, BSN Entered By: Gretta Cool, BSN, RN, CWS, Kim on 09/14/2018 10:56:57 [name] ([id number]) -------------------------------------------------------------------------------- Encounter Discharge Information Details Patient Name: [name] Date of Service: 09/14/2018 11:00 AM Medical Record Number: [id number] Patient Account Number: [id number] Date of Birth/Sex: 1956/05/08 (62 y.o. F) Treating RN: Cornell Barman Primary Care Nevea Spiewak: Rory Percy Other Clinician: Referring Madolin Twaddle: Rory Percy Treating Demarlo Riojas/Extender: Tito Dine in Treatment: 7 Encounter Discharge Information Items Discharge Condition: Stable Ambulatory  Status: Ambulatory Discharge Destination: Home Transportation: Private Auto Accompanied By: self Schedule Follow-up Appointment: Yes Clinical Summary of Care: Electronic Signature(s) Signed: 09/14/2018 5:32:05 PM By: Gretta Cool, BSN, RN, CWS, Kim RN, BSN Entered By: Gretta Cool, BSN, RN, CWS, Kim on 09/14/2018 10:58:07 [name] ([id number]) -------------------------------------------------------------------------------- Lower Extremity Assessment Details Patient Name: [name] Date of Service: 09/14/2018 11:00 AM Medical Record Number: [id number] Patient Account Number: [id number] Date of Birth/Sex: [birth year] (62 y.o. F) Treating RN: Harold Barban Primary Care Zygmund Passero: Rory Percy Other Clinician: Referring Briarrose Shor: Rory Percy Treating Mirriam Vadala/Extender: Tito Dine in Treatment: 7 Vascular Assessment Pulses: Dorsalis Pedis Palpable: [Right:Yes] Posterior Tibial Palpable: [Right:Yes] Extremity colors, hair growth, and conditions: Hair Growth on Extremity: [Right:Yes] Temperature of Extremity: [Right:Warm] Capillary Refill: [Right:< 3 seconds] Toe Nail Assessment Left: Right: Thick: No Discolored: No Deformed: No Improper Length and Hygiene: No Electronic Signature(s) Signed: 09/20/2018 1:37:43 PM By: Harold Barban Entered By: Harold Barban on 09/14/2018 10:50:30 [name] ([id number]) -------------------------------------------------------------------------------- Multi Wound Chart Details Patient Name: [name] Date of Service: 09/14/2018 11:00 AM Medical Record Number: [id number] Patient Account Number: [id number] Date of Birth/Sex: August 29, 1956 (62 y.o. F) Treating RN: Cornell Barman Primary Care Alinna Siple: Rory Percy Other Clinician: Referring Macie Baum: Rory Percy Treating Deverick Pruss/Extender: Tito Dine in Treatment: 7 Vital Signs Height(in): 66 Pulse(bpm): 103 Weight(lbs): 120 Blood Pressure(mmHg): 131/66 Body Mass  Index(BMI): 19 Temperature(F): 97.8 Respiratory Rate 18 (breaths/min): Photos: [1:No Photos] [N/A:N/A] Wound Location: [1:Right, Anterior Lower Leg] [N/A:N/A] Wounding Event: [1:Surgical Injury] [N/A:N/A] Primary Etiology: [1:Open Surgical Wound] [N/A:N/A] Date Acquired: [1:05/23/2018] [N/A:N/A] Weeks of Treatment: [1:7] [N/A:N/A] Wound Status: [1:Healed - Epithelialized] [N/A:N/A] Measurements L x W x D [1:0x0x0] [N/A:N/A] (cm) Area (cm) : [1:0] [N/A:N/A] Volume (cm) : [1:0] [N/A:N/A] % Reduction in Area: [1:100.00%] [N/A:N/A] % Reduction in Volume: [1:100.00%] [N/A:N/A] Classification: [1:Full Thickness Without  Exposed Support Structures] [N/A:N/A] Exudate Amount: [1:None Present] [N/A:N/A] Wound Margin: [1:Flat and Intact] [N/A:N/A] Granulation Amount: [1:None Present (0%)] [N/A:N/A] Necrotic Amount: [1:None Present (0%)] [N/A:N/A] Exposed Structures: [1:Fat Layer (Subcutaneous Tissue) Exposed: Yes Fascia: No Tendon: No Muscle: No Joint: No Bone: No] [N/A:N/A] Epithelialization: [1:None] [N/A:N/A] Periwound Skin Texture: [1:Excoriation: No Induration: No Callus: No Crepitus: No Rash: No Scarring: No] [N/A:N/A] Periwound Skin Moisture: [1:Dry/Scaly: Yes Maceration: No] [N/A:N/A] Periwound Skin Color: [1:Erythema: Yes Atrophie Blanche: No] [N/A:N/A] Cyanosis: No Ecchymosis: No Hemosiderin Staining: No Mottled: No Pallor: No Rubor: No Erythema Location: Circumferential N/A N/A Temperature: No Abnormality N/A N/A Tenderness on Palpation: Yes N/A N/A Wound Preparation: Ulcer Cleansing: N/A N/A Rinsed/Irrigated with Saline Topical Anesthetic Applied: Other: lidocaine 4% Treatment Notes Electronic Signature(s) Signed: 09/14/2018 5:55:34 PM By: Linton Ham MD Entered By: Linton Ham on 09/14/2018 11:05:29 [name] ([id number]) -------------------------------------------------------------------------------- Multi-Disciplinary Care Plan Details Patient  Name: [name] Date of Service: 09/14/2018 11:00 AM Medical Record Number: [id number] Patient Account Number: [id number] Date of Birth/Sex: 12-Dec-1955 (62 y.o. F) Treating RN: Cornell Barman Primary Care Lailoni Baquera: Rory Percy Other Clinician: Referring Lem Peary: Rory Percy Treating Mackie Goon/Extender: Tito Dine in Treatment: 7 Active Inactive Electronic Signature(s) Signed: 09/14/2018 5:32:05 PM By: Gretta Cool, BSN, RN, CWS, Kim RN, BSN Entered By: Gretta Cool, BSN, RN, CWS, Kim on 09/14/2018 11:28:30 [name] ([id number]) -------------------------------------------------------------------------------- Pain Assessment Details Patient Name: [name] Date of Service: 09/14/2018 11:00 AM Medical Record Number: [id number] Patient Account Number: [id number] Date of Birth/Sex: [birth year] (62 y.o. F) Treating RN: Harold Barban Primary Care Zeplin Aleshire: Rory Percy Other Clinician: Referring Lisa-Marie Rueger: Rory Percy Treating Edouard Gikas/Extender: Tito Dine in Treatment: 7 Active Problems Location of Pain Severity and Description of Pain Patient Has Paino No Site Locations Pain Management and Medication Current Pain Management: Electronic Signature(s) Signed: 09/20/2018 1:37:43 PM By: Harold Barban Entered By: Harold Barban on 09/14/2018 10:45:33 [name] ([id number]) -------------------------------------------------------------------------------- Patient/Caregiver Education Details Patient Name: [name] Date of Service: 09/14/2018 11:00 AM Medical Record Number: [id number] Patient Account Number: [id number] Date of Birth/Gender: 07-18-1956 (62 y.o. F) Treating RN: Cornell Barman Primary Care Physician: Rory Percy Other Clinician: Referring Physician: Rory Percy Treating Physician/Extender: Tito Dine in Treatment: 7 Education Assessment Education Provided To: Patient Education Topics Provided Basic Hygiene: Handouts: Other:  moisturize daily Methods: Explain/Verbal Responses: State content correctly Electronic Signature(s) Signed: 09/14/2018 5:32:05 PM By: Gretta Cool, BSN, RN, CWS, Kim RN, BSN Entered By: Gretta Cool, BSN, RN, CWS, Kim on 09/14/2018 10:57:42 [name] ([id number]) -------------------------------------------------------------------------------- Wound Assessment Details Patient Name: [name] Date of Service: 09/14/2018 11:00 AM Medical Record Number: [id number] Patient Account Number: [id number] Date of Birth/Sex: 09/18/1956 (62 y.o. F) Treating RN: Cornell Barman Primary Care Jennae Hakeem: Rory Percy Other Clinician: Referring Gay Rape: Rory Percy Treating Kazoua Gossen/Extender: Tito Dine in Treatment: 7 Wound Status Wound Number: 1 Primary Etiology: Open Surgical Wound Wound Location: Right, Anterior Lower Leg Wound Status: Healed - Epithelialized Wounding Event: Surgical Injury Date Acquired: 05/23/2018 Weeks Of Treatment: 7 Clustered Wound: No Photos Photo Uploaded By: Harold Barban on 09/14/2018 11:57:48 Wound Measurements Length: (cm) 0 Width: (cm) 0 Depth: (cm) 0 Area: (cm) 0 Volume: (cm) 0 % Reduction in Area: 100% % Reduction in Volume: 100% Epithelialization: None Tunneling: No Undermining: No Wound Description Full Thickness Without Exposed Support Classification: Structures Wound Margin: Flat and Intact Exudate None Present Amount: Foul Odor After Cleansing: No Slough/Fibrino Yes Wound Bed Granulation Amount: None Present (0%) Exposed  Structure Necrotic Amount: None Present (0%) Fascia Exposed: No Fat Layer (Subcutaneous Tissue) Exposed: Yes Tendon Exposed: No Muscle Exposed: No Joint Exposed: No Bone Exposed: No Periwound Skin Texture Texture Color Fluharty, Corey A. ([id number]) No Abnormalities Noted: No No Abnormalities Noted: No Callus: No Atrophie Blanche: No Crepitus: No Cyanosis: No Excoriation: No Ecchymosis: No Induration:  No Erythema: Yes Rash: No Erythema Location: Circumferential Scarring: No Hemosiderin Staining: No Mottled: No Moisture Pallor: No No Abnormalities Noted: No Rubor: No Dry / Scaly: Yes Maceration: No Temperature / Pain Temperature: No Abnormality Tenderness on Palpation: Yes Wound Preparation Ulcer Cleansing: Rinsed/Irrigated with Saline Topical Anesthetic Applied: Other: lidocaine 4%, Electronic Signature(s) Signed: 09/14/2018 5:32:05 PM By: Gretta Cool, BSN, RN, CWS, Kim RN, BSN Entered By: Gretta Cool, BSN, RN, CWS, Kim on 09/14/2018 10:55:00 [name] ([id number]) -------------------------------------------------------------------------------- Vitals Details Patient Name: [name] Date of Service: 09/14/2018 11:00 AM Medical Record Number: [id number] Patient Account Number: [id number] Date of Birth/Sex: 06-05-1956 (62 y.o. F) Treating RN: Harold Barban Primary Care Olan Kurek: Rory Percy Other Clinician: Referring Lupe Bonner: Rory Percy Treating Othella Slappey/Extender: Tito Dine in Treatment: 7 Vital Signs Time Taken: 10:45 Temperature (F): 97.8 Height (in): 66 Pulse (bpm): 103 Weight (lbs): 120 Respiratory Rate (breaths/min): 18 Body Mass Index (BMI): 19.4 Blood Pressure (mmHg): 131/66 Reference Range: 80 - 120 mg / dl Electronic Signature(s) Signed: 09/20/2018 1:37:43 PM By: Harold Barban Entered By: Harold Barban on 09/14/2018 10:45:52

## 2018-11-08 ENCOUNTER — Other Ambulatory Visit: Payer: Self-pay

## 2018-11-08 DIAGNOSIS — R609 Edema, unspecified: Secondary | ICD-10-CM

## 2018-12-16 ENCOUNTER — Encounter: Payer: Self-pay | Admitting: Vascular Surgery

## 2018-12-16 ENCOUNTER — Other Ambulatory Visit: Payer: Self-pay

## 2018-12-16 ENCOUNTER — Encounter (HOSPITAL_COMMUNITY): Payer: BC Managed Care – PPO

## 2018-12-16 ENCOUNTER — Ambulatory Visit: Payer: BC Managed Care – PPO | Admitting: Vascular Surgery

## 2018-12-16 ENCOUNTER — Encounter: Payer: BC Managed Care – PPO | Admitting: Vascular Surgery

## 2018-12-16 ENCOUNTER — Ambulatory Visit (HOSPITAL_COMMUNITY)
Admission: RE | Admit: 2018-12-16 | Discharge: 2018-12-16 | Disposition: A | Discharge status: Home / Self Care | Payer: BC Managed Care – PPO | Source: Ambulatory Visit | Attending: Vascular Surgery | Admitting: Vascular Surgery

## 2018-12-16 VITALS — BP 121/75 | HR 100 | Temp 98.0°F | Resp 20 | Ht 65.0 in | Wt 112.0 lb

## 2018-12-16 DIAGNOSIS — R609 Edema, unspecified: Secondary | ICD-10-CM

## 2018-12-16 NOTE — Progress Notes (Signed)
Patient ID: [name], female   DOB: Feb 15, 1956, 63 y.o.   MRN: [id number]  Reason for Consult: New Patient (Initial Visit)   Referred by Rory Percy, MD  Subjective:     HPI:  [name] is a 63 y.o. female presents for evaluation of right lower extremity swelling.  She underwent surgical biopsy of squamous cell carcinoma last September.  Since that time she has had swelling only distal to this involving her foot.  She has attempted to wear compression stockings but most of these have been too tight.  She notes that the swelling is worse throughout the day.  She does walk but swelling gets worse.  She does have a scheduled appointment with dermatology in April has not seen a dermatologist.  She did undergo wound care with Dr. Dellia Nims to get the wound to heal after some debridement.  She is never had a history of DVT either personally or in her family.  She does not take blood thinners.  Past Medical History:  Diagnosis Date  . Hyperlipidemia   . Osteopenia    Family History  Problem Relation Age of Onset  . Colon cancer Cousin    Past Surgical History:  Procedure Laterality Date  . COLONOSCOPY N/A 09/17/2017   Procedure: COLONOSCOPY;  Surgeon: Daneil Dolin, MD;  Location: [facility];  Service: Endoscopy;  Laterality: N/A;  1:00PM  . TONSILLECTOMY      Short Social History:  Social History   Tobacco Use  . Smoking status: Never Smoker  . Smokeless tobacco: Never Used  Substance Use Topics  . Alcohol use: No    Frequency: Never    Allergies  Allergen Reactions  . Amoxicillin     "Made sick"-high dose  . Bactrim [Sulfamethoxazole-Trimethoprim] Hives  . Keflex [Cephalexin] Hives  . Hydrocodone Nausea Only    Current Outpatient Medications  Medication Sig Dispense Refill  . atorvastatin (LIPITOR) 10 MG tablet Take 0.5 tablets daily by mouth.  1  . Calcium Polycarbophil (FIBER-CAPS PO) Take 1 capsule by mouth daily.    . Calcium-Phosphorus-Vitamin D (CITRACAL  +D3 PO) Take 2 tablets by mouth 2 (two) times daily.    . Carboxymethylcellul-Glycerin (REFRESH OPTIVE OP) Place 1 drop into both eyes 2 (two) times daily. Refresh Repair    . cycloSPORINE (RESTASIS) 0.05 % ophthalmic emulsion Place 1 drop into both eyes 2 (two) times daily.    Marland Kitchen estradiol (ESTRACE VAGINAL) 0.1 MG/GM vaginal cream Place 1 Applicatorful 2 (two) times a week vaginally.     Marland Kitchen Lifitegrast (XIIDRA) 5 % SOLN Place 1 drop into both eyes 2 (two) times daily. Shirley Friar    . linaclotide (LINZESS) 145 MCG CAPS capsule Take 145 mcg by mouth daily before breakfast.    . Multiple Vitamins-Minerals (MULTIVITAMIN WITH MINERALS) tablet Take 1 tablet by mouth daily.    . nabumetone (RELAFEN) 500 MG tablet Take 500 mg by mouth 2 (two) times daily as needed for mild pain.     . valACYclovir (VALTREX) 1000 MG tablet Take 500 mg by mouth 2 (two) times daily as needed (fever blisters).      No current facility-administered medications for this visit.     Review of Systems  Constitutional:  Constitutional negative. HENT: HENT negative.  Eyes: Eyes negative.  Respiratory: Respiratory negative.  Cardiovascular: Positive for leg swelling.  GI: Gastrointestinal negative.  Musculoskeletal: Musculoskeletal negative.  Skin: Skin negative.  Neurological: Neurological negative. Hematologic: Hematologic/lymphatic negative.  Psychiatric: Psychiatric negative.  Objective:  Objective   Vitals:   12/16/18 1053  BP: 121/75  Pulse: 100  Resp: 20  Temp: 98 F (36.7 C)  SpO2: 100%  Weight: 112 lb (50.8 kg)  Height: 5\' 5"  (1.651 m)   Body mass index is 18.64 kg/m.  Physical Exam Constitutional:      Appearance: Normal appearance.  HENT:     Head: Normocephalic.  Eyes:     Pupils: Pupils are equal, round, and reactive to light.  Neck:     Musculoskeletal: Normal range of motion.  Cardiovascular:     Rate and Rhythm: Normal rate and regular rhythm.     Pulses:          Radial pulses  are 2+ on the right side and 2+ on the left side.       Dorsalis pedis pulses are 2+ on the right side and 2+ on the left side.  Pulmonary:     Effort: Pulmonary effort is normal.  Abdominal:     General: Abdomen is flat.     Palpations: Abdomen is soft.  Musculoskeletal: Normal range of motion.     Comments: Currently her right foot is not swollen but she does have pictures that demonstrates swelling that appears to be nonpitting and 2+  Skin:    General: Skin is warm and dry.     Capillary Refill: Capillary refill takes less than 2 seconds.  Neurological:     General: No focal deficit present.     Mental Status: She is alert.  Psychiatric:        Mood and Affect: Mood normal.        Behavior: Behavior normal.        Thought Content: Thought content normal.        Judgment: Judgment normal.     Data: I have independently interpreted her reflux study of her right lower extremity which demonstrates reflux noted from the common femoral down to the popliteal vein as well as the great saphenous vein at the femoral junction thigh and mid thigh down to the level of the knee and proximal calf without any evidence of DVT.  Superficial reflux is high as 3220 ms at the saphenofemoral junction where the diameter is 0.37 cm and that is the greatest diameter of her saphenous vein     Assessment/Plan:     63 year old female presents for evaluation of right foot swelling after having surgical biopsy of a squamous cell carcinoma that was slow to heal and required debridement as well as the care from Dr. Dellia Nims.  She has a attempted compression stockings but most of these were too tight.  I think swelling is likely secondary to delayed healing process possible interruption of her lymphatics as her veins appear to be minimal contributors and are too small for ablation.  She has arterial system intact by palpation.  I suggested to her continued walking as well as elevation of her legs when recumbent.  I  discussed with her wearing gentle compression that she could either by over-the-counter or online and have also given her information on elastic therapy where she could see more options may be wearing only mild compression 10 to 15 mmHg.  She does demonstrate good understanding and can follow-up on an as-needed basis.     Waynetta Sandy MD [facility]

## 2019-09-19 ENCOUNTER — Encounter: Payer: Self-pay | Admitting: Gastroenterology

## 2019-09-19 NOTE — Progress Notes (Signed)
Primary Care Physician: Rory Percy, MD  Primary Gastroenterologist:  Garfield Cornea, MD  Chief Complaint  Patient presents with  . Bloated    burping  . Abdominal Pain    left side, started 12/9    HPI: [name] is a 63 y.o. female here for evaluation of burping, left-sided abdominal pain.  Last seen February 2019.  History of gas and bloating.  Previously celiac markers are negative.  Colonoscopy in December 2018 unremarkable.  Weight is down about 10 pounds since February 2019.  States she lost a lot of weight over the summer time, actually got down to 105.  Has gained a few pounds.  She wonders if her weight loss was stress related although she reports that she typically has a lot of stress chronically.  She takes care of her disabled son.  This year she lost her father and her following well.  Her mother is living in independent living center but they are not taking visitors due to Covid so that has been stressful for her.  Patient presents today with complaints of left-sided abdominal pain.  On December 5th she ate frozen store-bought shrimp and rice.  That evening she developed sharp, 8 out of 10, LUQ pain. Associated with burping.  No nausea or vomiting.  No change in bowel habits.  No fever.  No one else was sick.  Burping went on for several days.  Over a couple of days the pain lessened but still persistent although intermittent.  BMs every morning after Benefiber. Some pain around the navel as well. Much more bloating than usual.  Bloating gets worse the more she eats.  Starting to feeling better over the past one week.  Pain persist but intermittent and lesser severity.  Eating more prunes to make sure emptying well. Stools are softer. Few days after left-sided abdominal pain started, stool caliber little smaller.  Denies any n/v. No brbpr, melena. Drinking plenty of water.  No heartburn.  Rarely takes Relafen.  Does not take aspirin.   Current Outpatient Medications    Medication Sig Dispense Refill  . atorvastatin (LIPITOR) 10 MG tablet Take 0.5 tablets daily by mouth.  1  . Calcium-Phosphorus-Vitamin D (CITRACAL +D3 PO) Take 2 tablets by mouth 2 (two) times daily.    . cycloSPORINE (RESTASIS) 0.05 % ophthalmic emulsion Place 1 drop into both eyes 2 (two) times daily.    Marland Kitchen ELDERBERRY PO Take by mouth 2 (two) times daily. 2 gummies twice daily    . estradiol (ESTRACE VAGINAL) 0.1 MG/GM vaginal cream Place 1 Applicatorful 2 (two) times a week vaginally.     . Multiple Vitamins-Minerals (MULTIVITAMIN WITH MINERALS) tablet Take 1 tablet by mouth daily.    . nabumetone (RELAFEN) 500 MG tablet Take 500 mg by mouth 2 (two) times daily as needed for mild pain.     Marland Kitchen OVER THE COUNTER MEDICATION Relive powder 1 tbsp daily    . valACYclovir (VALTREX) 1000 MG tablet Take 500 mg by mouth 2 (two) times daily as needed (fever blisters).     . Wheat Dextrin (BENEFIBER) POWD Take by mouth daily. 2 tbsp     No current facility-administered medications for this visit.    Allergies as of 09/20/2019 - Review Complete 09/20/2019  Allergen Reaction Noted  . Amoxicillin  08/12/2017  . Bactrim [sulfamethoxazole-trimethoprim] Hives 08/12/2017  . Keflex [cephalexin] Hives 08/12/2017  . Hydrocodone Nausea Only 08/01/2018    ROS:  General: Negative for anorexia,  fever, chills, fatigue, weakness.  See HPI ENT: Negative for hoarseness, difficulty swallowing , nasal congestion. CV: Negative for chest pain, angina, palpitations, dyspnea on exertion, peripheral edema.  Respiratory: Negative for dyspnea at rest, dyspnea on exertion, cough, sputum, wheezing.  GI: See history of present illness. GU:  Negative for dysuria, hematuria, urinary incontinence, urinary frequency, nocturnal urination.  Endo: See HPI.    Physical Examination:   BP 135/84   Pulse (!) 104   Temp (!) 96.9 F (36.1 C) (Temporal)   Ht 5' 5.5" (1.664 m)   Wt 109 lb (49.4 kg)   BMI 17.86 kg/m    General: Well-nourished, well-developed in no acute distress.  Eyes: No icterus. Mouth: masked Lungs: Clear to auscultation bilaterally.  Heart: Regular rate and rhythm, no murmurs rubs or gallops.  Abdomen: Bowel sounds are hyperactive,  nondistended, no hepatosplenomegaly, no abdominal bruits or hernia , no rebound or guarding.  Mild tenderness noted in the left upper quadrant/epigastrium.  Discrete fullness in the epigastrium, possibly stomach but cannot exclude mass. Extremities: No lower extremity edema. No clubbing or deformities. Neuro: Alert and oriented x 4   Skin: Warm and dry, no jaundice.   Psych: Alert and cooperative, normal mood and affect.   Imaging Studies: No results found.   Impression/plan:  Pleasant 63 year old female presenting with 2 to 3-week history of acute onset left-sided abdominal pain associated with bloating and belching.  Initially left-sided abdominal pain in the upper abdomen, severe/sharp.  Pain has somewhat lessened, now intermittent, also extending into the left mid to lower abdomen.  On exam she has significant gas noted, fullness/mass like area in the epigastric region.  Currently symptoms have somewhat lessened but based on abdominal exam, greater than 10% unintentional weight loss, would recommend further work-up including CT abdomen pelvis with contrast.  Will also obtain labs for further evaluation of symptoms.  Patient requested postponing CT until after first of the year due to insurance reasons.  In the meantime she will avoid high FODMAP foods that may worsen bloating/gas.  Add probiotic or yogurt with live cultures daily for 4 weeks.

## 2019-09-20 ENCOUNTER — Ambulatory Visit: Payer: BC Managed Care – PPO | Admitting: Gastroenterology

## 2019-09-20 ENCOUNTER — Other Ambulatory Visit: Payer: Self-pay

## 2019-09-20 ENCOUNTER — Encounter: Payer: Self-pay | Admitting: Gastroenterology

## 2019-09-20 VITALS — BP 135/84 | HR 104 | Temp 96.9°F | Ht 65.5 in | Wt 109.0 lb

## 2019-09-20 DIAGNOSIS — R140 Abdominal distension (gaseous): Secondary | ICD-10-CM

## 2019-09-20 DIAGNOSIS — R1012 Left upper quadrant pain: Secondary | ICD-10-CM

## 2019-09-20 DIAGNOSIS — R109 Unspecified abdominal pain: Secondary | ICD-10-CM | POA: Diagnosis not present

## 2019-09-20 DIAGNOSIS — R142 Eructation: Secondary | ICD-10-CM | POA: Insufficient documentation

## 2019-09-20 DIAGNOSIS — R634 Abnormal weight loss: Secondary | ICD-10-CM | POA: Insufficient documentation

## 2019-09-20 MED ORDER — RESTORA PO CAPS: 1.0000 | ORAL_CAPSULE | Freq: Every day | ORAL | 0 refills | Status: DC

## 2019-09-20 NOTE — Patient Instructions (Signed)
1. Please have your labs done at least two days before your CT. 2. CT as scheduled.  3. Avoid high FODMAP foods as they will worsen your bloating/gas. 4. Either take a probiotic daily or eat yogurt with live cultures daily for next four weeks. Samples of Restora provided (take one daily).   Low-FODMAP Eating Plan  FODMAPs (fermentable oligosaccharides, disaccharides, monosaccharides, and polyols) are sugars that are hard for some people to digest. A low-FODMAP eating plan may help some people who have bowel (intestinal) diseases to manage their symptoms. This meal plan can be complicated to follow. Work with a diet and nutrition specialist (dietitian) to make a low-FODMAP eating plan that is right for you. A dietitian can make sure that you get enough nutrition from this diet. What are tips for following this plan? Reading food labels  Check labels for hidden FODMAPs such as: ? High-fructose syrup. ? Honey. ? Agave. ? Natural fruit flavors. ? Onion or garlic powder.  Choose low-FODMAP foods that contain 3-4 grams of fiber per serving.  Check food labels for serving sizes. Eat only one serving at a time to make sure FODMAP levels stay low. Meal planning  Follow a low-FODMAP eating plan for up to 6 weeks, or as told by your health care provider or dietitian.  To follow the eating plan: 1. Eliminate high-FODMAP foods from your diet completely. 2. Gradually reintroduce high-FODMAP foods into your diet one at a time. Most people should wait a few days after introducing one high-FODMAP food before they introduce the next high-FODMAP food. Your dietitian can recommend how quickly you may reintroduce foods. 3. Keep a daily record of what you eat and drink, and make note of any symptoms that you have after eating. 4. Review your daily record with a dietitian regularly. Your dietitian can help you identify which foods you can eat and which foods you should avoid. General tips  Drink enough  fluid each day to keep your urine pale yellow.  Avoid processed foods. These often have added sugar and may be high in FODMAPs.  Avoid most dairy products, whole grains, and sweeteners.  Work with a dietitian to make sure you get enough fiber in your diet. Recommended foods Grains  Gluten-free grains, such as rice, oats, buckwheat, quinoa, corn, polenta, and millet. Gluten-free pasta, bread, or cereal. Rice noodles. Corn tortillas. Vegetables  Eggplant, zucchini, cucumber, peppers, green beans, Brussels sprouts, bean sprouts, lettuce, arugula, kale, Swiss chard, spinach, collard greens, bok choy, summer squash, potato, and tomato. Limited amounts of corn, carrot, and sweet potato. Green parts of scallions. Fruits  Bananas, oranges, lemons, limes, blueberries, raspberries, strawberries, grapes, cantaloupe, honeydew melon, kiwi, papaya, passion fruit, and pineapple. Limited amounts of dried cranberries, banana chips, and shredded coconut. Dairy  Lactose-free milk, yogurt, and kefir. Lactose-free cottage cheese and ice cream. Non-dairy milks, such as almond, coconut, hemp, and rice milk. Yogurts made of non-dairy milks. Limited amounts of goat cheese, brie, mozzarella, parmesan, swiss, and other hard cheeses. Meats and other protein foods  Unseasoned beef, pork, poultry, or fish. Eggs. Berniece Salines. Tofu (firm) and tempeh. Limited amounts of nuts and seeds, such as almonds, walnuts, [place] nuts, pecans, peanuts, pumpkin seeds, chia seeds, and sunflower seeds. Fats and oils  Butter-free spreads. Vegetable oils, such as olive, canola, and sunflower oil. Seasoning and other foods  Artificial sweeteners with names that do not end in "ol" such as aspartame, saccharine, and stevia. Maple syrup, white table sugar, raw sugar, brown sugar, and molasses.  Fresh basil, coriander, parsley, rosemary, and thyme. Beverages  Water and mineral water. Sugar-sweetened soft drinks. Small amounts of orange juice or  cranberry juice. Black and green tea. Most dry wines. Coffee. This may not be a complete list of low-FODMAP foods. Talk with your dietitian for more information. Foods to avoid Grains  Wheat, including kamut, durum, and semolina. Barley and bulgur. Couscous. Wheat-based cereals. Wheat noodles, bread, crackers, and pastries. Vegetables  Chicory root, artichoke, asparagus, cabbage, snow peas, sugar snap peas, mushrooms, and cauliflower. Onions, garlic, leeks, and the white part of scallions. Fruits  Fresh, dried, and juiced forms of apple, pear, watermelon, peach, plum, cherries, apricots, blackberries, boysenberries, figs, nectarines, and mango. Avocado. Dairy  Milk, yogurt, ice cream, and soft cheese. Cream and sour cream. Milk-based sauces. Custard. Meats and other protein foods  Fried or fatty meat. Sausage. Cashews and pistachios. Soybeans, baked beans, black beans, chickpeas, kidney beans, fava beans, navy beans, lentils, and split peas. Seasoning and other foods  Any sugar-free gum or candy. Foods that contain artificial sweeteners such as sorbitol, mannitol, isomalt, or xylitol. Foods that contain honey, high-fructose corn syrup, or agave. Bouillon, vegetable stock, beef stock, and chicken stock. Garlic and onion powder. Condiments made with onion, such as hummus, chutney, pickles, relish, salad dressing, and salsa. Tomato paste. Beverages  Chicory-based drinks. Coffee substitutes. Chamomile tea. Fennel tea. Sweet or fortified wines such as port or sherry. Diet soft drinks made with isomalt, mannitol, maltitol, sorbitol, or xylitol. Apple, pear, and mango juice. Juices with high-fructose corn syrup. This may not be a complete list of high-FODMAP foods. Talk with your dietitian to discuss what dietary choices are best for you.  Summary  A low-FODMAP eating plan is a short-term diet that eliminates FODMAPs from your diet to help ease symptoms of certain bowel diseases.  The eating  plan usually lasts up to 6 weeks. After that, high-FODMAP foods are restarted gradually, one at a time, so you can find out which may be causing symptoms.  A low-FODMAP eating plan can be complicated. It is best to work with a dietitian who has experience with this type of plan. This information is not intended to replace advice given to you by your health care provider. Make sure you discuss any questions you have with your health care provider. Document Released: 05/11/2017 Document Revised: 08/27/2017 Document Reviewed: 05/11/2017 Elsevier Patient Education  2020 [employer].

## 2019-09-25 NOTE — Progress Notes (Signed)
Cc'ed to pcp °

## 2019-10-04 LAB — CBC WITH DIFFERENTIAL/PLATELET
Basophils Absolute: 0 10*3/uL (ref 0.0–0.2)
Basos: 1 %
EOS (ABSOLUTE): 0 10*3/uL (ref 0.0–0.4)
Eos: 0 %
Hematocrit: 39 % (ref 34.0–46.6)
Hemoglobin: 12.8 g/dL (ref 11.1–15.9)
Immature Grans (Abs): 0 10*3/uL (ref 0.0–0.1)
Immature Granulocytes: 0 %
Lymphocytes Absolute: 1.6 10*3/uL (ref 0.7–3.1)
Lymphs: 25 %
MCH: 30 pg (ref 26.6–33.0)
MCHC: 32.8 g/dL (ref 31.5–35.7)
MCV: 92 fL (ref 79–97)
Monocytes Absolute: 0.4 10*3/uL (ref 0.1–0.9)
Monocytes: 7 %
Neutrophils Absolute: 4.3 10*3/uL (ref 1.4–7.0)
Neutrophils: 67 %
Platelets: 273 10*3/uL (ref 150–450)
RBC: 4.26 x10E6/uL (ref 3.77–5.28)
RDW: 12.4 % (ref 11.7–15.4)
WBC: 6.4 10*3/uL (ref 3.4–10.8)

## 2019-10-04 LAB — COMPREHENSIVE METABOLIC PANEL
ALT: 14 IU/L (ref 0–32)
AST: 27 IU/L (ref 0–40)
Albumin/Globulin Ratio: 2.8 — ABNORMAL HIGH (ref 1.2–2.2)
Albumin: 4.8 g/dL (ref 3.8–4.8)
Alkaline Phosphatase: 67 IU/L (ref 39–117)
BUN/Creatinine Ratio: 11 — ABNORMAL LOW (ref 12–28)
BUN: 10 mg/dL (ref 8–27)
Bilirubin Total: 0.2 mg/dL (ref 0.0–1.2)
CO2: 28 mmol/L (ref 20–29)
Calcium: 9.6 mg/dL (ref 8.7–10.3)
Chloride: 102 mmol/L (ref 96–106)
Creatinine, Ser: 0.89 mg/dL (ref 0.57–1.00)
GFR calc Af Amer: 80 mL/min/{1.73_m2} (ref >59)
GFR calc non Af Amer: 69 mL/min/{1.73_m2} (ref >59)
Globulin, Total: 1.7 g/dL (ref 1.5–4.5)
Glucose: 109 mg/dL — ABNORMAL HIGH (ref 65–99)
Potassium: 4.5 mmol/L (ref 3.5–5.2)
Sodium: 141 mmol/L (ref 134–144)
Total Protein: 6.5 g/dL (ref 6.0–8.5)

## 2019-10-04 LAB — LIPASE: Lipase: 63 U/L (ref 14–72)

## 2019-10-09 ENCOUNTER — Telehealth: Payer: Self-pay

## 2019-10-09 NOTE — Telephone Encounter (Signed)
Tillie Rung at [facility]service center [phone], ext [phone]) called office and LMOVM. CT abd/pelvis scheduled for 10/11/19 needs PA.   PA for CT abd/pelvis w/contrast submitted via [employer]. Case approved. Order ID: [id number], valid 10/09/19-04/05/20.  LMOVM to inform Tillie Rung of [place].

## 2019-10-11 ENCOUNTER — Ambulatory Visit (HOSPITAL_COMMUNITY)
Admission: RE | Admit: 2019-10-11 | Discharge: 2019-10-11 | Disposition: A | Discharge status: Home / Self Care | Payer: BC Managed Care – PPO | Source: Ambulatory Visit | Attending: Gastroenterology | Admitting: Gastroenterology

## 2019-10-11 ENCOUNTER — Other Ambulatory Visit: Payer: Self-pay

## 2019-10-11 DIAGNOSIS — R1012 Left upper quadrant pain: Secondary | ICD-10-CM | POA: Diagnosis present

## 2019-10-11 MED ORDER — IOHEXOL 300 MG/ML  SOLN
100.0000 mL | Freq: Once | INTRAMUSCULAR | Status: AC | PRN
  Administered 2019-10-11: 100 mL via INTRAVENOUS

## 2019-10-16 ENCOUNTER — Encounter: Payer: Self-pay | Admitting: Internal Medicine

## 2019-10-16 ENCOUNTER — Telehealth: Payer: Self-pay | Admitting: Internal Medicine

## 2019-10-16 NOTE — Telephone Encounter (Signed)
Called pt, EGD w/RMR scheduled for 12/22/19 at 2:00pm. COVID test 12/20/19 at 2:00pm. Orders entered. Appt letter mailed with procedure instructions.

## 2019-10-16 NOTE — Telephone Encounter (Signed)
Pt was calling to see if LSL could go over her CT results. I told her LSL was with patients right now and I would put a message in for the nurse to call if results were available. [phone]

## 2019-10-16 NOTE — Telephone Encounter (Signed)
Called patient and discussed CT findings. It is not clear if all of her symptoms are related to narrowing of the left renal vein or reduced aortic-SMA angle.   She has had left sided abdominal pain which was acute onset on Dec 5th. Pain localized in LUQ. Associated with burping. Her pain has lessened with time but persistent. Also with pain in the epigastrium and increased bloated. Bloating had resolved for over a year until this episode in December. She added yogurt daily. Did not take Restora because concerned about omega fish oil in it. Years back Dad had hemorrhagic stroke. So decided to do the yogurt instead. BM daily but feels incomplete. Trying to follow the low FODMAP diet. Doesn't know what to eat. And not sure meals actually make symptoms worse or better. For bowels, eating a lot of fiber, prunes, Benefiber. Relafen prn. She will add miralax 17grams daily prn.  H/o microscopic hematuria in 2017/2018 and saw Dr. Jeffie Pollock. Patient reports cystoscopy was negative.   Has seen Vascular and vein specialist Dr. 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   PATIENT NEEDS EGD WITH RMR TO EVALUATE EPIG PAIN, BLOATING, BELCHING.   IF EGD UNREMARKABLE, WOULD CONSIDER FOLLOW UP WITH DR. 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

## 2019-10-16 NOTE — Telephone Encounter (Signed)
Pt is inquiring about her CT results. Pt saw results in mychart.

## 2019-10-19 ENCOUNTER — Telehealth: Payer: Self-pay | Admitting: Gastroenterology

## 2019-10-19 ENCOUNTER — Other Ambulatory Visit: Payer: Self-pay | Admitting: Gastroenterology

## 2019-10-19 DIAGNOSIS — R3129 Other microscopic hematuria: Secondary | ICD-10-CM

## 2019-10-19 DIAGNOSIS — R109 Unspecified abdominal pain: Secondary | ICD-10-CM

## 2019-10-19 MED ORDER — PANTOPRAZOLE SODIUM 40 MG PO TBEC: 40.0000 mg | DELAYED_RELEASE_TABLET | Freq: Every day | ORAL | 3 refills | Status: DC

## 2019-10-19 NOTE — Telephone Encounter (Signed)
Forwarding to [facility] clinical pool

## 2019-10-19 NOTE — Telephone Encounter (Signed)
New orders entered for EGD w/Propofol.

## 2019-10-19 NOTE — Telephone Encounter (Signed)
Called pt, EGD moved up to 11/02/19 at 12:15pm. Procedure will be with Propofol d/t new COVID restrictions at endo. LMOVM for endo scheduler to cancel previous orders.

## 2019-10-19 NOTE — Telephone Encounter (Signed)
Spoke with pt. Pt is aware that the referral will be made with Dr. Donzetta Matters and Dr. Donzetta Matters is expecting the referral. Pt will have u/a done at [facility] as directed.

## 2019-10-19 NOTE — Telephone Encounter (Signed)
Received mychart message from patient.  She would like to be put on a cancellation list for her EGD.  Definitely appropriate.  She is having postprandial upper abdominal discomfort.  I have sent in a prescription for pantoprazole.  She is aware via MyChart.  She also would like [place] to send a copy of her CT abdomen pelvis to Dr. Nadara Mustard.

## 2019-10-19 NOTE — Telephone Encounter (Signed)
Pre-op and COVID test 10/31/19. Letter mailed with procedure instructions.

## 2019-10-19 NOTE — Telephone Encounter (Signed)
Please make referral to Dr. 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   I have communicated CT findings with Dr. Donzetta Matters and he agrees with seeing patient for this finding. He would like to see patient after her EGD which is scheduled for 11/02/19.   He is also requesting U/A to be done prior to office visit. Orders placed.   Please let patient know.

## 2019-10-19 NOTE — Telephone Encounter (Signed)
Sent CT to Dr. Nadara Mustard

## 2019-10-19 NOTE — Telephone Encounter (Signed)
Noted  

## 2019-10-20 ENCOUNTER — Other Ambulatory Visit: Payer: Self-pay | Admitting: Gastroenterology

## 2019-10-20 MED ORDER — OMEPRAZOLE 20 MG PO CPDR: 20.0000 mg | DELAYED_RELEASE_CAPSULE | Freq: Every day | ORAL | 3 refills | Status: DC

## 2019-10-20 NOTE — Telephone Encounter (Signed)
Referral sent to Dr. Donzetta Matters via [place].

## 2019-10-20 NOTE — Addendum Note (Signed)
Addended by: Hassan Rowan on: 10/20/2019 07:51 AM   Modules accepted: Orders

## 2019-10-31 ENCOUNTER — Encounter (HOSPITAL_COMMUNITY)
Admission: RE | Admit: 2019-10-31 | Discharge: 2019-10-31 | Disposition: A | Discharge status: Home / Self Care | Payer: BC Managed Care – PPO | Source: Ambulatory Visit | Attending: Internal Medicine | Admitting: Internal Medicine

## 2019-10-31 ENCOUNTER — Encounter (HOSPITAL_COMMUNITY): Payer: Self-pay

## 2019-10-31 ENCOUNTER — Other Ambulatory Visit (HOSPITAL_COMMUNITY)
Admission: RE | Admit: 2019-10-31 | Discharge: 2019-10-31 | Disposition: A | Discharge status: Home / Self Care | Payer: BC Managed Care – PPO | Source: Ambulatory Visit | Attending: Internal Medicine | Admitting: Internal Medicine

## 2019-10-31 ENCOUNTER — Other Ambulatory Visit: Payer: Self-pay

## 2019-10-31 DIAGNOSIS — Z01812 Encounter for preprocedural laboratory examination: Secondary | ICD-10-CM | POA: Diagnosis present

## 2019-10-31 DIAGNOSIS — Z20822 Contact with and (suspected) exposure to covid-19: Secondary | ICD-10-CM | POA: Diagnosis not present

## 2019-10-31 HISTORY — DX: Gastro-esophageal reflux disease without esophagitis: K21.9

## 2019-10-31 LAB — SARS CORONAVIRUS 2 (TAT 6-24 HRS): SARS Coronavirus 2: NEGATIVE

## 2019-11-01 ENCOUNTER — Encounter: Payer: Self-pay | Admitting: Gastroenterology

## 2019-11-01 LAB — MICROSCOPIC EXAMINATION
Bacteria, UA: NONE SEEN
Casts: NONE SEEN /lpf
Epithelial Cells (non renal): NONE SEEN /hpf (ref 0–10)
RBC, Urine: NONE SEEN /hpf (ref 0–2)

## 2019-11-01 LAB — UA/M W/RFLX CULTURE, ROUTINE
Bilirubin, UA: NEGATIVE
Glucose, UA: NEGATIVE
Ketones, UA: NEGATIVE
Leukocytes,UA: NEGATIVE
Nitrite, UA: NEGATIVE
Protein,UA: NEGATIVE
RBC, UA: NEGATIVE
Specific Gravity, UA: 1.008 (ref 1.005–1.030)
Urobilinogen, Ur: 0.2 mg/dL (ref 0.2–1.0)
pH, UA: 7.5 (ref 5.0–7.5)

## 2019-11-02 ENCOUNTER — Encounter (HOSPITAL_COMMUNITY)
Admission: RE | Disposition: A | Discharge status: Home / Self Care | Payer: Self-pay | Source: Home / Self Care | Attending: Internal Medicine

## 2019-11-02 ENCOUNTER — Ambulatory Visit (HOSPITAL_COMMUNITY)
Admission: RE | Admit: 2019-11-02 | Discharge: 2019-11-02 | Disposition: A | Discharge status: Home / Self Care | Payer: BC Managed Care – PPO | Attending: Internal Medicine | Admitting: Internal Medicine

## 2019-11-02 ENCOUNTER — Ambulatory Visit (HOSPITAL_COMMUNITY): Payer: BC Managed Care – PPO | Admitting: Anesthesiology

## 2019-11-02 ENCOUNTER — Encounter (HOSPITAL_COMMUNITY): Payer: Self-pay | Admitting: Internal Medicine

## 2019-11-02 ENCOUNTER — Other Ambulatory Visit: Payer: Self-pay

## 2019-11-02 DIAGNOSIS — K219 Gastro-esophageal reflux disease without esophagitis: Secondary | ICD-10-CM | POA: Diagnosis not present

## 2019-11-02 DIAGNOSIS — K3189 Other diseases of stomach and duodenum: Secondary | ICD-10-CM

## 2019-11-02 DIAGNOSIS — Z85828 Personal history of other malignant neoplasm of skin: Secondary | ICD-10-CM | POA: Diagnosis not present

## 2019-11-02 DIAGNOSIS — M8580 Other specified disorders of bone density and structure, unspecified site: Secondary | ICD-10-CM | POA: Insufficient documentation

## 2019-11-02 DIAGNOSIS — Z79899 Other long term (current) drug therapy: Secondary | ICD-10-CM | POA: Insufficient documentation

## 2019-11-02 DIAGNOSIS — E785 Hyperlipidemia, unspecified: Secondary | ICD-10-CM | POA: Insufficient documentation

## 2019-11-02 DIAGNOSIS — R1013 Epigastric pain: Secondary | ICD-10-CM | POA: Insufficient documentation

## 2019-11-02 HISTORY — PX: BIOPSY: SHX5522

## 2019-11-02 HISTORY — PX: ESOPHAGOGASTRODUODENOSCOPY (EGD) WITH PROPOFOL: SHX5813

## 2019-11-02 SURGERY — ESOPHAGOGASTRODUODENOSCOPY (EGD) WITH PROPOFOL
Anesthesia: General

## 2019-11-02 MED ORDER — LACTATED RINGERS IV SOLN: INTRAVENOUS | Status: DC

## 2019-11-02 MED ORDER — LIDOCAINE HCL (CARDIAC) PF 100 MG/5ML IV SOSY
PREFILLED_SYRINGE | INTRAVENOUS | Status: DC | PRN
  Administered 2019-11-02: 20 mg via INTRATRACHEAL

## 2019-11-02 MED ORDER — PROPOFOL 10 MG/ML IV BOLUS
INTRAVENOUS | Status: DC | PRN
  Administered 2019-11-02: 20 mg via INTRAVENOUS

## 2019-11-02 MED ORDER — LACTATED RINGERS IV SOLN: INTRAVENOUS | Status: DC | PRN

## 2019-11-02 MED ORDER — PROPOFOL 500 MG/50ML IV EMUL
INTRAVENOUS | Status: DC | PRN
  Administered 2019-11-02: 125 ug/kg/min via INTRAVENOUS

## 2019-11-02 MED ORDER — KETAMINE HCL 10 MG/ML IJ SOLN
INTRAMUSCULAR | Status: DC | PRN
  Administered 2019-11-02: 20 mg via INTRAVENOUS

## 2019-11-02 MED ORDER — KETAMINE HCL 50 MG/5ML IJ SOSY
PREFILLED_SYRINGE | INTRAMUSCULAR | Status: AC
  Filled 2019-11-02: qty 5

## 2019-11-02 MED ORDER — MIDAZOLAM HCL 2 MG/2ML IJ SOLN: 0.5000 mg | Freq: Once | INTRAMUSCULAR | Status: DC | PRN

## 2019-11-02 MED ORDER — PROMETHAZINE HCL 25 MG/ML IJ SOLN: 6.2500 mg | INTRAMUSCULAR | Status: DC | PRN

## 2019-11-02 NOTE — Anesthesia Postprocedure Evaluation (Signed)
Anesthesia Post Note  Patient: [name]  Procedure(s) Performed: ESOPHAGOGASTRODUODENOSCOPY (EGD) WITH PROPOFOL (N/A ) BIOPSY  Patient location during evaluation: PACU Anesthesia Type: General Level of consciousness: awake and alert Pain management: pain level controlled Vital Signs Assessment: post-procedure vital signs reviewed and stable Respiratory status: spontaneous breathing Cardiovascular status: stable Postop Assessment: no apparent nausea or vomiting Anesthetic complications: no     Last Vitals:  Vitals:   11/02/19 1144  BP: 114/77  Pulse: 96  Resp: 20  SpO2: 99%    Last Pain:  Vitals:   11/02/19 1301  PainSc: 0-No pain                 Tryone Kille Hristova

## 2019-11-02 NOTE — Discharge Instructions (Signed)
EGD Discharge instructions Please read the instructions outlined below and refer to this sheet in the next few weeks. These discharge instructions provide you with general information on caring for yourself after you leave the hospital. Your doctor may also give you specific instructions. While your treatment has been planned according to the most current medical practices available, unavoidable complications occasionally occur. If you have any problems or questions after discharge, please call your doctor. ACTIVITY  You may resume your regular activity but move at a slower pace for the next 24 hours.   Take frequent rest periods for the next 24 hours.   Walking will help expel (get rid of) the air and reduce the bloated feeling in your abdomen.   No driving for 24 hours (because of the anesthesia (medicine) used during the test).   You may shower.   Do not sign any important legal documents or operate any machinery for 24 hours (because of the anesthesia used during the test).  NUTRITION  Drink plenty of fluids.   You may resume your normal diet.   Begin with a light meal and progress to your normal diet.   Avoid alcoholic beverages for 24 hours or as instructed by your caregiver.  MEDICATIONS  You may resume your normal medications unless your caregiver tells you otherwise.  WHAT YOU CAN EXPECT TODAY  You may experience abdominal discomfort such as a feeling of fullness or "gas" pains.  FOLLOW-UP  Your doctor will discuss the results of your test with you.  SEEK IMMEDIATE MEDICAL ATTENTION IF ANY OF THE FOLLOWING OCCUR:  Excessive nausea (feeling sick to your stomach) and/or vomiting.   Severe abdominal pain and distention (swelling).   Trouble swallowing.   Temperature over 101 F (37.8 C).   Rectal bleeding or vomiting of blood.    Stomach very irritated but no ulcer or tumor  Stop all forms of NSAIDs like ibuprofen and nabumetone --Tylenol/acetaminophen  okay  Stop omeprazole.  Begin Protonix 40 mg twice daily  Further recommendations to follow when biopsies return for review  Office visit with Neil Crouch in 6 weeks  At patient request, I called Lanny Hurst at [phone] and reviewed results    Monitored Anesthesia Care, Care After These instructions provide you with information about caring for yourself after your procedure. Your health care provider may also give you more specific instructions. Your treatment has been planned according to current medical practices, but problems sometimes occur. Call your health care provider if you have any problems or questions after your procedure. What can I expect after the procedure? After your procedure, you may:  Feel sleepy for several hours.  Feel clumsy and have poor balance for several hours.  Feel forgetful about what happened after the procedure.  Have poor judgment for several hours.  Feel nauseous or vomit.  Have a sore throat if you had a breathing tube during the procedure. Follow these instructions at home: For at least 24 hours after the procedure:      Have a responsible adult stay with you. It is important to have someone help care for you until you are awake and alert.  Rest as needed.  Do not: ? Participate in activities in which you could fall or become injured. ? Drive. ? Use heavy machinery. ? Drink alcohol. ? Take sleeping pills or medicines that cause drowsiness. ? Make important decisions or sign legal documents. ? Take care of children on your own. Eating and drinking  Follow the diet  that is recommended by your health care provider.  If you vomit, drink water, juice, or soup when you can drink without vomiting.  Make sure you have little or no nausea before eating solid foods. General instructions  Take over-the-counter and prescription medicines only as told by your health care provider.  If you have sleep apnea, surgery and certain medicines  can increase your risk for breathing problems. Follow instructions from your health care provider about wearing your sleep device: ? Anytime you are sleeping, including during daytime naps. ? While taking prescription pain medicines, sleeping medicines, or medicines that make you drowsy.  If you smoke, do not smoke without supervision.  Keep all follow-up visits as told by your health care provider. This is important. Contact a health care provider if:  You keep feeling nauseous or you keep vomiting.  You feel light-headed.  You develop a rash.  You have a fever. Get help right away if:  You have trouble breathing. Summary  For several hours after your procedure, you may feel sleepy and have poor judgment.  Have a responsible adult stay with you for at least 24 hours or until you are awake and alert. This information is not intended to replace advice given to you by your health care provider. Make sure you discuss any questions you have with your health care provider. Document Revised: 12/13/2017 Document Reviewed: 01/05/2016 Elsevier Patient Education  [place].

## 2019-11-02 NOTE — Transfer of Care (Signed)
Immediate Anesthesia Transfer of Care Note  Patient: [name]  Procedure(s) Performed: ESOPHAGOGASTRODUODENOSCOPY (EGD) WITH PROPOFOL (N/A ) BIOPSY  Patient Location: PACU  Anesthesia Type:General  Level of Consciousness: awake  Airway & Oxygen Therapy: Patient Spontanous Breathing  Post-op Assessment: Report given to RN and Post -op Vital signs reviewed and stable  Post vital signs: Reviewed and stable  Last Vitals:  Vitals Value Taken Time  BP 98/55 11/02/19 1318  Temp    Pulse 90 11/02/19 1320  Resp 16 11/02/19 1320  SpO2 98 % 11/02/19 1320  Vitals shown include unvalidated device data.  Last Pain:  Vitals:   11/02/19 1301  PainSc: 0-No pain      Patients Stated Pain Goal: 5 ([id number])  Complications: No apparent anesthesia complications

## 2019-11-02 NOTE — Op Note (Signed)
[facility] Patient Name: [name] Procedure Date: 11/02/2019 11:54 AM MRN: [id number] Date of Birth: 16-Apr-1956 Attending MD: Norvel Richards , MD CSN: [id number] Age: 64 Admit Type: Outpatient Procedure:                Upper GI endoscopy Indications:              Dyspepsia Providers:                Norvel Richards, MD, Jeanann Lewandowsky. Sharon Seller, RN,                            Aram Candela Referring MD:              Medicines:                Propofol per Anesthesia Complications:            No immediate complications. Estimated Blood Loss:     Estimated blood loss was minimal. Procedure:                Pre-Anesthesia Assessment:                           - Prior to the procedure, a History and Physical                            was performed, and patient medications and                            allergies were reviewed. The patient's tolerance of                            previous anesthesia was also reviewed. The risks                            and benefits of the procedure and the sedation                            options and risks were discussed with the patient.                            All questions were answered, and informed consent                            was obtained. Prior Anticoagulants: The patient has                            taken no previous anticoagulant or antiplatelet                            agents. ASA Grade Assessment: III - A patient with                            severe systemic disease. After reviewing the risks  and benefits, the patient was deemed in                            satisfactory condition to undergo the procedure.                           After obtaining informed consent, the endoscope was                            passed under direct vision. Throughout the                            procedure, the patient's blood pressure, pulse, and                            oxygen saturations were monitored  continuously. The                            [id number]) was introduced through the                            mouth, and advanced to the second part of duodenum.                            The upper GI endoscopy was accomplished without                            difficulty. The patient tolerated the procedure                            well. Scope In: 1:04:53 PM Scope Out: 1:12:40 PM Total Procedure Duration: 0 hours 7 minutes 47 seconds  Findings:      The examined esophagus was normal.      Gastric mucosa coated by thick tenacious bile-stained mucus. Stomach       diffusely injected. Scattered focal hemorrhagic erosions. No frank ulcer       or infiltrating process observed. Small hiatal hernia. Widely patent       pylorus. Abnormal appearing gastric mucosa was biopsied with a cold       forceps for histology. Estimated blood loss was minimal.      The duodenal bulb and second portion of the duodenum were normal. Impression:               - Normal esophagus.                           Bile-stained gastric mucosa. Diffuse hemorrhagic                            erosions?"status post biopsy                           - Normal duodenal bulb and second portion of the                            duodenum. Moderate  Sedation:      Moderate (conscious) sedation was personally administered by an       anesthesia professional. The following parameters were monitored: oxygen       saturation, heart rate, blood pressure, respiratory rate, EKG, adequacy       of pulmonary ventilation, and response to care. Recommendation:           - Patient has a contact number available for                            emergencies. The signs and symptoms of potential                            delayed complications were discussed with the                            patient. Return to normal activities tomorrow.                            Written discharge instructions were provided to the                             patient.                           - Advance diet as tolerated. Stop all forms of                            NSAIDs as discussed. Stop omeprazole. Begin                            Protonix 40 mg twice daily for the next 6 weeks.                           Follow-up on pathology.                           - Return to my office in 6 weeks. Procedure Code(s):        --- Professional ---                           [phone], Esophagogastroduodenoscopy, flexible,                            transoral; with biopsy, single or multiple Diagnosis Code(s):        --- Professional ---                           K31.89, Other diseases of stomach and duodenum                           R10.13, Epigastric pain CPT copyright 2019 American Medical Association. All rights reserved. The codes documented in this report are preliminary and upon coder review may  be revised to meet current compliance requirements. [name]. [name], MD Norvel Richards, MD 11/02/2019 1:26:09 PM This report has been signed  electronically. Number of Addenda: 0

## 2019-11-02 NOTE — H&P (Signed)
@LOGO @   Primary Care Physician:  Rory Percy, MD Primary Gastroenterologist:  Dr. Gala Romney  Pre-Procedure History & Physical: HPI:  [name] is a 64 y.o. female here for further evaluation of dyspepsia epigastric pain.  Recent NSAID use.  Symptoms improved with omeprazole 20 mg daily and relative cessation of NSAID use.  No dysphagia.  Past Medical History:  Diagnosis Date  . GERD (gastroesophageal reflux disease)   . Hyperlipidemia   . Osteopenia   . Squamous cell skin cancer    Right leg    Past Surgical History:  Procedure Laterality Date  . Biopsy right leg Right    skin  . COLONOSCOPY N/A 09/17/2017   Dr. Gala Romney: normal colonoscopy.  Next colonoscopy in 10 years.  . TONSILLECTOMY      Prior to Admission medications   Medication Sig Start Date End Date Taking? Authorizing Provider  acetaminophen (TYLENOL) 500 MG tablet Take 1,000 mg by mouth every 6 (six) hours as needed for moderate pain or headache.   Yes [provider]  atorvastatin (LIPITOR) 10 MG tablet Take 5 mg by mouth every evening.  07/28/17  Yes [provider]  Calcium-Phosphorus-Vitamin D (CITRACAL +D3 PO) Take 2 tablets by mouth 2 (two) times daily.   Yes [provider]  cycloSPORINE (RESTASIS) 0.05 % ophthalmic emulsion Place 1 drop into both eyes 2 (two) times daily.   Yes [provider]  ELDERBERRY PO Take 2 capsules by mouth 2 (two) times daily.    Yes [provider]  estradiol (ESTRACE VAGINAL) 0.1 MG/GM vaginal cream Place 1 Applicatorful 2 (two) times a week vaginally.    Yes [provider]  ibuprofen (ADVIL) 200 MG tablet Take 400 mg by mouth every 6 (six) hours as needed for headache or moderate pain.   Yes [provider]  omeprazole (PRILOSEC) 20 MG capsule Take 1 capsule (20 mg total) by mouth daily before breakfast. 10/20/19  Yes Mahala Menghini, PA-C  OVER THE COUNTER MEDICATION Take 1 Dose by mouth daily. Relive multivitamin   powder 1 tbsp daily   Yes [provider]  Wheat Dextrin (BENEFIBER) POWD Take 1 Dose by mouth daily. 2 tbsp    Yes [provider]  nabumetone (RELAFEN) 500 MG tablet Take 250 mg by mouth 2 (two) times daily as needed for mild pain.     [provider]  Probiotic Product (RESTORA) CAPS Take 1 capsule by mouth daily. Patient not taking: Reported on 10/20/2019 09/20/19   Mahala Menghini, PA-C  valACYclovir (VALTREX) 1000 MG tablet Take 2,000 mg by mouth 2 (two) times daily as needed (fever blisters).     [provider]    Allergies as of 10/19/2019 - Review Complete 09/20/2019  Allergen Reaction Noted  . Amoxicillin  08/12/2017  . Bactrim [sulfamethoxazole-trimethoprim] Hives 08/12/2017  . Keflex [cephalexin] Hives 08/12/2017  . Hydrocodone Nausea Only 08/01/2018    Family History  Problem Relation Age of Onset  . Colon cancer Cousin     Social History   Socioeconomic History  . Marital status: Married    Spouse name: Not on file  . Number of children: Not on file  . Years of education: Not on file  . Highest education level: Not on file  Occupational History  . Not on file  Tobacco Use  . Smoking status: Never Smoker  . Smokeless tobacco: Never Used  Substance and Sexual Activity  . Alcohol use: No  . Drug use: No  .  Sexual activity: Yes  Other Topics Concern  . Not on file  Social History Narrative  . Not on file   Social Determinants of Health   Financial Resource Strain:   . Difficulty of Paying Living Expenses: Not on file  Food Insecurity:   . Worried About Charity fundraiser in the Last Year: Not on file  . Ran Out of Food in the Last Year: Not on file  Transportation Needs:   . Lack of Transportation (Medical): Not on file  . Lack of Transportation (Non-Medical): Not on file  Physical Activity:   . Days of Exercise per Week: Not on file  . Minutes of Exercise per Session: Not on file  Stress:   . Feeling of Stress :  Not on file  Social Connections:   . Frequency of Communication with Friends and Family: Not on file  . Frequency of Social Gatherings with Friends and Family: Not on file  . Attends Religious Services: Not on file  . Active Member of Clubs or Organizations: Not on file  . Attends Archivist Meetings: Not on file  . Marital Status: Not on file  Intimate Partner Violence:   . Fear of Current or Ex-Partner: Not on file  . Emotionally Abused: Not on file  . Physically Abused: Not on file  . Sexually Abused: Not on file    Review of Systems: See HPI, otherwise negative ROS  Physical Exam: BP 114/77   Pulse 96   Resp 20   Ht 5' 5.5" (1.664 m)   Wt 47.6 kg   SpO2 99%   BMI 17.21 kg/m  General:   Alert,  Well-developed, well-nourished, pleasant and cooperative in NAD Mouth:  No deformity or lesions. Neck:  Supple; no masses or thyromegaly. No significant cervical adenopathy. Lungs:  Clear throughout to auscultation.   No wheezes, crackles, or rhonchi. No acute distress. Heart:  Regular rate and rhythm; no murmurs, clicks, rubs,  or gallops. Abdomen: Non-distended, normal bowel sounds.  Soft and nontender without appreciable mass or hepatosplenomegaly.  Pulses:  Normal pulses noted. Extremities:  Without clubbing or edema.  Impression/Plan: 64 year old lady with dyspepsia NSAID use.  Here for further evaluation via EGD.  No CT findings concerning for any evolving GI process.  No dysphagia.   Recommendations: I have offered the patient an diagnostic EGD today per plan.  The risks, benefits, limitations, alternatives and imponderables have been reviewed with the patient. Potential for esophageal dilation, biopsy, etc. have also been reviewed.  Questions have been answered. All parties agreeable.  Notice: This dictation was prepared with Dragon dictation along with smaller phrase technology. Any transcriptional errors that result from this process are unintentional and may not  be corrected upon review.

## 2019-11-02 NOTE — Anesthesia Preprocedure Evaluation (Signed)
Anesthesia Evaluation  Patient identified by MRN, date of birth, ID band Patient awake    Reviewed: Allergy & Precautions, NPO status , Patient's Chart, lab work & pertinent test results  Airway Mallampati: II  TM Distance: >3 FB Neck ROM: Full    Dental no notable dental hx. (+) Teeth Intact   Pulmonary neg pulmonary ROS,    Pulmonary exam normal breath sounds clear to auscultation       Cardiovascular Exercise Tolerance: Good negative cardio ROS Normal cardiovascular examI Rhythm:Regular Rate:Normal     Neuro/Psych negative neurological ROS  negative psych ROS   GI/Hepatic Neg liver ROS, GERD  Medicated and Controlled,  Endo/Other  negative endocrine ROS  Renal/GU negative Renal ROSRenal V. 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 2 moh's, one Sq Cell Ca- never had chemo   Reproductive/Obstetrics negative OB ROS                             Anesthesia Physical Anesthesia Plan  ASA: II  Anesthesia Plan: General   Post-op Pain Management:    Induction: Intravenous  PONV Risk Score and Plan: 3 and Propofol infusion, TIVA, Ondansetron and Treatment may vary due to age or medical condition  Airway Management Planned: Simple Face Mask and Nasal Cannula  Additional Equipment:   Intra-op Plan:   Post-operative Plan:   Informed Consent: I have reviewed the patients History and Physical, chart, labs and discussed the procedure including the risks, benefits and alternatives for the proposed anesthesia with the patient or authorized representative who has indicated his/her understanding and acceptance.     Dental advisory given  Plan Discussed with:  CRNA  Anesthesia Plan Comments: (Plan Full PPE use  Plan GA with GETA as needed d/w pt -WTP with same after Q&A)        Anesthesia Quick Evaluation

## 2019-11-03 ENCOUNTER — Encounter: Payer: Self-pay | Admitting: Vascular Surgery

## 2019-11-03 ENCOUNTER — Ambulatory Visit (INDEPENDENT_AMBULATORY_CARE_PROVIDER_SITE_OTHER): Payer: BC Managed Care – PPO | Admitting: Vascular Surgery

## 2019-11-03 ENCOUNTER — Encounter: Payer: Self-pay | Admitting: Gastroenterology

## 2019-11-03 ENCOUNTER — Other Ambulatory Visit: Payer: Self-pay

## 2019-11-03 VITALS — BP 99/67 | HR 89 | Temp 98.4°F | Resp 20 | Ht 65.5 in | Wt 104.7 lb

## 2019-11-03 DIAGNOSIS — I871 Compression of vein: Secondary | ICD-10-CM | POA: Diagnosis not present

## 2019-11-03 LAB — SURGICAL PATHOLOGY

## 2019-11-03 NOTE — Progress Notes (Signed)
Patient ID: [name], female   DOB: Mar 15, 1956, 64 y.o.   MRN: [id number]  Reason for Consult: Follow-up   Referred by Rory Percy, MD  Subjective:     HPI:  VERDELLA STOEN is a 64 y.o. female whom I have previously seen for right foot swelling with squamous cell carcinoma and was being seen at the wound care center with Dr. Dellia Nims.  She has actually been wearing compression stocking although she is not wearing them today and states that her swelling is much improved.  Today she is here for what has been an ongoing left upper quadrant abdominal pain.  She states this began quite suddenly and early December and it happened after she had lifted some glass.  She states the pain was gnawing and initially moderate to severe although appears to be mild to moderate at this time.  She is undergone EGD yesterday which did have some abnormalities and she is being treated medically for these as she had no ulceration.  The pain in her left upper quadrant has been associated with belching and generalized abdominal discomfort.  She does not note any changes in her bowels.  Patient has lost significant weight in fact 8 pounds in the past 11 months by our notes.  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  Urinalysis was sent prior to evaluation.  Past Medical History:  Diagnosis Date  . GERD (gastroesophageal reflux disease)   . Hyperlipidemia   . Osteopenia   . Squamous cell skin cancer    Right leg   Family History  Problem Relation Age of Onset  . Colon cancer Cousin    Past Surgical History:  Procedure Laterality Date  . Biopsy right leg Right    skin  . COLONOSCOPY N/A 09/17/2017   Dr. Gala Romney: normal colonoscopy.  Next colonoscopy in 10 years.  . TONSILLECTOMY      Short Social History:  Social History   Tobacco Use  . Smoking status: Never Smoker  . Smokeless tobacco: Never Used  Substance Use Topics  . Alcohol use:  No    Allergies  Allergen Reactions  . Amoxicillin     Severe fatigue  Did it involve swelling of the face/tongue/throat, SOB, or low BP? No Did it involve sudden or severe rash/hives, skin peeling, or any reaction on the inside of your mouth or nose? No Did you need to seek medical attention at a hospital or doctor's office? No When did it last happen?6 - 7 years If all above answers are "NO", may proceed with cephalosporin use.   . Bactrim [Sulfamethoxazole-Trimethoprim] Hives  . Keflex [Cephalexin] Hives  . Hydrocodone Nausea Only    Current Outpatient Medications  Medication Sig Dispense Refill  . acetaminophen (TYLENOL) 500 MG tablet Take 1,000 mg by mouth every 6 (six) hours as needed for moderate pain or headache.    Marland Kitchen atorvastatin (LIPITOR) 10 MG tablet Take 5 mg by mouth every evening.   1  . Calcium-Phosphorus-Vitamin D (CITRACAL +D3 PO) Take 2 tablets by mouth 2 (two) times daily.    . cycloSPORINE (RESTASIS) 0.05 % ophthalmic emulsion Place 1 drop into both eyes 2 (two) times daily.    Marland Kitchen ELDERBERRY PO Take 2 capsules by mouth 2 (two) times daily.     Marland Kitchen estradiol (ESTRACE VAGINAL) 0.1 MG/GM vaginal cream Place 1 Applicatorful 2 (two) times a week vaginally.     Marland Kitchen OVER  THE COUNTER MEDICATION Take 1 Dose by mouth daily. Relive multivitamin  powder 1 tbsp daily    . pantoprazole (PROTONIX) 40 MG tablet     . valACYclovir (VALTREX) 1000 MG tablet Take 2,000 mg by mouth 2 (two) times daily as needed (fever blisters).     . Wheat Dextrin (BENEFIBER) POWD Take 1 Dose by mouth daily. 2 tbsp      No current facility-administered medications for this visit.    Review of Systems  Constitutional:  Constitutional negative. HENT: HENT negative.  Eyes: Eyes negative.  Respiratory: Respiratory negative.  Cardiovascular: Positive for leg swelling.  GI: Positive for abdominal pain.  GU:       Hematuria in the past Musculoskeletal: Musculoskeletal negative.  Skin: Skin  negative.  Neurological: Neurological negative. Hematologic: Hematologic/lymphatic negative.  Psychiatric: Psychiatric negative.        Objective:  Objective   Vitals:   11/03/19 1041  BP: 99/67  Pulse: 89  Resp: 20  Temp: 98.4 F (36.9 C)  SpO2: 100%  Weight: 104 lb 11.2 oz (47.5 kg)  Height: 5' 5.5" (1.664 m)   Body mass index is 17.16 kg/m.  Physical Exam HENT:     Head: Normocephalic.     Nose:     Comments: Mask in place Eyes:     Pupils: Pupils are equal, round, and reactive to light.  Cardiovascular:     Rate and Rhythm: Normal rate.  Pulmonary:     Effort: Pulmonary effort is normal.  Abdominal:     Palpations: Abdomen is soft. There is no mass.  Musculoskeletal:        General: No swelling. Normal range of motion.     Cervical back: Normal range of motion and neck supple.  Skin:    General: Skin is warm and dry.     Capillary Refill: Capillary refill takes less than 2 seconds.  Neurological:     General: No focal deficit present.     Mental Status: She is alert.  Psychiatric:        Mood and Affect: Mood normal.        Thought Content: Thought content normal.        Judgment: Judgment normal.     Data: I have reviewed her CT scan with her which demonstrates compression of the renal vein at the level at the level of the SMA.     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  Symptoms do not appear to be related to the anatomic abnormality given lack of hematuria and pain that is likely related to gastrointestinal issues.  We did evaluate her CTs together today discussed the types of repair being most likely open less likely endovascular.  At this time I do not think she needs any intervention and she can follow-up on an as-needed basis.      Waynetta Sandy MD [facility]

## 2019-11-07 ENCOUNTER — Encounter: Payer: Self-pay | Admitting: Internal Medicine

## 2019-11-09 ENCOUNTER — Other Ambulatory Visit: Payer: Self-pay | Admitting: Gastroenterology

## 2019-11-09 ENCOUNTER — Encounter: Payer: Self-pay | Admitting: Gastroenterology

## 2019-11-09 MED ORDER — OMEPRAZOLE 20 MG PO CPDR: 20.0000 mg | DELAYED_RELEASE_CAPSULE | Freq: Two times a day (BID) | ORAL | 3 refills | Status: DC

## 2019-11-24 ENCOUNTER — Encounter: Payer: Self-pay | Admitting: Gastroenterology

## 2019-11-27 ENCOUNTER — Telehealth: Payer: Self-pay | Admitting: Gastroenterology

## 2019-11-27 ENCOUNTER — Other Ambulatory Visit: Payer: Self-pay

## 2019-11-27 DIAGNOSIS — R109 Unspecified abdominal pain: Secondary | ICD-10-CM

## 2019-11-27 DIAGNOSIS — R7309 Other abnormal glucose: Secondary | ICD-10-CM

## 2019-11-27 DIAGNOSIS — R770 Abnormality of albumin: Secondary | ICD-10-CM

## 2019-11-27 DIAGNOSIS — R634 Abnormal weight loss: Secondary | ICD-10-CM

## 2019-11-27 NOTE — Telephone Encounter (Signed)
Noted. Spoke with pt. Pt is going to have her labs done this week. Lab orders placed.

## 2019-11-27 NOTE — Telephone Encounter (Signed)
See patient mychart message.   Let's arrange for her to have labs done in near future. She is planning on having vit D level done by her orthopedist.   Dx: abnormal weight loss, elevated glucose, abdominal pain, elevated albumin/globulin ratio.   Need following labs: HgbA1C, TSH with free T4, LFTs

## 2019-11-29 LAB — HEPATIC FUNCTION PANEL
ALT: 14 IU/L (ref 0–32)
AST: 27 IU/L (ref 0–40)
Albumin: 4.8 g/dL (ref 3.8–4.8)
Alkaline Phosphatase: 77 IU/L (ref 39–117)
Bilirubin Total: 0.3 mg/dL (ref 0.0–1.2)
Bilirubin, Direct: 0.11 mg/dL (ref 0.00–0.40)
Total Protein: 6.6 g/dL (ref 6.0–8.5)

## 2019-11-29 LAB — TSH+FREE T4
Free T4: 1.28 ng/dL (ref 0.82–1.77)
TSH: 1.57 u[IU]/mL (ref 0.450–4.500)

## 2019-11-29 LAB — HEMOGLOBIN A1C
Est. average glucose Bld gHb Est-mCnc: 117 mg/dL
Hgb A1c MFr Bld: 5.7 % — ABNORMAL HIGH (ref 4.8–5.6)

## 2019-11-30 ENCOUNTER — Encounter: Payer: Self-pay | Admitting: Gastroenterology

## 2019-11-30 ENCOUNTER — Telehealth: Payer: Self-pay | Admitting: Gastroenterology

## 2019-11-30 DIAGNOSIS — R770 Abnormality of albumin: Secondary | ICD-10-CM

## 2019-11-30 NOTE — Telephone Encounter (Signed)
Referral sent 

## 2019-11-30 NOTE — Addendum Note (Signed)
Addended by: Cheron Every on: 11/30/2019 03:43 PM   Modules accepted: Orders

## 2019-11-30 NOTE — Telephone Encounter (Signed)
Patient need hematology referral for high albumin/globulin ratio, ?low immunoglobulins/FH of immunodeficiency.   Patient aware, see mychart message.

## 2019-12-06 ENCOUNTER — Encounter: Payer: Self-pay | Admitting: Gastroenterology

## 2019-12-06 ENCOUNTER — Ambulatory Visit: Payer: BC Managed Care – PPO | Admitting: Gastroenterology

## 2019-12-06 ENCOUNTER — Other Ambulatory Visit: Payer: Self-pay

## 2019-12-06 VITALS — BP 129/68 | HR 83 | Temp 96.9°F | Ht 66.0 in | Wt 105.8 lb

## 2019-12-06 DIAGNOSIS — K2970 Gastritis, unspecified, without bleeding: Secondary | ICD-10-CM

## 2019-12-06 DIAGNOSIS — R634 Abnormal weight loss: Secondary | ICD-10-CM | POA: Diagnosis not present

## 2019-12-06 DIAGNOSIS — R109 Unspecified abdominal pain: Secondary | ICD-10-CM | POA: Diagnosis not present

## 2019-12-06 DIAGNOSIS — K2990 Gastroduodenitis, unspecified, without bleeding: Secondary | ICD-10-CM

## 2019-12-06 DIAGNOSIS — R140 Abdominal distension (gaseous): Secondary | ICD-10-CM | POA: Diagnosis not present

## 2019-12-06 NOTE — Patient Instructions (Signed)
1. Please keep a food diary including calories for the next 2 weeks so that we can track if there are any foods contributing to your bloating/pain and to help the dietician determine if you are eating adequate amount of calories daily. 2. Chest xray, we will contact you with results as available. 3. Keep upcoming hematology appointment 4. Further recommendations to follow once we receive your chest xray results and I have discussed your case with Dr. Gala Romney.

## 2019-12-06 NOTE — Progress Notes (Signed)
Primary Care Physician: Rory Percy, MD  Primary Gastroenterologist:  Garfield Cornea, MD   Chief Complaint  Patient presents with  . Follow-up    pp f/u    HPI: [name] is a 64 y.o. female here for follow-up.  Longstanding history of abdominal bloating and gas. Was seen back in December with complaints of burping, left-sided abdominal pain, weight loss.  She had noted rather acute onset abdominal pain after eating frozen store-bought shrimp and rice in December.  Developed left upper quadrant abdominal pain with burping.  Increased bloating.  Due to significant unintentional weight loss she underwent CT abdomen pelvis as outlined below.  We advised on FODMAP diet.  Added back probiotics. She is on benefiber chronically.  EGD November 02, 2019: Gastric mucosa coated by thick tenacious bile-stained mucus.  Stomach diffusely injected.  Scattered focal hemorrhagic erosions.  No frank ulcer.  Small hiatal hernia.  Duodenum appeared normal.  Gastric biopsy showed nonspecific reactive gastropathy, no H. Pylori.    Labs in January: Normal CBC, LFTs normal except for albumin/globulin ratio elevated at 2.8.  Glucose slightly elevated 109, nonfasting.  Creatinine normal.  Lipase normal.  Recent labs with A1c slightly elevated at 5.7, albumin/globulin ratio improved but slightly high at 2.67, LFTs normal, TSH and free T4 normal.  Previously screened for celiac disease through serologies, negative.  CT abdomen pelvis with contrast October 11, 2019: Prominent stool throughout the colon.  Reduced aortic-SMA angle at about 20 degrees with some narrowing of the left renal vein in this vicinity, nutcracker syndrome cannot be excluded.  No findings of SMA syndrome.  Patient was referred back to her vascular surgeon, Dr. Servando Snare, for evaluation for possible nutcracker syndrome.  Urinalysis prior to the visit per his request was unremarkable with no evidence of microscopic hematuria.  He did not  feel her symptoms were related to the anatomic abnormality given lack of hematuria and he felt pain was more related to gastrointestinal issues.  Recommended follow-up with him as needed for any additional concerns.  We started on omeprazole towards the end of January.  At first she felt like it was helping quite a bit.  After endoscopy findings we increased her to twice daily. Hematology referral pending for high Albumin/globulin ratio, questionable low immunoglobulins/family history of immunodeficiency.She has been advised to eliminate any extra medications like vitamins, herbal medications. She was provided gastritis/GERD diet handout.   Weight down 4 pounds since 08/2019. Down 15 pounds in the past two years.   Today she presents for follow up. She is very concerned about her overall weight loss. States it seems to go back to after her surgery in the latter part of 2019 for skin cancer. She reports postprandial bloating/distention. Abdomin feels firm after meals. She is finding it hard to determine what to eat as she is having a lot of dyspepsia with foods. She is adding protein supplement to her foods to increase her protein and calorie intake. She is trying to eat smaller portions 5-6 times per day. Complains of early satiety. Bloating worse as the day progresses. She is having BM daily. No melena, brbpr. Still having LUQ discomfort first thing in the mornings. Less frequent and milder. Continues Benefiber/prunes every morning for bowel function. Avoiding dairy.        Current Outpatient Medications  Medication Sig Dispense Refill  . acetaminophen (TYLENOL) 500 MG tablet Take 1,000 mg by mouth every 6 (six) hours as needed for moderate pain  or headache.    Marland Kitchen atorvastatin (LIPITOR) 10 MG tablet Take 5 mg by mouth every evening.   1  . Calcium-Phosphorus-Vitamin D (CITRACAL +D3 PO) Take 2 tablets by mouth 2 (two) times daily.    . cycloSPORINE (RESTASIS) 0.05 % ophthalmic emulsion Place 1 drop  into both eyes 2 (two) times daily.    Marland Kitchen ELDERBERRY PO Take 2 capsules by mouth daily.     Marland Kitchen estradiol (ESTRACE VAGINAL) 0.1 MG/GM vaginal cream Place 1 Applicatorful 2 (two) times a week vaginally.     Marland Kitchen omeprazole (PRILOSEC) 20 MG capsule Take 1 capsule (20 mg total) by mouth 2 (two) times daily before a meal. 60 capsule 3  . valACYclovir (VALTREX) 1000 MG tablet Take 2,000 mg by mouth 2 (two) times daily as needed (fever blisters).     . Wheat Dextrin (BENEFIBER) POWD Take 1 Dose by mouth daily. 2 tbsp      No current facility-administered medications for this visit.    Allergies as of 12/06/2019 - Review Complete 12/06/2019  Allergen Reaction Noted  . Amoxicillin  08/12/2017  . Bactrim [sulfamethoxazole-trimethoprim] Hives 08/12/2017  . Keflex [cephalexin] Hives 08/12/2017  . Hydrocodone Nausea Only 08/01/2018    ROS:  General: Negative for  fever, chills, fatigue, weakness. See hpi ENT: Negative for hoarseness, difficulty swallowing , nasal congestion. CV: Negative for chest pain, angina, palpitations, dyspnea on exertion, peripheral edema.  Respiratory: Negative for dyspnea at rest, dyspnea on exertion, cough, sputum, wheezing.  GI: See history of present illness. GU:  Negative for dysuria, hematuria, urinary incontinence, urinary frequency, nocturnal urination.  Endo: see hpi    Physical Examination:   BP 129/68   Pulse 83   Temp (!) 96.9 F (36.1 C) (Temporal)   Ht 5\' 6"  (1.676 m)   Wt 105 lb 12.8 oz (48 kg)   BMI 17.08 kg/m   General: very pleasant thin female in no acute distress.  Eyes: No icterus. Mouth: masked Lungs: Clear to auscultation bilaterally.  Heart: Regular rate and rhythm, no murmurs rubs or gallops.  Abdomen: Bowel sounds are normal, nontender, nondistended, no hepatosplenomegaly or masses, no abdominal bruits or hernia , no rebound or guarding.   Extremities: No lower extremity edema. No clubbing or deformities. Neuro: Alert and oriented x 4     Skin: Warm and dry, no jaundice.   Psych: Alert and cooperative, normal mood and affect.  Labs:  Lab Results  Component Value Date   CREATININE 0.89 10/03/2019   BUN 10 10/03/2019   NA 141 10/03/2019   K 4.5 10/03/2019   CL 102 10/03/2019   CO2 28 10/03/2019   Lab Results  Component Value Date   ALT 14 11/28/2019   AST 27 11/28/2019   ALKPHOS 77 11/28/2019   BILITOT 0.3 11/28/2019   Lab Results  Component Value Date   WBC 6.4 10/03/2019   HGB 12.8 10/03/2019   HCT 39.0 10/03/2019   MCV 92 10/03/2019   PLT 273 10/03/2019   Lab Results  Component Value Date   TSH 1.570 11/28/2019   Lab Results  Component Value Date   HGBA1C 5.7 (H) 11/28/2019   Lab Results  Component Value Date   LIPASE 63 10/03/2019    Imaging Studies: No results found.

## 2019-12-07 ENCOUNTER — Telehealth: Payer: Self-pay | Admitting: Gastroenterology

## 2019-12-07 ENCOUNTER — Encounter: Payer: Self-pay | Admitting: Gastroenterology

## 2019-12-07 NOTE — Telephone Encounter (Signed)
No appointment is needed. She can walk in. Mychart message sent to patient.

## 2019-12-07 NOTE — Assessment & Plan Note (Signed)
Very pleasant 64 year old female with several month history of recurrent abdominal bloating left upper quadrant pain, diminished appetite/early satiety, unintentional weight loss presenting for follow-up.  Extensive evaluation as outlined.  Diagnosed with gastritis 4 weeks ago, PPI increased to twice daily, patient with persisting symptoms.  She does feel like her left upper quadrant pain has improved since onset of symptoms.  She finds it hard to determine what to eat due to postprandial bloating.  She is very concerned about unintentional weight loss.  Reports that x2 years.  Very difficult to explain all of her symptoms EGD findings.  Discussed with patient, we will check a chest x-ray, weight loss.  She will start a food journal documenting what she eats, calories, any particular GI symptoms.  She will drop it off for my review after a few weeks.  We will make arrangements for her to see a dietitian to discuss caloric needs to make sure she is consuming adequate calories as well as she can eat her regarding borderline diabetes.  Await hematology referral as already scheduled.  After chest x-ray, will discuss further management with Dr. Gala Romney.

## 2019-12-07 NOTE — Telephone Encounter (Signed)
Please schedule chest xray 2 view for unintentional weight loss. I have placed order.

## 2019-12-08 ENCOUNTER — Encounter: Payer: Self-pay | Admitting: Gastroenterology

## 2019-12-08 ENCOUNTER — Ambulatory Visit (HOSPITAL_COMMUNITY)
Admission: RE | Admit: 2019-12-08 | Discharge: 2019-12-08 | Disposition: A | Discharge status: Home / Self Care | Payer: BC Managed Care – PPO | Source: Ambulatory Visit | Attending: Gastroenterology | Admitting: Gastroenterology

## 2019-12-08 ENCOUNTER — Other Ambulatory Visit: Payer: Self-pay

## 2019-12-08 DIAGNOSIS — R109 Unspecified abdominal pain: Secondary | ICD-10-CM | POA: Insufficient documentation

## 2019-12-08 DIAGNOSIS — R634 Abnormal weight loss: Secondary | ICD-10-CM | POA: Diagnosis not present

## 2019-12-08 DIAGNOSIS — K2990 Gastroduodenitis, unspecified, without bleeding: Secondary | ICD-10-CM | POA: Insufficient documentation

## 2019-12-08 DIAGNOSIS — R140 Abdominal distension (gaseous): Secondary | ICD-10-CM | POA: Diagnosis present

## 2019-12-08 DIAGNOSIS — K2970 Gastritis, unspecified, without bleeding: Secondary | ICD-10-CM | POA: Insufficient documentation

## 2019-12-11 ENCOUNTER — Ambulatory Visit: Payer: BC Managed Care – PPO | Admitting: Gastroenterology

## 2019-12-17 ENCOUNTER — Telehealth: Payer: Self-pay | Admitting: Gastroenterology

## 2019-12-17 DIAGNOSIS — R1012 Left upper quadrant pain: Secondary | ICD-10-CM

## 2019-12-17 NOTE — Telephone Encounter (Signed)
Patient needs referral to [facility] for dx: LUQ pain, weight loss. She says her son sees Dr. Nancie Neas at [place] and if appropriate she would like to see him.

## 2019-12-18 ENCOUNTER — Encounter (HOSPITAL_COMMUNITY): Payer: Self-pay

## 2019-12-18 ENCOUNTER — Other Ambulatory Visit: Payer: Self-pay

## 2019-12-18 NOTE — Telephone Encounter (Signed)
Referral sent 

## 2019-12-18 NOTE — Addendum Note (Signed)
Addended by: Cheron Every on: 12/18/2019 08:07 AM   Modules accepted: Orders

## 2019-12-19 ENCOUNTER — Inpatient Hospital Stay (HOSPITAL_COMMUNITY): Payer: BC Managed Care – PPO | Attending: Hematology | Admitting: Hematology

## 2019-12-19 ENCOUNTER — Inpatient Hospital Stay (HOSPITAL_COMMUNITY): Payer: BC Managed Care – PPO

## 2019-12-19 ENCOUNTER — Encounter (HOSPITAL_COMMUNITY): Payer: Self-pay | Admitting: Hematology

## 2019-12-19 VITALS — BP 132/80 | HR 95 | Temp 98.0°F | Resp 18 | Wt 107.0 lb

## 2019-12-19 DIAGNOSIS — D801 Nonfamilial hypogammaglobulinemia: Secondary | ICD-10-CM | POA: Insufficient documentation

## 2019-12-19 DIAGNOSIS — R634 Abnormal weight loss: Secondary | ICD-10-CM | POA: Diagnosis not present

## 2019-12-19 DIAGNOSIS — D839 Common variable immunodeficiency, unspecified: Secondary | ICD-10-CM

## 2019-12-19 DIAGNOSIS — K219 Gastro-esophageal reflux disease without esophagitis: Secondary | ICD-10-CM | POA: Insufficient documentation

## 2019-12-19 DIAGNOSIS — Z806 Family history of leukemia: Secondary | ICD-10-CM | POA: Diagnosis not present

## 2019-12-19 DIAGNOSIS — Z800 Family history of malignant neoplasm of digestive organs: Secondary | ICD-10-CM | POA: Insufficient documentation

## 2019-12-19 DIAGNOSIS — Z8262 Family history of osteoporosis: Secondary | ICD-10-CM | POA: Insufficient documentation

## 2019-12-19 DIAGNOSIS — Z8249 Family history of ischemic heart disease and other diseases of the circulatory system: Secondary | ICD-10-CM | POA: Diagnosis not present

## 2019-12-19 DIAGNOSIS — Z79899 Other long term (current) drug therapy: Secondary | ICD-10-CM | POA: Insufficient documentation

## 2019-12-19 DIAGNOSIS — R768 Other specified abnormal immunological findings in serum: Secondary | ICD-10-CM

## 2019-12-19 DIAGNOSIS — E785 Hyperlipidemia, unspecified: Secondary | ICD-10-CM | POA: Diagnosis not present

## 2019-12-19 DIAGNOSIS — R7689 Other specified abnormal immunological findings in serum: Secondary | ICD-10-CM

## 2019-12-19 DIAGNOSIS — M8580 Other specified disorders of bone density and structure, unspecified site: Secondary | ICD-10-CM | POA: Diagnosis not present

## 2019-12-19 DIAGNOSIS — R1012 Left upper quadrant pain: Secondary | ICD-10-CM | POA: Insufficient documentation

## 2019-12-19 DIAGNOSIS — Z85828 Personal history of other malignant neoplasm of skin: Secondary | ICD-10-CM | POA: Diagnosis not present

## 2019-12-19 LAB — COMPREHENSIVE METABOLIC PANEL
ALT: 26 U/L (ref 0–44)
AST: 32 U/L (ref 15–41)
Albumin: 4.6 g/dL (ref 3.5–5.0)
Alkaline Phosphatase: 77 U/L (ref 38–126)
Anion gap: 8 (ref 5–15)
BUN: 22 mg/dL (ref 8–23)
CO2: 30 mmol/L (ref 22–32)
Calcium: 9.1 mg/dL (ref 8.9–10.3)
Chloride: 100 mmol/L (ref 98–111)
Creatinine, Ser: 0.69 mg/dL (ref 0.44–1.00)
GFR calc Af Amer: >60 mL/min (ref >60)
GFR calc non Af Amer: >60 mL/min (ref >60)
Glucose, Bld: 99 mg/dL (ref 70–99)
Potassium: 3.7 mmol/L (ref 3.5–5.1)
Sodium: 138 mmol/L (ref 135–145)
Total Bilirubin: 0.4 mg/dL (ref 0.3–1.2)
Total Protein: 7 g/dL (ref 6.5–8.1)

## 2019-12-19 LAB — CBC WITH DIFFERENTIAL/PLATELET
Abs Immature Granulocytes: 0.02 10*3/uL (ref 0.00–0.07)
Basophils Absolute: 0.1 10*3/uL (ref 0.0–0.1)
Basophils Relative: 1 %
Eosinophils Absolute: 0.1 10*3/uL (ref 0.0–0.5)
Eosinophils Relative: 1 %
HCT: 40.1 % (ref 36.0–46.0)
Hemoglobin: 12.9 g/dL (ref 12.0–15.0)
Immature Granulocytes: 0 %
Lymphocytes Relative: 21 %
Lymphs Abs: 1.3 10*3/uL (ref 0.7–4.0)
MCH: 30.3 pg (ref 26.0–34.0)
MCHC: 32.2 g/dL (ref 30.0–36.0)
MCV: 94.1 fL (ref 80.0–100.0)
Monocytes Absolute: 0.5 10*3/uL (ref 0.1–1.0)
Monocytes Relative: 7 %
Neutro Abs: 4.6 10*3/uL (ref 1.7–7.7)
Neutrophils Relative %: 70 %
Platelets: 302 10*3/uL (ref 150–400)
RBC: 4.26 MIL/uL (ref 3.87–5.11)
RDW: 13.6 % (ref 11.5–15.5)
WBC: 6.5 10*3/uL (ref 4.0–10.5)
nRBC: 0 % (ref 0.0–0.2)

## 2019-12-19 LAB — VITAMIN B12: Vitamin B-12: 479 pg/mL (ref 180–914)

## 2019-12-19 LAB — IRON AND TIBC
Iron: 49 ug/dL (ref 28–170)
Saturation Ratios: 14 % (ref 10.4–31.8)
TIBC: 361 ug/dL (ref 250–450)
UIBC: 312 ug/dL

## 2019-12-19 LAB — FERRITIN: Ferritin: 89 ng/mL (ref 11–307)

## 2019-12-19 LAB — VITAMIN D 25 HYDROXY (VIT D DEFICIENCY, FRACTURES): Vit D, 25-Hydroxy: 68.31 ng/mL (ref 30–100)

## 2019-12-19 LAB — FOLATE: Folate: 25.4 ng/mL (ref >5.9)

## 2019-12-19 LAB — LACTATE DEHYDROGENASE: LDH: 147 U/L (ref 98–192)

## 2019-12-19 NOTE — Assessment & Plan Note (Addendum)
1.  Hypogammaglobulinemia: -She was evaluated for low IgA levels of 72. -She also had increased albumin to globulin ratio of 2.8 on recent labs. -Denies any recurrent infections or hospitalizations.  Denies any sinopulmonary infections. -Also reported weight loss of 10 to 12 pounds in the last 2 years and a 4 pounds since December 2020. -We will check her quantitative immunoglobulin levels today along with her routine labs.  2.  Left upper quadrant abdominal pain: -She reported on and off for left upper quadrant abdominal pain for the last couple of years. -On aggregate she lost 12 pounds in the last 2 years. -CT abdomen and pelvis on 10/11/2019 reviewed by me showed prominent stool in the colon with reduced aortic-SMA angle with some narrowing of the left renal vein questionable for nutcracker syndrome.  No findings of SMA syndrome. -On examination today she has tenderness in the left upper quadrant below the rib cage.  No masses palpable. -Because of her significant weight loss, I have recommended CT scan of the chest to complete work-up.

## 2019-12-19 NOTE — Progress Notes (Signed)
CONSULT NOTE  Patient Care Team: Rory Percy, MD as PCP - General (Family Medicine) Gala Romney, [name], MD as Consulting Physician (Gastroenterology)  CHIEF COMPLAINTS/PURPOSE OF CONSULTATION: Low Immunoglobulins  HISTORY OF PRESENTING ILLNESS:  [name] 64 y.o. female was referred by gastro for low immunoglobulins.  Patient reports she has had multiple problems over the past 2 years.  She reports she has lost 15 pounds over the past 2 years.  She also reports having left upper quadrant pain on and off for 2 years.  She reports the pain increases with any wheat flour or dairy.  She denies any NSAID ingestion.  She denies any chronic infections needing antibiotics.  She reports her appetite has been decreased and she has early satiety.  She does report increased weakness in her hands.  Over the past year she has had 4 squamous cell carcinomas removed on her right lower leg.  One in particular was nonhealing for months where she had multiple visits with the wound clinic.  It is completely healed at this point.  She denies any recent chest pain on exertion, shortness of breath on minimal exertion, presyncopal episodes or palpitations.  She has not noticed any recent bleeding such as epistasis, hematuria or hematochezia.  She reports she had 2 colonoscopies over the past 10 years.  Her age-appropriate screenings are up-to-date.  She has a positive family history of a mother with large granulosis leukemia.  She has a maternal uncle with lung cancer.   MEDICAL HISTORY:  Past Medical History:  Diagnosis Date  . GERD (gastroesophageal reflux disease)   . Hyperlipidemia   . Osteopenia   . Osteoporosis   . Squamous cell skin cancer    Right leg    SURGICAL HISTORY: Past Surgical History:  Procedure Laterality Date  . BIOPSY  11/02/2019   Procedure: BIOPSY;  Surgeon: Daneil Dolin, MD;  [facility]: [facility];  Service: Endoscopy;;  gastric  . Biopsy right leg Right    skin  . COLONOSCOPY  N/A 09/17/2017   Dr. Gala Romney: normal colonoscopy.  Next colonoscopy in 10 years.  . ESOPHAGOGASTRODUODENOSCOPY (EGD) WITH PROPOFOL N/A 11/02/2019   Dr. Gala Romney: Gastric mucosa coated by thick tenacious bile-stained mucus.  Stomach diffusely injected with scattered focal hemorrhagic erosions but no frank ulcer.  Small hiatal hernia.  Gastric biopsy showed nonspecific reactive gastropathy, no H. pylori.  Duodenum appeared normal.  . TONSILLECTOMY      SOCIAL HISTORY: Social History   Socioeconomic History  . Marital status: Married    Spouse name: Not on file  . Number of children: 1  . Years of education: Not on file  . Highest education level: Not on file  Occupational History  . Not on file  Tobacco Use  . Smoking status: Never Smoker  . Smokeless tobacco: Never Used  Substance and Sexual Activity  . Alcohol use: No  . Drug use: No  . Sexual activity: Yes  Other Topics Concern  . Not on file  Social History Narrative  . Not on file   Social Determinants of Health   Financial Resource Strain:   . Difficulty of Paying Living Expenses:   Food Insecurity:   . Worried About Charity fundraiser in the Last Year:   . Arboriculturist in the Last Year:   Transportation Needs:   . Film/video editor (Medical):   Marland Kitchen Lack of Transportation (Non-Medical):   Physical Activity:   . Days of Exercise  per Week:   . Minutes of Exercise per Session:   Stress:   . Feeling of Stress :   Social Connections:   . Frequency of Communication with Friends and Family:   . Frequency of Social Gatherings with Friends and Family:   . Attends Religious Services:   . Active Member of Clubs or Organizations:   . Attends Archivist Meetings:   Marland Kitchen Marital Status:   Intimate Partner Violence:   . Fear of Current or Ex-Partner:   . Emotionally Abused:   Marland Kitchen Physically Abused:   . Sexually Abused:     FAMILY HISTORY: Family History  Problem Relation Age of Onset  . Osteoporosis Mother   .  Kidney disease Mother   . Autoimmune disease Mother   . Leukemia Mother   . Parkinson's disease Father   . Dementia Father   . Kidney disease Father   . Stroke Father   . Angina Father   . Skin cancer Father   . Colon cancer Cousin   . Parkinson's disease Maternal Grandfather   . Heart attack Paternal Grandfather   . Cerebral palsy Son     ALLERGIES:  is allergic to amoxicillin; bactrim [sulfamethoxazole-trimethoprim]; keflex [cephalexin]; and hydrocodone.  MEDICATIONS:  Current Outpatient Medications  Medication Sig Dispense Refill  . acetaminophen (TYLENOL) 500 MG tablet Take 1,000 mg by mouth every 6 (six) hours as needed for moderate pain or headache.    Marland Kitchen atorvastatin (LIPITOR) 10 MG tablet Take 5 mg by mouth every evening.   1  . Calcium-Phosphorus-Vitamin D (CITRACAL +D3 PO) Take 2 tablets by mouth 2 (two) times daily.    . cycloSPORINE (RESTASIS) 0.05 % ophthalmic emulsion Place 1 drop into both eyes 2 (two) times daily.    Marland Kitchen ELDERBERRY PO Take 2 capsules by mouth daily.     Marland Kitchen estradiol (ESTRACE VAGINAL) 0.1 MG/GM vaginal cream Place 1 Applicatorful 2 (two) times a week vaginally.     Marland Kitchen omeprazole (PRILOSEC) 20 MG capsule Take 1 capsule (20 mg total) by mouth 2 (two) times daily before a meal. 60 capsule 3  . valACYclovir (VALTREX) 1000 MG tablet Take 2,000 mg by mouth 2 (two) times daily as needed (fever blisters).     . Wheat Dextrin (BENEFIBER) POWD Take 1 Dose by mouth daily. 2 tbsp      No current facility-administered medications for this visit.    REVIEW OF SYSTEMS:   Constitutional: Denies fevers, chills or abnormal night sweats Respiratory: Denies cough, dyspnea or wheezes Cardiovascular: Denies palpitation, chest discomfort or lower extremity swelling Gastrointestinal:  Denies nausea, heartburn or change in bowel habits Skin: Denies abnormal skin rashes Lymphatics: Denies new lymphadenopathy or easy bruising Neurological:Denies numbness, tingling or new  weaknesses Behavioral/Psych: Mood is stable, no new changes  All other systems were reviewed with the patient and are negative.  PHYSICAL EXAMINATION: ECOG PERFORMANCE STATUS: 1 - Symptomatic but completely ambulatory  Vitals:   12/19/19 1334  BP: 132/80  Pulse: 95  Resp: 18  Temp: 98 F (36.7 C)  SpO2: 100%   Filed Weights   12/19/19 1334  Weight: 107 lb (48.5 kg)    GENERAL:alert, no distress and comfortable SKIN: skin color, texture, turgor are normal, no rashes or significant lesions NECK: supple, thyroid normal size, non-tender, without nodularity LYMPH:  no palpable lymphadenopathy in the cervical, axillary or inguinal LUNGS: clear to auscultation and percussion with normal breathing effort HEART: regular rate & rhythm and no murmurs and no lower  extremity edema ABDOMEN:abdomen soft, non-tender and normal bowel sounds.  Left upper quadrant tenderness present on deep palpation. Musculoskeletal:no cyanosis of digits and no clubbing  PSYCH: alert & oriented x 3 with fluent speech NEURO: no focal motor/sensory deficits  LABORATORY DATA:  I have reviewed the data as listed Recent Results (from the past 2160 hour(s))  CBC with Differential     Status: None   Collection Time: 10/03/19  1:45 PM  Result Value Ref Range   WBC 6.4 3.4 - 10.8 x10E3/uL   RBC 4.26 3.77 - 5.28 x10E6/uL   Hemoglobin 12.8 11.1 - 15.9 g/dL   Hematocrit 39.0 34.0 - 46.6 %   MCV 92 79 - 97 fL   MCH 30.0 26.6 - 33.0 pg   MCHC 32.8 31.5 - 35.7 g/dL   RDW 12.4 11.7 - 15.4 %   Platelets 273 150 - 450 x10E3/uL   Neutrophils 67 Not Estab. %   Lymphs 25 Not Estab. %   Monocytes 7 Not Estab. %   Eos 0 Not Estab. %   Basos 1 Not Estab. %   Neutrophils Absolute 4.3 1.4 - 7.0 x10E3/uL   Lymphocytes Absolute 1.6 0.7 - 3.1 x10E3/uL   Monocytes Absolute 0.4 0.1 - 0.9 x10E3/uL   EOS (ABSOLUTE) 0.0 0.0 - 0.4 x10E3/uL   Basophils Absolute 0.0 0.0 - 0.2 x10E3/uL   Immature Granulocytes 0 Not Estab. %    Immature Grans (Abs) 0.0 0.0 - 0.1 x10E3/uL  Comprehensive metabolic panel     Status: Abnormal   Collection Time: 10/03/19  1:45 PM  Result Value Ref Range   Glucose 109 (H) 65 - 99 mg/dL   BUN 10 8 - 27 mg/dL   Creatinine, Ser 0.89 0.57 - 1.00 mg/dL   GFR calc non Af Amer 69 >59 mL/min/1.73   GFR calc Af Amer 80 >59 mL/min/1.73   BUN/Creatinine Ratio 11 (L) 12 - 28   Sodium 141 134 - 144 mmol/L   Potassium 4.5 3.5 - 5.2 mmol/L   Chloride 102 96 - 106 mmol/L   CO2 28 20 - 29 mmol/L   Calcium 9.6 8.7 - 10.3 mg/dL   Total Protein 6.5 6.0 - 8.5 g/dL   Albumin 4.8 3.8 - 4.8 g/dL   Globulin, Total 1.7 1.5 - 4.5 g/dL   Albumin/Globulin Ratio 2.8 (H) 1.2 - 2.2   Bilirubin Total 0.2 0.0 - 1.2 mg/dL   Alkaline Phosphatase 67 39 - 117 IU/L   AST 27 0 - 40 IU/L   ALT 14 0 - 32 IU/L  Lipase     Status: None   Collection Time: 10/03/19  1:45 PM  Result Value Ref Range   Lipase 63 14 - 72 U/L  SARS CORONAVIRUS 2 (TAT 6-24 HRS) Nasopharyngeal Nasopharyngeal Swab     Status: None   Collection Time: 10/31/19  7:05 AM   Specimen: Nasopharyngeal Swab  Result Value Ref Range   SARS Coronavirus 2 NEGATIVE NEGATIVE    Comment: (NOTE) SARS-CoV-2 target nucleic acids are NOT DETECTED. The SARS-CoV-2 RNA is generally detectable in upper and lower respiratory specimens during the acute phase of infection. Negative results do not preclude SARS-CoV-2 infection, do not rule out co-infections with other pathogens, and should not be used as the sole basis for treatment or other patient management decisions. Negative results must be combined with clinical observations, patient history, and epidemiological information. The expected result is Negative. Fact Sheet for Patients: SugarRoll.be Fact Sheet for Healthcare Providers: https://www.woods-mathews.com/ This test  is not yet approved or cleared by the [place] and  has been authorized for detection  and/or diagnosis of SARS-CoV-2 by FDA under an Emergency Use Authorization (EUA). This EUA will remain  in effect (meaning this test can be used) for the duration of the COVID-19 declaration under Section 56 4(b)(1) of the Act, 21 U.S.C. section 360bbb-3(b)(1), unless the authorization is terminated or revoked sooner. Performed at [facility], [place] [address], [place], [place] [address]   UA/M w/rflx Culture, Routine     Status: None   Collection Time: 10/31/19 11:57 AM   Specimen: Urine  Result Value Ref Range   Specific Gravity, UA 1.008 1.005 - 1.030   pH, UA 7.5 5.0 - 7.5   Color, UA Yellow Yellow   Appearance Ur Clear Clear   Leukocytes,UA Negative Negative   Protein,UA Negative Negative/Trace   Glucose, UA Negative Negative   Ketones, UA Negative Negative   RBC, UA Negative Negative   Bilirubin, UA Negative Negative   Urobilinogen, Ur 0.2 0.2 - 1.0 mg/dL   Nitrite, UA Negative Negative   Microscopic Examination Comment     Comment: Microscopic follows if indicated.   Microscopic Examination See below:     Comment: Microscopic was indicated and was performed.   Urinalysis Reflex Comment     Comment: This specimen will not reflex to a Urine Culture.  Microscopic Examination     Status: None   Collection Time: 10/31/19 11:57 AM  Result Value Ref Range   WBC, UA 0-5 0 - 5 /hpf   RBC None seen 0 - 2 /hpf   Epithelial Cells (non renal) None seen 0 - 10 /hpf   Casts None seen None seen /lpf   Mucus, UA Present Not Estab.   Bacteria, UA None seen None seen/Few  Surgical pathology     Status: None   Collection Time: 11/02/19  1:10 PM  Result Value Ref Range   SURGICAL PATHOLOGY      SURGICAL PATHOLOGY CASE: [id number] PATIENT: Jasmine Parfait Surgical Pathology Report     Clinical History: epigastric pain, bloating, belching     FINAL MICROSCOPIC DIAGNOSIS:  A. STOMACH, BIOPSY: - Gastric antral and oxyntic mucosa with mild nonspecific  reactive gastropathy - Warthin Starry stain is negative for Helicobacter pylori     GROSS DESCRIPTION:  Received in formalin are tan, soft tissue fragments that are submitted in toto. Number: 2 size: 0.2 and 0.4 cm blocks: 1 Craig Staggers 11/03/2019)    Final Diagnosis performed by Jaquita Folds, MD.   Electronically signed 11/03/2019 Technical component performed at [facility] [place] [address] [place], [place] [address].  Professional component performed at [employer]. [facility], [place] [address] [place], [place] [address].  Immunohistochemistry Technical component (if applicable) was performed at [facility]. [address] [place], [place], [place] [address].    IMMUNOHISTOCHEMISTRY DISCLAIMER (if applicable): Some of these immunohistochemical stains may have been developed and the performance characteristics determine by [facility]. Some may not have been cleared or approved by the U.S. Food and Drug Administration. The FDA has determined that such clearance or approval is not necessary. This test is used for clinical purposes. It should not be regarded as investigational or for research. This laboratory is certified under the [place] (CLIA-88) as qualified to perform high complexity clinical laboratory testing.  The controls stained appropriately.   HgB A1c     Status:  Abnormal   Collection Time: 11/28/19  2:05 PM  Result Value Ref Range   Hgb A1c MFr Bld 5.7 (H) 4.8 - 5.6 %    Comment:          Prediabetes: 5.7 - 6.4          Diabetes: >6.4          Glycemic control for adults with diabetes: <7.0    Est. average glucose Bld gHb Est-mCnc 117 mg/dL  TSH + free T4     Status: None   Collection Time: 11/28/19  2:07 PM  Result Value Ref Range   TSH 1.570 0.450 - 4.500 uIU/mL   Free T4 1.28 0.82 - 1.77 ng/dL  Hepatic function panel     Status: None   Collection Time: 11/28/19   2:08 PM  Result Value Ref Range   Total Protein 6.6 6.0 - 8.5 g/dL   Albumin 4.8 3.8 - 4.8 g/dL   Bilirubin Total 0.3 0.0 - 1.2 mg/dL   Bilirubin, Direct 0.11 0.00 - 0.40 mg/dL   Alkaline Phosphatase 77 39 - 117 IU/L   AST 27 0 - 40 IU/L   ALT 14 0 - 32 IU/L  Lactate dehydrogenase     Status: None   Collection Time: 12/19/19  3:29 PM  Result Value Ref Range   LDH 147 98 - 192 U/L    Comment: Performed at [facility], [address] [place], [place] [address]  CBC with Differential/Platelet     Status: None   Collection Time: 12/19/19  3:29 PM  Result Value Ref Range   WBC 6.5 4.0 - 10.5 K/uL   RBC 4.26 3.87 - 5.11 MIL/uL   Hemoglobin 12.9 12.0 - 15.0 g/dL   HCT 40.1 36.0 - 46.0 %   MCV 94.1 80.0 - 100.0 fL   MCH 30.3 26.0 - 34.0 pg   MCHC 32.2 30.0 - 36.0 g/dL   RDW 13.6 11.5 - 15.5 %   Platelets 302 150 - 400 K/uL   nRBC 0.0 0.0 - 0.2 %   Neutrophils Relative % 70 %   Neutro Abs 4.6 1.7 - 7.7 K/uL   Lymphocytes Relative 21 %   Lymphs Abs 1.3 0.7 - 4.0 K/uL   Monocytes Relative 7 %   Monocytes Absolute 0.5 0.1 - 1.0 K/uL   Eosinophils Relative 1 %   Eosinophils Absolute 0.1 0.0 - 0.5 K/uL   Basophils Relative 1 %   Basophils Absolute 0.1 0.0 - 0.1 K/uL   Immature Granulocytes 0 %   Abs Immature Granulocytes 0.02 0.00 - 0.07 K/uL    Comment: Performed at [facility], [address] [place], [place] [address]  Comprehensive metabolic panel     Status: None   Collection Time: 12/19/19  3:29 PM  Result Value Ref Range   Sodium 138 135 - 145 mmol/L   Potassium 3.7 3.5 - 5.1 mmol/L   Chloride 100 98 - 111 mmol/L   CO2 30 22 - 32 mmol/L   Glucose, Bld 99 70 - 99 mg/dL    Comment: Glucose reference range applies only to samples taken after fasting for at least 8 hours.   BUN 22 8 - 23 mg/dL   Creatinine, Ser 0.69 0.44 - 1.00 mg/dL   Calcium 9.1 8.9 - 10.3 mg/dL   Total Protein 7.0 6.5 - 8.1 g/dL   Albumin 4.6 3.5 - 5.0 g/dL   AST 32 15 - 41 U/L   ALT 26 0 -  44 U/L    Alkaline Phosphatase 77 38 - 126 U/L   Total Bilirubin 0.4 0.3 - 1.2 mg/dL   GFR calc non Af Amer >60 >60 mL/min   GFR calc Af Amer >60 >60 mL/min   Anion gap 8 5 - 15    Comment: Performed at [facility], [address] [place], [place] [address]  Ferritin     Status: None   Collection Time: 12/19/19  3:29 PM  Result Value Ref Range   Ferritin 89 11 - 307 ng/mL    Comment: Performed at [facility], [address] [place], [place] [address]  Iron and TIBC     Status: None   Collection Time: 12/19/19  3:29 PM  Result Value Ref Range   Iron 49 28 - 170 ug/dL   TIBC 361 250 - 450 ug/dL   Saturation Ratios 14 10.4 - 31.8 %   UIBC 312 ug/dL    Comment: Performed at [facility], [address] [place], [place] [address]  Vitamin B12     Status: None   Collection Time: 12/19/19  3:29 PM  Result Value Ref Range   Vitamin B-12 479 180 - 914 pg/mL    Comment: (NOTE) This assay is not validated for testing neonatal or myeloproliferative syndrome specimens for Vitamin B12 levels. Performed at [facility], [address] [place], [place] [address]     RADIOGRAPHIC STUDIES: I have personally reviewed the radiological images as listed and agreed with the findings in the report. DG Chest 2 View  Result Date: 12/08/2019 CLINICAL DATA:  Unexplained weight loss of 10 lb in past year. EXAM: CHEST - 2 VIEW COMPARISON:  None. FINDINGS: The heart size and mediastinal contours are within normal limits. Both lungs are clear. The visualized skeletal structures are unremarkable. IMPRESSION: No active cardiopulmonary disease. Electronically Signed   By: Marlaine Hind M.D.   On: 12/08/2019 16:42    ASSESSMENT & PLAN:  Low serum IgA for age 39.  Hypogammaglobulinemia: -She was evaluated for low IgA levels of 72. -She also had increased albumin to globulin ratio of 2.8 on recent labs. -Denies any recurrent infections or hospitalizations.  Denies any sinopulmonary infections. -Also reported weight loss  of 10 to 12 pounds in the last 2 years and a 4 pounds since December 2020. -We will check her quantitative immunoglobulin levels today along with her routine labs.  2.  Left upper quadrant abdominal pain: -She reported on and off for left upper quadrant abdominal pain for the last couple of years. -On aggregate she lost 12 pounds in the last 2 years. -CT abdomen and pelvis on 10/11/2019 reviewed by me showed prominent stool in the colon with reduced aortic-SMA angle with some narrowing of the left renal vein questionable for nutcracker syndrome.  No findings of SMA syndrome. -On examination today she has tenderness in the left upper quadrant below the rib cage.  No masses palpable. -Because of her significant weight loss, I have recommended CT scan of the chest to complete work-up.     All questions were answered. The patient knows to call the clinic with any problems, questions or concerns.      Derek Jack, MD 12/19/19 6:27 PM

## 2019-12-20 ENCOUNTER — Other Ambulatory Visit (HOSPITAL_COMMUNITY): Payer: BC Managed Care – PPO

## 2019-12-20 LAB — ANTINUCLEAR ANTIBODIES, IFA: ANA Ab, IFA: NEGATIVE

## 2019-12-20 LAB — HAPTOGLOBIN: Haptoglobin: 118 mg/dL (ref 37–355)

## 2019-12-20 LAB — IGG, IGA, IGM
IgA: 76 mg/dL — ABNORMAL LOW (ref 87–352)
IgG (Immunoglobin G), Serum: 607 mg/dL (ref 586–1602)
IgM (Immunoglobulin M), Srm: 84 mg/dL (ref 26–217)

## 2019-12-20 LAB — RHEUMATOID FACTOR: Rheumatoid fact SerPl-aCnc: <10 IU/mL (ref 0.0–13.9)

## 2019-12-21 ENCOUNTER — Encounter (HOSPITAL_COMMUNITY): Payer: Self-pay | Admitting: Nurse Practitioner

## 2019-12-22 ENCOUNTER — Ambulatory Visit (HOSPITAL_COMMUNITY): Admit: 2019-12-22 | Payer: BC Managed Care – PPO | Admitting: Internal Medicine

## 2019-12-22 ENCOUNTER — Encounter (HOSPITAL_COMMUNITY): Payer: Self-pay

## 2019-12-22 SURGERY — EGD (ESOPHAGOGASTRODUODENOSCOPY)
Anesthesia: Moderate Sedation

## 2020-01-04 ENCOUNTER — Other Ambulatory Visit: Payer: Self-pay

## 2020-01-04 ENCOUNTER — Ambulatory Visit (HOSPITAL_COMMUNITY)
Admission: RE | Admit: 2020-01-04 | Discharge: 2020-01-04 | Disposition: A | Discharge status: Home / Self Care | Payer: BC Managed Care – PPO | Source: Ambulatory Visit | Attending: Nurse Practitioner | Admitting: Nurse Practitioner

## 2020-01-04 DIAGNOSIS — D839 Common variable immunodeficiency, unspecified: Secondary | ICD-10-CM | POA: Diagnosis present

## 2020-01-04 MED ORDER — IOHEXOL 300 MG/ML  SOLN
75.0000 mL | Freq: Once | INTRAMUSCULAR | Status: AC | PRN
  Administered 2020-01-04: 14:00:00 75 mL via INTRAVENOUS

## 2020-01-05 ENCOUNTER — Telehealth (HOSPITAL_COMMUNITY): Payer: Self-pay | Admitting: *Deleted

## 2020-01-05 ENCOUNTER — Encounter: Payer: Self-pay | Admitting: Gastroenterology

## 2020-01-05 NOTE — Telephone Encounter (Signed)
Ct scan results from [facility] radiology called into clinic. Results printed and given to Francene Finders, NP.

## 2020-01-08 ENCOUNTER — Encounter (HOSPITAL_COMMUNITY): Payer: Self-pay | Admitting: Nurse Practitioner

## 2020-01-09 ENCOUNTER — Other Ambulatory Visit (HOSPITAL_COMMUNITY): Payer: Self-pay | Admitting: Nurse Practitioner

## 2020-01-09 LAB — PANCREATIC ELASTASE, FECAL: Pancreatic Elastase, Fecal: 440 ug Elast./g (ref >200)

## 2020-01-16 ENCOUNTER — Encounter (HOSPITAL_COMMUNITY): Payer: Self-pay | Admitting: Hematology

## 2020-01-16 ENCOUNTER — Other Ambulatory Visit: Payer: Self-pay

## 2020-01-16 ENCOUNTER — Inpatient Hospital Stay (HOSPITAL_COMMUNITY): Payer: BC Managed Care – PPO | Attending: Hematology | Admitting: Hematology

## 2020-01-16 VITALS — BP 141/81 | HR 95 | Temp 98.2°F | Resp 18 | Wt 109.2 lb

## 2020-01-16 DIAGNOSIS — R911 Solitary pulmonary nodule: Secondary | ICD-10-CM | POA: Diagnosis not present

## 2020-01-16 DIAGNOSIS — Z79899 Other long term (current) drug therapy: Secondary | ICD-10-CM | POA: Diagnosis not present

## 2020-01-16 DIAGNOSIS — R1012 Left upper quadrant pain: Secondary | ICD-10-CM | POA: Insufficient documentation

## 2020-01-16 DIAGNOSIS — M818 Other osteoporosis without current pathological fracture: Secondary | ICD-10-CM | POA: Diagnosis not present

## 2020-01-16 DIAGNOSIS — D802 Selective deficiency of immunoglobulin A [IgA]: Secondary | ICD-10-CM | POA: Diagnosis not present

## 2020-01-16 DIAGNOSIS — R768 Other specified abnormal immunological findings in serum: Secondary | ICD-10-CM

## 2020-01-16 DIAGNOSIS — R634 Abnormal weight loss: Secondary | ICD-10-CM | POA: Insufficient documentation

## 2020-01-16 DIAGNOSIS — D839 Common variable immunodeficiency, unspecified: Secondary | ICD-10-CM | POA: Diagnosis not present

## 2020-01-16 DIAGNOSIS — Z85828 Personal history of other malignant neoplasm of skin: Secondary | ICD-10-CM | POA: Diagnosis not present

## 2020-01-16 NOTE — Assessment & Plan Note (Signed)
1.  Mild IgA deficiency: -She was evaluated for low IgA levels of 72. -Repeat immunoglobulin levels show IgA of 76.  IgG was normal.  She does not have any recurrent infections including sinopulmonary infections. -No active intervention necessary.  2.  Left upper quadrant pain and weight loss: -She lost about 12 pounds in the last 2 years.  Also reported left upper quadrant pain in the last couple of years. -CTAP on October 11, 2019 no clear abnormalities other than questionable findings for nutcracker syndrome. -I reviewed the images of the CT chest with contrast with her today.  6 x 7 mm subpleural posterior left upper lobe pulmonary nodule was seen.  She is a non-smoker.  There were also multiple hypervascular liver lesions measuring up to 1.6 cm, previously not seen on CT scan from January.  I think these are likely from bolus timing.  I do not believe MRI is necessary at this time.  Patient is also agreeable. -We will consider doing CT scan of the chest with contrast to evaluate both left upper lobe pulmonary nodule as well as liver at that time in 6 months.

## 2020-01-16 NOTE — Patient Instructions (Addendum)
[place] at [facility] Discharge Instructions  You were seen today by Dr. Delton Coombes. He went over your recent lab and test results. He will see you back in 6 months for labs, CT and follow up.   Thank you for choosing [place] at [facility] to provide your oncology and hematology care.  To afford each patient quality time with our provider, please arrive at least 15 minutes before your scheduled appointment time.   If you have a lab appointment with the [facility] please come in thru the  Main Entrance and check in at the main information desk  You need to re-schedule your appointment should you arrive 10 or more minutes late.  We strive to give you quality time with our providers, and arriving late affects you and other patients whose appointments are after yours.  Also, if you no show three or more times for appointments you may be dismissed from the clinic at the providers discretion.     Again, thank you for choosing [facility].  Our hope is that these requests will decrease the amount of time that you wait before being seen by our physicians.       _____________________________________________________________  Should you have questions after your visit to [facility], please contact our office at [phone] between the hours of 8:00 a.m. and 4:30 p.m.  Voicemails left after 4:00 p.m. will not be returned until the following business day.  For prescription refill requests, have your pharmacy contact our office and allow 72 hours.    Cancer Center Support Programs:   > Cancer Support Group  2nd Tuesday of the month 1pm-2pm, Journey Room

## 2020-01-16 NOTE — Progress Notes (Signed)
[place], [place] [address]   CLINIC:  Medical Oncology/Hematology  PCP:  Rory Percy, MD [place] [address] [phone]   REASON FOR VISIT:  Follow-up for mild decreased IgA levels.  CURRENT THERAPY: Observation.   INTERVAL HISTORY:  [name] 64 y.o. female seen for follow-up of mild decreased IgA levels.  Denies any recurrent infections.  Complains of fatigue.  She cares for her 49 year old son who has cerebral palsy.  Has intermittent left upper quadrant pain.  She is a non-smoker.  Reports appetite 75%.  Energy levels at 50%.  She lost about 10 to 12 pounds in the last 2 years.    REVIEW OF SYSTEMS:  Review of Systems  Constitutional: Positive for fatigue.  Gastrointestinal: Positive for abdominal pain.  All other systems reviewed and are negative.    PAST MEDICAL/SURGICAL HISTORY:  Past Medical History:  Diagnosis Date  . GERD (gastroesophageal reflux disease)   . Hyperlipidemia   . Osteopenia   . Osteoporosis   . Squamous cell skin cancer    Right leg   Past Surgical History:  Procedure Laterality Date  . BIOPSY  11/02/2019   Procedure: BIOPSY;  Surgeon: Daneil Dolin, MD;  Location: [facility];  Service: Endoscopy;;  gastric  . Biopsy right leg Right    skin  . COLONOSCOPY N/A 09/17/2017   Dr. Gala Romney: normal colonoscopy.  Next colonoscopy in 10 years.  . ESOPHAGOGASTRODUODENOSCOPY (EGD) WITH PROPOFOL N/A 11/02/2019   Dr. Gala Romney: Gastric mucosa coated by thick tenacious bile-stained mucus.  Stomach diffusely injected with scattered focal hemorrhagic erosions but no frank ulcer.  Small hiatal hernia.  Gastric biopsy showed nonspecific reactive gastropathy, no H. pylori.  Duodenum appeared normal.  . TONSILLECTOMY       SOCIAL HISTORY:  Social History   Socioeconomic History  . Marital status: Married    Spouse name: Not on file  . Number of children: 1  . Years of education: Not on file  .  Highest education level: Not on file  Occupational History  . Not on file  Tobacco Use  . Smoking status: Never Smoker  . Smokeless tobacco: Never Used  Substance and Sexual Activity  . Alcohol use: No  . Drug use: No  . Sexual activity: Yes  Other Topics Concern  . Not on file  Social History Narrative  . Not on file   Social Determinants of Health   Financial Resource Strain:   . Difficulty of Paying Living Expenses:   Food Insecurity:   . Worried About Charity fundraiser in the Last Year:   . Arboriculturist in the Last Year:   Transportation Needs:   . Film/video editor (Medical):   Marland Kitchen Lack of Transportation (Non-Medical):   Physical Activity:   . Days of Exercise per Week:   . Minutes of Exercise per Session:   Stress:   . Feeling of Stress :   Social Connections:   . Frequency of Communication with Friends and Family:   . Frequency of Social Gatherings with Friends and Family:   . Attends Religious Services:   . Active Member of Clubs or Organizations:   . Attends Archivist Meetings:   Marland Kitchen Marital Status:   Intimate Partner Violence:   . Fear of Current or Ex-Partner:   . Emotionally Abused:   Marland Kitchen Physically Abused:   . Sexually Abused:     FAMILY  HISTORY:  Family History  Problem Relation Age of Onset  . Osteoporosis Mother   . Kidney disease Mother   . Autoimmune disease Mother   . Leukemia Mother   . Parkinson's disease Father   . Dementia Father   . Kidney disease Father   . Stroke Father   . Angina Father   . Skin cancer Father   . Colon cancer Cousin   . Parkinson's disease Maternal Grandfather   . Heart attack Paternal Grandfather   . Cerebral palsy Son     CURRENT MEDICATIONS:  Outpatient Encounter Medications as of 01/16/2020  Medication Sig  . atorvastatin (LIPITOR) 10 MG tablet Take 5 mg by mouth every evening.   . Calcium-Phosphorus-Vitamin D (CITRACAL +D3 PO) Take 2 tablets by mouth 2 (two) times daily.  . cycloSPORINE  (RESTASIS) 0.05 % ophthalmic emulsion Place 1 drop into both eyes 2 (two) times daily.  Marland Kitchen ELDERBERRY PO Take 2 capsules by mouth daily.   Marland Kitchen omeprazole (PRILOSEC) 20 MG capsule Take 1 capsule (20 mg total) by mouth 2 (two) times daily before a meal.  . Wheat Dextrin (BENEFIBER) POWD Take 1 Dose by mouth daily. 2 tbsp   . acetaminophen (TYLENOL) 500 MG tablet Take 1,000 mg by mouth every 6 (six) hours as needed for moderate pain or headache.  . estradiol (ESTRACE VAGINAL) 0.1 MG/GM vaginal cream Place 1 Applicatorful 2 (two) times a week vaginally.   . valACYclovir (VALTREX) 1000 MG tablet Take 2,000 mg by mouth 2 (two) times daily as needed (fever blisters).    No facility-administered encounter medications on file as of 01/16/2020.    ALLERGIES:  Allergies  Allergen Reactions  . Amoxicillin     Severe fatigue  Did it involve swelling of the face/tongue/throat, SOB, or low BP? No Did it involve sudden or severe rash/hives, skin peeling, or any reaction on the inside of your mouth or nose? No Did you need to seek medical attention at a hospital or doctor's office? No When did it last happen?6 - 7 years If all above answers are "NO", may proceed with cephalosporin use.   . Bactrim [Sulfamethoxazole-Trimethoprim] Hives  . Keflex [Cephalexin] Hives  . Hydrocodone Nausea Only     PHYSICAL EXAM:  ECOG Performance status: 1  Vitals:   01/16/20 1523  BP: (!) 141/81  Pulse: 95  Resp: 18  Temp: 98.2 F (36.8 C)  SpO2: 100%   Filed Weights   01/16/20 1523  Weight: 109 lb 3.2 oz (49.5 kg)   Physical Exam Vitals reviewed.  Constitutional:      Appearance: Normal appearance.  Neurological:     General: No focal deficit present.     Mental Status: She is alert and oriented to person, place, and time.  Psychiatric:        Mood and Affect: Mood normal.        Behavior: Behavior normal.        Thought Content: Thought content normal.        Judgment: Judgment normal.       LABORATORY DATA:  I have reviewed the labs as listed.  CBC    Component Value Date/Time   WBC 6.5 12/19/2019 1529   RBC 4.26 12/19/2019 1529   HGB 12.9 12/19/2019 1529   HGB 12.8 10/03/2019 1345   HCT 40.1 12/19/2019 1529   HCT 39.0 10/03/2019 1345   PLT 302 12/19/2019 1529   PLT 273 10/03/2019 1345   MCV 94.1 12/19/2019 1529  MCV 92 10/03/2019 1345   MCH 30.3 12/19/2019 1529   MCHC 32.2 12/19/2019 1529   RDW 13.6 12/19/2019 1529   RDW 12.4 10/03/2019 1345   LYMPHSABS 1.3 12/19/2019 1529   LYMPHSABS 1.6 10/03/2019 1345   MONOABS 0.5 12/19/2019 1529   EOSABS 0.1 12/19/2019 1529   EOSABS 0.0 10/03/2019 1345   BASOSABS 0.1 12/19/2019 1529   BASOSABS 0.0 10/03/2019 1345   CMP Latest Ref Rng & Units 12/19/2019 11/28/2019 10/03/2019  Glucose 70 - 99 mg/dL 99 - 109(H)  BUN 8 - 23 mg/dL 22 - 10  Creatinine 0.44 - 1.00 mg/dL 0.69 - 0.89  Sodium 135 - 145 mmol/L 138 - 141  Potassium 3.5 - 5.1 mmol/L 3.7 - 4.5  Chloride 98 - 111 mmol/L 100 - 102  CO2 22 - 32 mmol/L 30 - 28  Calcium 8.9 - 10.3 mg/dL 9.1 - 9.6  Total Protein 6.5 - 8.1 g/dL 7.0 6.6 6.5  Total Bilirubin 0.3 - 1.2 mg/dL 0.4 0.3 0.2  Alkaline Phos 38 - 126 U/L 77 77 67  AST 15 - 41 U/L 32 27 27  ALT 0 - 44 U/L 26 14 14     DIAGNOSTIC IMAGING:  I have independently reviewed the scans and discussed with the patient.  ASSESSMENT & PLAN:  Low serum IgA for age 61.  Mild IgA deficiency: -She was evaluated for low IgA levels of 72. -Repeat immunoglobulin levels show IgA of 76.  IgG was normal.  She does not have any recurrent infections including sinopulmonary infections. -No active intervention necessary.  2.  Left upper quadrant pain and weight loss: -She lost about 12 pounds in the last 2 years.  Also reported left upper quadrant pain in the last couple of years. -CTAP on October 11, 2019 no clear abnormalities other than questionable findings for nutcracker syndrome. -I reviewed the images of the CT chest  with contrast with her today.  6 x 7 mm subpleural posterior left upper lobe pulmonary nodule was seen.  She is a non-smoker.  There were also multiple hypervascular liver lesions measuring up to 1.6 cm, previously not seen on CT scan from January.  I think these are likely from bolus timing.  I do not believe MRI is necessary at this time.  Patient is also agreeable. -We will consider doing CT scan of the chest with contrast to evaluate both left upper lobe pulmonary nodule as well as liver at that time in 6 months.     Orders placed this encounter:  Orders Placed This Encounter  Procedures  . CT Chest W Contrast  . CBC with Differential/Platelet  . Comprehensive metabolic panel  . IgG, IgA, IgM      Derek Jack, MD [place] [phone]

## 2020-01-17 ENCOUNTER — Encounter: Payer: Self-pay | Admitting: Gastroenterology

## 2020-01-22 ENCOUNTER — Encounter: Payer: Self-pay | Admitting: Gastroenterology

## 2020-01-22 ENCOUNTER — Telehealth: Payer: Self-pay | Admitting: Gastroenterology

## 2020-01-22 NOTE — Telephone Encounter (Signed)
Added patient to rmr schedule and left her messages on both phones to call us if she could not make it.

## 2020-01-22 NOTE — Telephone Encounter (Signed)
noted 

## 2020-01-22 NOTE — Telephone Encounter (Signed)
RMR has two openings in the office this week. Please get patient on his schedule. We are waiting for referral to [facility] for second opinion and I feel like she would benefit evaluation by RMR for dyspepsia, weight loss, liver lesions.

## 2020-01-22 NOTE — Telephone Encounter (Signed)
Patient called back. She stated she can not make it tomorrow of Friday. She cancelled tomorrow appt with RMR. He is only in office next Tuesday and he is already booked. She stated she would need an afternoon appt anyways. She can't make any morning appts. Patient aware after next week RMR will not be in the office for a while. She still stated she just can't make it to the appt. FYI to LSL and Stacy

## 2020-01-22 NOTE — Telephone Encounter (Signed)
Pt called office back, she can do appt tomorrow with RMR. Appt scheduled.

## 2020-01-23 ENCOUNTER — Ambulatory Visit: Payer: BC Managed Care – PPO | Admitting: Internal Medicine

## 2020-01-23 ENCOUNTER — Telehealth: Payer: Self-pay

## 2020-01-23 ENCOUNTER — Other Ambulatory Visit: Payer: Self-pay

## 2020-01-23 ENCOUNTER — Encounter: Payer: Self-pay | Admitting: Internal Medicine

## 2020-01-23 VITALS — BP 126/75 | HR 88 | Temp 97.3°F | Ht 66.0 in | Wt 108.6 lb

## 2020-01-23 DIAGNOSIS — R1012 Left upper quadrant pain: Secondary | ICD-10-CM

## 2020-01-23 DIAGNOSIS — R634 Abnormal weight loss: Secondary | ICD-10-CM | POA: Diagnosis not present

## 2020-01-23 NOTE — Progress Notes (Signed)
Patient in to discuss referral.  She would like to see Ileene Patrick at [facility] but it will be November before she can get in.  She is quite anxious about her symptoms.  We previously mutually agreed that a tertiary referral for second opinion was a good idea.  After some discussion, will redirect request for second opinion consultation to [facility] GI department.  Hopefully we will get her in much sooner than her slated appointment at [facility].  Potential hepatic abnormalities on a chest CT recently ordered by Dr. Delton Coombes.  He recommends a repeat abdominal CT in 6 months.  We will pursue referral to [facility] department.  Further recommendations to follow once we have feedback from them for review.

## 2020-01-23 NOTE — Patient Instructions (Addendum)
As discussed, we will re-direct second opinion referral regarding GI issues to [place];  Cancel referral to [facility]  As discussed, repeat CT of abdomen in 6 months as recommended by Dr. Jamey Reas  Will await to hear from second opinion.  Further recommendations to follow

## 2020-01-23 NOTE — Telephone Encounter (Signed)
Called [facility] for referral, spoke to [facility], pt will need appt scheduled with a MD since she has already seen a gastroenterologist. Soonest appt is 04/29/20 at 12:30pm with Dr. Jerene Pitch. Pt placed on cancellation list. She will receive packet in mail.   Tried to call pt to inform her of appt, no answer, LMOAM for return call.

## 2020-01-23 NOTE — Telephone Encounter (Signed)
Clinical notes faxed to [facility].  MyChart message sent to pt to inform her of appt at [facility].

## 2020-01-29 ENCOUNTER — Encounter: Payer: Self-pay | Admitting: Gastroenterology

## 2020-02-01 ENCOUNTER — Telehealth: Payer: Self-pay | Admitting: Gastroenterology

## 2020-02-01 DIAGNOSIS — K769 Liver disease, unspecified: Secondary | ICD-10-CM

## 2020-02-01 NOTE — Telephone Encounter (Signed)
Discussed case with RMR. He advises to pursue MRI Abd with and without contrast (with eovist if appropriate) to evaluate multiple hypervascular liver lesions seen on CT chest 12/2019.   Patient is agreeable. See mychart message.

## 2020-02-01 NOTE — Telephone Encounter (Signed)
MRI scheduled for 5/27 at 5:400pm, arrival 4:30pm, npo 4 hrs prior. [facility] message sent to patient with appt information.  PA approved for MRI via [employer] website. Auth# [id number] dates 02/01/2020-07/29/2020

## 2020-02-01 NOTE — Addendum Note (Signed)
Addended by: Cheron Every on: 02/01/2020 09:05 AM   Modules accepted: Orders

## 2020-02-22 ENCOUNTER — Ambulatory Visit (HOSPITAL_COMMUNITY)
Admission: RE | Admit: 2020-02-22 | Discharge: 2020-02-22 | Disposition: A | Discharge status: Home / Self Care | Payer: BC Managed Care – PPO | Source: Ambulatory Visit | Attending: Gastroenterology | Admitting: Gastroenterology

## 2020-02-22 ENCOUNTER — Other Ambulatory Visit: Payer: Self-pay

## 2020-02-22 DIAGNOSIS — K769 Liver disease, unspecified: Secondary | ICD-10-CM

## 2020-02-22 MED ORDER — GADOXETATE DISODIUM 0.25 MMOL/ML IV SOLN
5.0000 mL | Freq: Once | INTRAVENOUS | Status: AC | PRN
  Administered 2020-02-22: 5 mL via INTRAVENOUS

## 2020-02-27 ENCOUNTER — Telehealth: Payer: Self-pay | Admitting: Gastroenterology

## 2020-02-27 NOTE — Telephone Encounter (Signed)
Patient requesting MRI Abd report to be sent to her PCP, Dr. Delton Coombes, and Dr. Jerene Pitch ([place]).

## 2020-03-13 ENCOUNTER — Other Ambulatory Visit: Payer: Self-pay | Admitting: Gastroenterology

## 2020-07-16 ENCOUNTER — Inpatient Hospital Stay (HOSPITAL_COMMUNITY): Payer: BC Managed Care – PPO | Attending: Hematology

## 2020-07-16 ENCOUNTER — Ambulatory Visit (HOSPITAL_COMMUNITY)
Admission: RE | Admit: 2020-07-16 | Discharge: 2020-07-16 | Disposition: A | Discharge status: Home / Self Care | Payer: BC Managed Care – PPO | Source: Ambulatory Visit | Attending: Hematology | Admitting: Hematology

## 2020-07-16 ENCOUNTER — Other Ambulatory Visit: Payer: Self-pay

## 2020-07-16 DIAGNOSIS — R1012 Left upper quadrant pain: Secondary | ICD-10-CM | POA: Insufficient documentation

## 2020-07-16 DIAGNOSIS — Z79899 Other long term (current) drug therapy: Secondary | ICD-10-CM | POA: Insufficient documentation

## 2020-07-16 DIAGNOSIS — R634 Abnormal weight loss: Secondary | ICD-10-CM | POA: Diagnosis not present

## 2020-07-16 DIAGNOSIS — R911 Solitary pulmonary nodule: Secondary | ICD-10-CM | POA: Insufficient documentation

## 2020-07-16 DIAGNOSIS — I700 Atherosclerosis of aorta: Secondary | ICD-10-CM | POA: Diagnosis not present

## 2020-07-16 DIAGNOSIS — E785 Hyperlipidemia, unspecified: Secondary | ICD-10-CM | POA: Diagnosis not present

## 2020-07-16 DIAGNOSIS — D802 Selective deficiency of immunoglobulin A [IgA]: Secondary | ICD-10-CM | POA: Diagnosis not present

## 2020-07-16 DIAGNOSIS — D839 Common variable immunodeficiency, unspecified: Secondary | ICD-10-CM

## 2020-07-16 DIAGNOSIS — K219 Gastro-esophageal reflux disease without esophagitis: Secondary | ICD-10-CM | POA: Diagnosis not present

## 2020-07-16 DIAGNOSIS — M810 Age-related osteoporosis without current pathological fracture: Secondary | ICD-10-CM | POA: Diagnosis not present

## 2020-07-16 LAB — CBC WITH DIFFERENTIAL/PLATELET
Abs Immature Granulocytes: 0.01 10*3/uL (ref 0.00–0.07)
Basophils Absolute: 0 10*3/uL (ref 0.0–0.1)
Basophils Relative: 1 %
Eosinophils Absolute: 0.1 10*3/uL (ref 0.0–0.5)
Eosinophils Relative: 1 %
HCT: 39.2 % (ref 36.0–46.0)
Hemoglobin: 12.8 g/dL (ref 12.0–15.0)
Immature Granulocytes: 0 %
Lymphocytes Relative: 32 %
Lymphs Abs: 2.1 10*3/uL (ref 0.7–4.0)
MCH: 29.5 pg (ref 26.0–34.0)
MCHC: 32.7 g/dL (ref 30.0–36.0)
MCV: 90.3 fL (ref 80.0–100.0)
Monocytes Absolute: 0.5 10*3/uL (ref 0.1–1.0)
Monocytes Relative: 7 %
Neutro Abs: 3.9 10*3/uL (ref 1.7–7.7)
Neutrophils Relative %: 59 %
Platelets: 238 10*3/uL (ref 150–400)
RBC: 4.34 MIL/uL (ref 3.87–5.11)
RDW: 13.3 % (ref 11.5–15.5)
WBC: 6.5 10*3/uL (ref 4.0–10.5)
nRBC: 0 % (ref 0.0–0.2)

## 2020-07-16 LAB — COMPREHENSIVE METABOLIC PANEL
ALT: 22 U/L (ref 0–44)
AST: 26 U/L (ref 15–41)
Albumin: 4.4 g/dL (ref 3.5–5.0)
Alkaline Phosphatase: 74 U/L (ref 38–126)
Anion gap: 11 (ref 5–15)
BUN: 18 mg/dL (ref 8–23)
CO2: 27 mmol/L (ref 22–32)
Calcium: 8.9 mg/dL (ref 8.9–10.3)
Chloride: 100 mmol/L (ref 98–111)
Creatinine, Ser: 0.77 mg/dL (ref 0.44–1.00)
GFR, Estimated: >60 mL/min (ref >60)
Glucose, Bld: 89 mg/dL (ref 70–99)
Potassium: 3.7 mmol/L (ref 3.5–5.1)
Sodium: 138 mmol/L (ref 135–145)
Total Bilirubin: 0.7 mg/dL (ref 0.3–1.2)
Total Protein: 6.6 g/dL (ref 6.5–8.1)

## 2020-07-16 MED ORDER — IOHEXOL 300 MG/ML  SOLN
75.0000 mL | Freq: Once | INTRAMUSCULAR | Status: AC | PRN
  Administered 2020-07-16: 75 mL via INTRAVENOUS

## 2020-07-17 ENCOUNTER — Ambulatory Visit (HOSPITAL_COMMUNITY): Payer: BC Managed Care – PPO

## 2020-07-17 ENCOUNTER — Other Ambulatory Visit (HOSPITAL_COMMUNITY): Payer: BC Managed Care – PPO

## 2020-07-17 LAB — IGG, IGA, IGM
IgA: 84 mg/dL — ABNORMAL LOW (ref 87–352)
IgG (Immunoglobin G), Serum: 619 mg/dL (ref 586–1602)
IgM (Immunoglobulin M), Srm: 87 mg/dL (ref 26–217)

## 2020-07-24 ENCOUNTER — Inpatient Hospital Stay (HOSPITAL_COMMUNITY): Payer: BC Managed Care – PPO | Admitting: Hematology

## 2020-07-24 ENCOUNTER — Other Ambulatory Visit: Payer: Self-pay

## 2020-07-24 VITALS — BP 138/70 | HR 87 | Temp 97.3°F | Resp 18 | Wt 109.8 lb

## 2020-07-24 DIAGNOSIS — R911 Solitary pulmonary nodule: Secondary | ICD-10-CM

## 2020-07-24 DIAGNOSIS — R768 Other specified abnormal immunological findings in serum: Secondary | ICD-10-CM

## 2020-07-24 DIAGNOSIS — D802 Selective deficiency of immunoglobulin A [IgA]: Secondary | ICD-10-CM | POA: Diagnosis not present

## 2020-07-24 NOTE — Progress Notes (Signed)
[place], [place] [address]   CLINIC:  Medical Oncology/Hematology  PCP:  Oakley Nation, MD [address] / [place] [address]  [phone]  REASON FOR VISIT:  Follow-up for decreased IgA levels and lung nodule  PRIOR THERAPY: None  CURRENT THERAPY: Observation  INTERVAL HISTORY:  [name], a 64 y.o. female, returns for routine follow-up for her decreased IgA levels and lung nodule. Yarethzy was last seen on 01/16/2020.  Today she reports feeling well. She denies having any recent infections. She had a squamous cell skin cancer removed from her right leg. She is trying to track her steps to reach 10,000 steps per day. She is keeping to a gluten-free diet and her abdominal pain improved afterwards.  She has already received her COVID vaccine and flu shot and will get her [place] booster soon.   REVIEW OF SYSTEMS:  Review of Systems  Constitutional: Positive for appetite change (75%) and fatigue (40%).  Gastrointestinal: Negative for abdominal pain.  Genitourinary: Positive for frequency (& urgency) and hematuria.   Neurological: Positive for dizziness.  All other systems reviewed and are negative.   PAST MEDICAL/SURGICAL HISTORY:  Past Medical History:  Diagnosis Date  . GERD (gastroesophageal reflux disease)   . Hyperlipidemia   . Osteopenia   . Osteoporosis   . Squamous cell skin cancer    Right leg   Past Surgical History:  Procedure Laterality Date  . BIOPSY  11/02/2019   Procedure: BIOPSY;  Surgeon: Daneil Dolin, MD;  [facility]: [facility];  Service: Endoscopy;;  gastric  . Biopsy right leg Right    skin  . COLONOSCOPY N/A 09/17/2017   Dr. Gala Romney: normal colonoscopy.  Next colonoscopy in 10 years.  . ESOPHAGOGASTRODUODENOSCOPY (EGD) WITH PROPOFOL N/A 11/02/2019   Dr. Gala Romney: Gastric mucosa coated by thick tenacious bile-stained mucus.  Stomach diffusely injected with scattered focal hemorrhagic erosions  but no frank ulcer.  Small hiatal hernia.  Gastric biopsy showed nonspecific reactive gastropathy, no H. pylori.  Duodenum appeared normal.  . TONSILLECTOMY      SOCIAL HISTORY:  Social History   Socioeconomic History  . Marital status: Married    Spouse name: Not on file  . Number of children: 1  . Years of education: Not on file  . Highest education level: Not on file  Occupational History  . Not on file  Tobacco Use  . Smoking status: Never Smoker  . Smokeless tobacco: Never Used  Vaping Use  . Vaping Use: Never used  Substance and Sexual Activity  . Alcohol use: No  . Drug use: No  . Sexual activity: Yes  Other Topics Concern  . Not on file  Social History Narrative  . Not on file   Social Determinants of Health   Financial Resource Strain:   . Difficulty of Paying Living Expenses: Not on file  Food Insecurity:   . Worried About Charity fundraiser in the Last Year: Not on file  . Ran Out of Food in the Last Year: Not on file  Transportation Needs:   . Lack of Transportation (Medical): Not on file  . Lack of Transportation (Non-Medical): Not on file  Physical Activity:   . Days of Exercise per Week: Not on file  . Minutes of Exercise per Session: Not on file  Stress:   . Feeling of Stress : Not on file  Social Connections:   . Frequency of  Communication with Friends and Family: Not on file  . Frequency of Social Gatherings with Friends and Family: Not on file  . Attends Religious Services: Not on file  . Active Member of Clubs or Organizations: Not on file  . Attends Archivist Meetings: Not on file  . Marital Status: Not on file  Intimate Partner Violence:   . Fear of Current or Ex-Partner: Not on file  . Emotionally Abused: Not on file  . Physically Abused: Not on file  . Sexually Abused: Not on file    FAMILY HISTORY:  Family History  Problem Relation Age of Onset  . Osteoporosis Mother   . Kidney disease Mother   . Autoimmune disease  Mother   . Leukemia Mother   . Parkinson's disease Father   . Dementia Father   . Kidney disease Father   . Stroke Father   . Angina Father   . Skin cancer Father   . Colon cancer Cousin   . Parkinson's disease Maternal Grandfather   . Heart attack Paternal Grandfather   . Cerebral palsy Son     CURRENT MEDICATIONS:  Current Outpatient Medications  Medication Sig Dispense Refill  . atorvastatin (LIPITOR) 10 MG tablet Take 5 mg by mouth every evening.   1  . cycloSPORINE (RESTASIS) 0.05 % ophthalmic emulsion Place 1 drop into both eyes 2 (two) times daily.    Marland Kitchen ELDERBERRY PO Take 2 capsules by mouth daily.     Marland Kitchen estradiol (ESTRACE VAGINAL) 0.1 MG/GM vaginal cream Place 1 Applicatorful 2 (two) times a week vaginally.     . magnesium citrate SOLN Take 1 Bottle by mouth once.    . Multiple Vitamin (MULTIVITAMIN) tablet Take 1 tablet by mouth daily.    . Multiple Vitamins-Minerals (ALGAE BASED CALCIUM PO) Take by mouth.    . Probiotic Product (PROBIOTIC DAILY PO) Take by mouth.    . Strontium Chloride POWD by Does not apply route. capsules    . valACYclovir (VALTREX) 1000 MG tablet Take 2,000 mg by mouth 2 (two) times daily as needed (fever blisters).     . Vitamin D-Vitamin K (K2 PLUS D3 PO) Take by mouth. 5000 units    . Wheat Dextrin (BENEFIBER) POWD Take 1 Dose by mouth daily. 2 tbsp     . acetaminophen (TYLENOL) 500 MG tablet Take 1,000 mg by mouth every 6 (six) hours as needed for moderate pain or headache. (Patient not taking: Reported on 07/24/2020)     No current facility-administered medications for this visit.    ALLERGIES:  Allergies  Allergen Reactions  . Amoxicillin     Severe fatigue  Did it involve swelling of the face/tongue/throat, SOB, or low BP? No Did it involve sudden or severe rash/hives, skin peeling, or any reaction on the inside of your mouth or nose? No Did you need to seek medical attention at a hospital or doctor's office? No When did it last  happen?6 - 7 years If all above answers are "NO", may proceed with cephalosporin use.   . Bactrim [Sulfamethoxazole-Trimethoprim] Hives  . Keflex [Cephalexin] Hives  . Hydrocodone Nausea Only    PHYSICAL EXAM:  Performance status (ECOG): 1 - Symptomatic but completely ambulatory  Vitals:   07/24/20 1526  BP: 138/70  Pulse: 87  Resp: 18  Temp: (!) 97.3 F (36.3 C)  SpO2: 100%   Wt Readings from Last 3 Encounters:  07/24/20 109 lb 12.8 oz (49.8 kg)  01/23/20 108 lb 9.6 oz (49.3  kg)  01/16/20 109 lb 3.2 oz (49.5 kg)   Physical Exam Vitals reviewed.  Constitutional:      Appearance: Normal appearance.  Cardiovascular:     Rate and Rhythm: Normal rate and regular rhythm.     Pulses: Normal pulses.     Heart sounds: Normal heart sounds.  Pulmonary:     Effort: Pulmonary effort is normal.     Breath sounds: Normal breath sounds.  Abdominal:     Palpations: Abdomen is soft. There is no hepatomegaly, splenomegaly or mass.     Tenderness: There is no abdominal tenderness.     Hernia: No hernia is present.  Musculoskeletal:     Right lower leg: No edema.     Left lower leg: No edema.  Lymphadenopathy:     Upper Body:     Right upper body: No supraclavicular, axillary or pectoral adenopathy.     Left upper body: No supraclavicular, axillary or pectoral adenopathy.  Neurological:     General: No focal deficit present.     Mental Status: She is alert and oriented to person, place, and time.  Psychiatric:        Mood and Affect: Mood normal.        Behavior: Behavior normal.     LABORATORY DATA:  I have reviewed the labs as listed.  CBC Latest Ref Rng & Units 07/16/2020 12/19/2019 10/03/2019  WBC 4.0 - 10.5 K/uL 6.5 6.5 6.4  Hemoglobin 12.0 - 15.0 g/dL 12.8 12.9 12.8  Hematocrit 36 - 46 % 39.2 40.1 39.0  Platelets 150 - 400 K/uL 238 302 273   CMP Latest Ref Rng & Units 07/16/2020 12/19/2019 11/28/2019  Glucose 70 - 99 mg/dL 89 99 -  BUN 8 - 23 mg/dL 18 22 -    Creatinine 0.44 - 1.00 mg/dL 0.77 0.69 -  Sodium 135 - 145 mmol/L 138 138 -  Potassium 3.5 - 5.1 mmol/L 3.7 3.7 -  Chloride 98 - 111 mmol/L 100 100 -  CO2 22 - 32 mmol/L 27 30 -  Calcium 8.9 - 10.3 mg/dL 8.9 9.1 -  Total Protein 6.5 - 8.1 g/dL 6.6 7.0 6.6  Total Bilirubin 0.3 - 1.2 mg/dL 0.7 0.4 0.3  Alkaline Phos 38 - 126 U/L 74 77 77  AST 15 - 41 U/L 26 32 27  ALT 0 - 44 U/L 22 26 14       Component Value Date/Time   RBC 4.34 07/16/2020 1349   MCV 90.3 07/16/2020 1349   MCV 92 10/03/2019 1345   MCH 29.5 07/16/2020 1349   MCHC 32.7 07/16/2020 1349   RDW 13.3 07/16/2020 1349   RDW 12.4 10/03/2019 1345   LYMPHSABS 2.1 07/16/2020 1349   LYMPHSABS 1.6 10/03/2019 1345   MONOABS 0.5 07/16/2020 1349   EOSABS 0.1 07/16/2020 1349   EOSABS 0.0 10/03/2019 1345   BASOSABS 0.0 07/16/2020 1349   BASOSABS 0.0 10/03/2019 1345   Lab Results  Component Value Date   IGGSERUM 619 07/16/2020   IGGSERUM 607 12/19/2019   IGA 84 (L) 07/16/2020   IGA 76 (L) 12/19/2019   IGMSERUM 87 07/16/2020   IGMSERUM 84 12/19/2019     DIAGNOSTIC IMAGING:  I have independently reviewed the scans and discussed with the patient. CT Chest W Contrast  Result Date: 07/17/2020 CLINICAL DATA:  Follow-up left upper lobe pulmonary nodule. EXAM: CT CHEST WITH CONTRAST TECHNIQUE: Multidetector CT imaging of the chest was performed during intravenous contrast administration. CONTRAST:  56mL OMNIPAQUE IOHEXOL 300  MG/ML  SOLN COMPARISON:  01/04/2020 FINDINGS: Cardiovascular: The heart is normal in size. No pericardial effusion. The aorta is normal in caliber. No dissection. The branch vessels are patent. No definite aortic or coronary artery calcifications in the chest. Mediastinum/Nodes: No mediastinal or hilar mass or adenopathy. The esophagus is grossly normal. The thyroid gland is unremarkable. Lungs/Pleura: The subpleural left upper lobe pulmonary nodule on image number 38/4 measures a maximum of 8 x 5 mm. This is  unchanged when compared to the prior study and measured along the same imaging planes. Stable biapical pleural and parenchymal scarring type changes. No new pulmonary lesions or pulmonary infiltrates. No pleural effusions or pleural lesions. Upper Abdomen: No significant upper abdominal findings. Scattered aortic calcifications. Musculoskeletal: No breast masses, supraclavicular or axillary adenopathy. Thyroid gland is unremarkable. The bony thorax is intact. IMPRESSION: 1. Stable 8 x 5 mm subpleural left upper lobe pulmonary nodule. Recommend continued surveillance. Recommend follow-up noncontrast chest CT in April 2022 which would be a 1 year follow-up from the initial chest CT. 2. No mediastinal or hilar mass or adenopathy. 3. Stable biapical pleural and parenchymal scarring type changes. 4. Aortic atherosclerosis. Aortic Atherosclerosis (ICD10-I70.0). Electronically Signed   By: Marijo Sanes M.D.   On: 07/17/2020 13:58     ASSESSMENT:  1.  Mild IgA deficiency: -She was evaluated for low IgA levels of 72. -Repeat immunoglobulin levels show IgA of 76.  IgG was normal.  She does not have any recurrent infections including sinopulmonary infections. -No active intervention necessary.  2.  Left upper lobe pulmonary nodule: -CT chest on 07/16/2020 shows stable 8 x 5 mm subpleural left upper lobe pulmonary nodule with no mediastinal or hilar adenopathy.  Stable biapical pleural and parenchymal scarring type changes. -She is non-smoker.   PLAN:  1.  Mild IgA deficiency: -Reviewed quantitative immunoglobulin levels from 07/16/2020.  IgA improved to 84.  IgM and IgG were normal.  No recurrent infections noted.  2.  Left upper quadrant pain and weight loss: -Left upper quadrant pain improved after she started gluten-free diet. -Weight loss has stabilized. -MRI of the abdomen on 02/22/2020 showed cavernous hemangioma in segment 4A.  3.  Left upper lobe lung nodule: -We reviewed CT chest with contrast  from 07/16/2020 which showed stable 8 x 5 mm subpleural left upper lobe pulmonary nodule.  She is a non-smoker. -We will repeat CT chest without contrast in April 2022.   Orders placed this encounter:  Orders Placed This Encounter  Procedures  . CT Chest Wo Contrast  . CBC with Differential/Platelet  . Comprehensive metabolic panel  . IgG, IgA, IgM     Derek Jack, MD [facility] [phone]   I, Milinda Antis, am acting as a scribe for Dr. Sanda Linger.  I, Derek Jack MD, have reviewed the above documentation for accuracy and completeness, and I agree with the above.

## 2020-07-24 NOTE — Patient Instructions (Signed)
[facility] at [facility] Discharge Instructions  You were seen today by Dr. Katragadda. He went over your recent results. You will be scheduled for a CT scan of your chest before your next visit. Dr. Katragadda will see you back in 6 months for labs and follow up.   Thank you for choosing [facility] at [facility] to provide your oncology and hematology care.  To afford each patient quality time with our provider, please arrive at least 15 minutes before your scheduled appointment time.   If you have a lab appointment with the Cancer Center please come in thru the Main Entrance and check in at the main information desk  You need to re-schedule your appointment should you arrive 10 or more minutes late.  We strive to give you quality time with our providers, and arriving late affects you and other patients whose appointments are after yours.  Also, if you no show three or more times for appointments you may be dismissed from the clinic at the providers discretion.     Again, thank you for choosing [facility].  Our hope is that these requests will decrease the amount of time that you wait before being seen by our physicians.       _____________________________________________________________  Should you have questions after your visit to [facility], please contact our office at [phone] between the hours of 8:00 a.m. and 4:30 p.m.  Voicemails left after 4:00 p.m. will not be returned until the following business day.  For prescription refill requests, have your pharmacy contact our office and allow 72 hours.    Cancer Center Support Programs:   > Cancer Support Group  2nd Tuesday of the month 1pm-2pm, Journey Room    

## 2020-08-05 ENCOUNTER — Ambulatory Visit: Payer: BC Managed Care – PPO | Admitting: Urology

## 2020-08-07 ENCOUNTER — Other Ambulatory Visit: Payer: Self-pay

## 2020-08-07 ENCOUNTER — Encounter: Payer: Self-pay | Admitting: Urology

## 2020-08-07 ENCOUNTER — Ambulatory Visit (INDEPENDENT_AMBULATORY_CARE_PROVIDER_SITE_OTHER): Payer: BC Managed Care – PPO | Admitting: Urology

## 2020-08-07 VITALS — BP 127/73 | HR 91 | Temp 98.8°F | Ht 66.0 in | Wt 109.8 lb

## 2020-08-07 DIAGNOSIS — R3129 Other microscopic hematuria: Secondary | ICD-10-CM | POA: Insufficient documentation

## 2020-08-07 DIAGNOSIS — R310 Gross hematuria: Secondary | ICD-10-CM | POA: Diagnosis not present

## 2020-08-07 LAB — MICROSCOPIC EXAMINATION: Renal Epithel, UA: NONE SEEN /hpf

## 2020-08-07 LAB — URINALYSIS, ROUTINE W REFLEX MICROSCOPIC
Bilirubin, UA: NEGATIVE
Glucose, UA: NEGATIVE
Ketones, UA: NEGATIVE
Leukocytes,UA: NEGATIVE
Nitrite, UA: NEGATIVE
Protein,UA: NEGATIVE
Specific Gravity, UA: 1.015 (ref 1.005–1.030)
Urobilinogen, Ur: 0.2 mg/dL (ref 0.2–1.0)
pH, UA: 7.5 (ref 5.0–7.5)

## 2020-08-07 LAB — BLADDER SCAN AMB NON-IMAGING: Scan Result: 107

## 2020-08-07 NOTE — Progress Notes (Signed)
Bladder Scan Patient can void: 107 ml Performed By: Durenda Guthrie, LPN   Urological Symptom Review  Patient is experiencing the following symptoms: Frequent urination Get up at night to urinate Trouble starting stream Blood in urine   Review of Systems  Gastrointestinal (upper)  : Negative for upper GI symptoms  Gastrointestinal (lower) : Negative for lower GI symptoms  Constitutional : Weight loss  Skin: Skin rash/lesion  Eyes: Negative for eye symptoms  Ear/Nose/Throat : Negative for Ear/Nose/Throat symptoms  Hematologic/Lymphatic: Negative for Hematologic/Lymphatic symptoms  Cardiovascular : Negative for cardiovascular symptoms  Respiratory : Negative for respiratory symptoms  Endocrine: Negative for endocrine symptoms  Musculoskeletal: Back pain Joint pain  Neurological: Negative for neurological symptoms  Psychologic: Negative for psychiatric symptoms

## 2020-08-07 NOTE — Progress Notes (Signed)
08/07/2020 2:40 PM   [name] 02/11/1956 [id number]  Referring provider: Rory Percy, MD [place],  [place] [address]  microhematuria  HPI: [name] is a [phone] here for evaluation of microhematuria. She was previously seen by Dr. Jeffie Pollock and had a negative cystoscopy in 06/2016. She has been told on numerous occasions she has blood on urine dipstick. UA shows trace blood and microscopy showed no RBCs. She had a UA in 10/2019 which also showed no RBCs. CT abd/pelvis in 09/2019 which showed no GU tumors and no calculi.  She has urinary frequency and nocturia but no other significant LUTS  Her records for AUS are as follows:  I have blood in my urine.  HPI: [name] is a 64 year-old female patient who was referred by Dr. Thomasene Lot. Nadara Mustard, MD who is here for blood in the urine.  She did not see the blood in her urine. She has not seen blood clots.   She does not have a burning sensation when she urinates. She is not currently having trouble urinating.   She is not having pain. She has not recently had unwanted weight loss.   [name] is a 64 yo WF who is sent by Dr. Rory Percy for microhematuria found on 2 dip UA's in August and September. She had 1+ and 2 + but micro's weren't done. She has a clear urine today. She has had some low back issues and has had some frequency and urgency with that. It started about 5 years ago but has been variable. She has no incontinence. She has no nocturia. She has had no gross hematuria or dysuria. She has had no history of stones, recent UTI's or GU surgery. She has no history of tobacco use. She had early menopause in 2001.      PMH: Past Medical History:  Diagnosis Date  . GERD (gastroesophageal reflux disease)   . Hyperlipidemia   . Lung nodule    left   . Osteopenia   . Osteoporosis   . Squamous cell skin cancer    Right leg    Surgical History: Past Surgical History:  Procedure Laterality Date  . BIOPSY  11/02/2019   Procedure:  BIOPSY;  Surgeon: Daneil Dolin, MD;  [facility];  Service: Endoscopy;;  gastric  . Biopsy right leg Right    skin  . COLONOSCOPY N/A 09/17/2017   Dr. Gala Romney: normal colonoscopy.  Next colonoscopy in 10 years.  . ESOPHAGOGASTRODUODENOSCOPY (EGD) WITH PROPOFOL N/A 11/02/2019   Dr. Gala Romney: Gastric mucosa coated by thick tenacious bile-stained mucus.  Stomach diffusely injected with scattered focal hemorrhagic erosions but no frank ulcer.  Small hiatal hernia.  Gastric biopsy showed nonspecific reactive gastropathy, no H. pylori.  Duodenum appeared normal.  . TONSILLECTOMY      Home Medications:  Allergies as of 08/07/2020      Reactions   Amoxicillin    Severe fatigue  Did it involve swelling of the face/tongue/throat, SOB, or low BP? No Did it involve sudden or severe rash/hives, skin peeling, or any reaction on the inside of your mouth or nose? No Did you need to seek medical attention at a hospital or doctor's office? No When did it last happen?6 - 7 years If all above answers are "NO", may proceed with cephalosporin use.   Bactrim [sulfamethoxazole-trimethoprim] Hives   Keflex [cephalexin] Hives   Hydrocodone Nausea Only      Medication List  Accurate as of August 07, 2020  2:40 PM. If you have any questions, ask your nurse or doctor.        acetaminophen 500 MG tablet Commonly known as: TYLENOL Take 1,000 mg by mouth every 6 (six) hours as needed for moderate pain or headache.   ALGAE BASED CALCIUM PO Take by mouth.   atorvastatin 10 MG tablet Commonly known as: LIPITOR Take 5 mg by mouth every evening.   Benefiber Powd Take 1 Dose by mouth daily. 2 tbsp   cycloSPORINE 0.05 % ophthalmic emulsion Commonly known as: RESTASIS Place 1 drop into both eyes 2 (two) times daily.   ELDERBERRY PO Take 2 capsules by mouth daily.   ESTRACE VAGINAL 0.1 MG/GM vaginal cream Generic drug: estradiol Place 1 Applicatorful 2 (two) times a week  vaginally.   K2 PLUS D3 PO Take by mouth. 5000 units   magnesium citrate Soln Take 1 Bottle by mouth once.   multivitamin tablet Take 1 tablet by mouth daily.   PROBIOTIC DAILY PO Take by mouth.   Strontium Chloride Powd by Does not apply route. capsules   valACYclovir 1000 MG tablet Commonly known as: VALTREX Take 2,000 mg by mouth 2 (two) times daily as needed (fever blisters).       Allergies:  Allergies  Allergen Reactions  . Amoxicillin     Severe fatigue  Did it involve swelling of the face/tongue/throat, SOB, or low BP? No Did it involve sudden or severe rash/hives, skin peeling, or any reaction on the inside of your mouth or nose? No Did you need to seek medical attention at a hospital or doctor's office? No When did it last happen?6 - 7 years If all above answers are "NO", may proceed with cephalosporin use.   . Bactrim [Sulfamethoxazole-Trimethoprim] Hives  . Keflex [Cephalexin] Hives  . Hydrocodone Nausea Only    Family History: Family History  Problem Relation Age of Onset  . Osteoporosis Mother   . Kidney disease Mother   . Autoimmune disease Mother   . Leukemia Mother   . Parkinson's disease Father   . Dementia Father   . Kidney disease Father   . Stroke Father   . Angina Father   . Skin cancer Father   . Colon cancer Cousin   . Parkinson's disease Maternal Grandfather   . Heart attack Paternal Grandfather   . Cerebral palsy Son     Social History:  reports that she has never smoked. She has never used smokeless tobacco. She reports that she does not drink alcohol and does not use drugs.  ROS: All other review of systems were reviewed and are negative except what is noted above in HPI  Physical Exam: BP 127/73   Pulse 91   Temp 98.8 F (37.1 C)   Ht 5\' 6"  (1.676 m)   Wt 109 lb 12.8 oz (49.8 kg)   BMI 17.72 kg/m   Constitutional:  Alert and oriented, No acute distress. HEENT: [place] AT, moist mucus membranes.  Trachea midline,  no masses. Cardiovascular: No clubbing, cyanosis, or edema. Respiratory: Normal respiratory effort, no increased work of breathing. GI: Abdomen is soft, nontender, nondistended, no abdominal masses GU: No CVA tenderness.  Lymph: No cervical or inguinal lymphadenopathy. Skin: No rashes, bruises or suspicious lesions. Neurologic: Grossly intact, no focal deficits, moving all 4 extremities. Psychiatric: Normal mood and affect.  Laboratory Data: Lab Results  Component Value Date   WBC 6.5 07/16/2020   HGB 12.8 07/16/2020   HCT  39.2 07/16/2020   MCV 90.3 07/16/2020   PLT 238 07/16/2020    Lab Results  Component Value Date   CREATININE 0.77 07/16/2020    No results found for: PSA  No results found for: TESTOSTERONE  Lab Results  Component Value Date   HGBA1C 5.7 (H) 11/28/2019    Urinalysis    Component Value Date/Time   APPEARANCEUR Clear 08/07/2020 1407   GLUCOSEU Negative 08/07/2020 1407   BILIRUBINUR Negative 08/07/2020 1407   PROTEINUR Negative 08/07/2020 1407   NITRITE Negative 08/07/2020 1407   LEUKOCYTESUR Negative 08/07/2020 1407    Lab Results  Component Value Date   LABMICR See below: 08/07/2020   WBCUA 0-5 08/07/2020   LABEPIT 0-10 08/07/2020   MUCUS Present 10/31/2019   BACTERIA Few 08/07/2020    Pertinent Imaging: CT 09/2019: Images reviewed and discussed with the patient  No results found for this or any previous visit.  No results found for this or any previous visit.  No results found for this or any previous visit.  No results found for this or any previous visit.  No results found for this or any previous visit.  No results found for this or any previous visit.  No results found for this or any previous visit.  No results found for this or any previous visit.   Assessment & Plan:    1. Microscopic hematuria -We discussed the natural hx of microhematuria and the various causes. He UA have been negative for RBC this past year and  she had a normal CT in 09/2019. I will see her back in 6 months with UA - Urinalysis, Routine w reflex microscopic - BLADDER SCAN AMB NON-IMAGING   No follow-ups on file.  Nicolette Bang, MD  [facility] [place]

## 2020-08-07 NOTE — Patient Instructions (Signed)
Hematuria, Adult Hematuria is blood in the urine. Blood may be visible in the urine, or it may be identified with a test. This condition can be caused by infections of the bladder, urethra, kidney, or prostate. Other possible causes include:  Kidney stones.  Cancer of the urinary tract.  Too much calcium in the urine.  Conditions that are passed from parent to child (inherited conditions).  Exercise that requires a lot of energy. Infections can usually be treated with medicine, and a kidney stone usually will pass through your urine. If neither of these is the cause of your hematuria, more tests may be needed to identify the cause of your symptoms. It is very important to tell your health care provider about any blood in your urine, even if it is painless or the blood stops without treatment. Blood in the urine, when it happens and then stops and then happens again, can be a symptom of a very serious condition, including cancer. There is no pain in the initial stages of many urinary cancers. Follow these instructions at home: Medicines  Take over-the-counter and prescription medicines only as told by your health care provider.  If you were prescribed an antibiotic medicine, take it as told by your health care provider. Do not stop taking the antibiotic even if you start to feel better. Eating and drinking  Drink enough fluid to keep your urine clear or pale yellow. It is recommended that you drink 3-4 quarts (2.8-3.8 L) a day. If you have been diagnosed with an infection, it is recommended that you drink cranberry juice in addition to large amounts of water.  Avoid caffeine, tea, and carbonated beverages. These tend to irritate the bladder.  Avoid alcohol because it may irritate the prostate (men). General instructions  If you have been diagnosed with a kidney stone, follow your health care provider's instructions about straining your urine to catch the stone.  Empty your bladder  often. Avoid holding urine for long periods of time.  If you are female: ? After a bowel movement, wipe from front to back and use each piece of toilet paper only once. ? Empty your bladder before and after sex.  Pay attention to any changes in your symptoms. Tell your health care provider about any changes or any new symptoms.  It is your responsibility to get your test results. Ask your health care provider, or the department performing the test, when your results will be ready.  Keep all follow-up visits as told by your health care provider. This is important. Contact a health care provider if:  You develop back pain.  You have a fever.  You have nausea or vomiting.  Your symptoms do not improve after 3 days.  Your symptoms get worse. Get help right away if:  You develop severe vomiting and are unable take medicine without vomiting.  You develop severe pain in your back or abdomen even though you are taking medicine.  You pass a large amount of blood in your urine.  You pass blood clots in your urine.  You feel very weak or like you might faint.  You faint. Summary  Hematuria is blood in the urine. It has many possible causes.  It is very important that you tell your health care provider about any blood in your urine, even if it is painless or the blood stops without treatment.  Take over-the-counter and prescription medicines only as told by your health care provider.  Drink enough fluid to keep   your urine clear or pale yellow. This information is not intended to replace advice given to you by your health care provider. Make sure you discuss any questions you have with your health care provider. Document Revised: 02/08/2019 Document Reviewed: 10/17/2016 Elsevier Patient Education  2020 Elsevier Inc.  

## 2020-12-20 IMAGING — CT CT CHEST W/ CM
2 of 6 series · 14 of 36 positions shown, 18 images · IV contrast (Omnipaque or Isovue)
Comparison: None.

CLINICAL DATA: Unintentional weight loss.

EXAM:
CT CHEST WITH CONTRAST
TECHNIQUE: Multidetector CT imaging of the chest was performed during
intravenous contrast administration.
CONTRAST:  75mL OMNIPAQUE IOHEXOL 300 MG/ML  SOLN

[Series 3: routine chest with · axial · 0.54mm/px · z∈[+881,+1173]mm · 13 of 160 slices shown, 17 images]
[im 7/160  mediastinal]
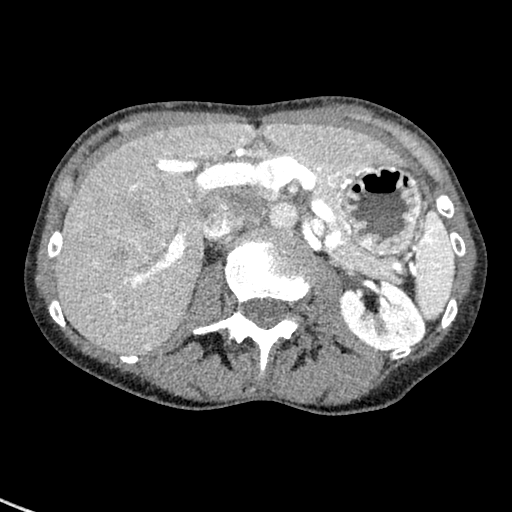
[im 7/160  lung]
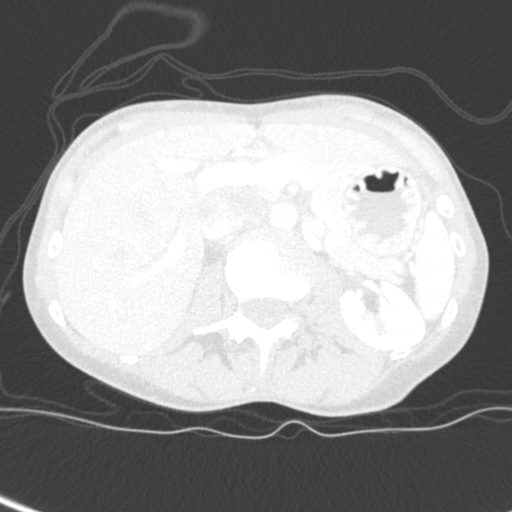
[im 20/160  lung]
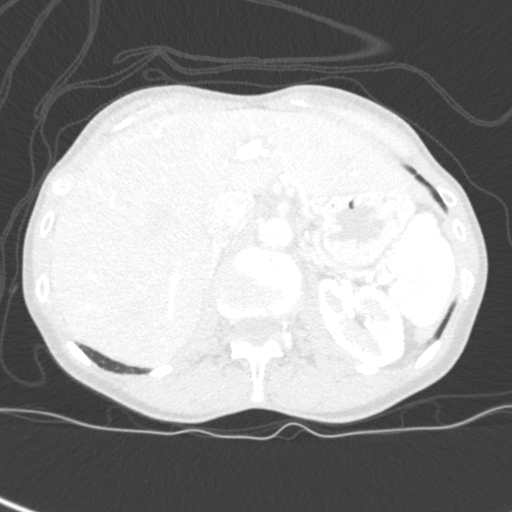
[im 34/160  lung]
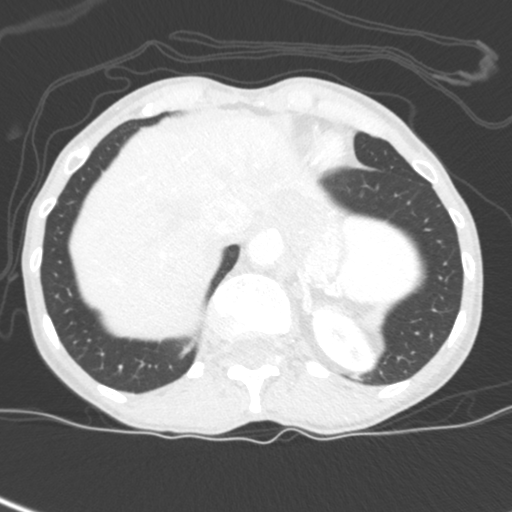
[im 47/160  lung]
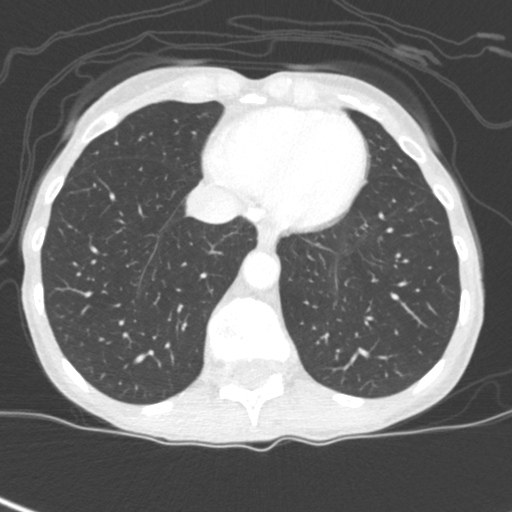
[im 54/160  mediastinal]
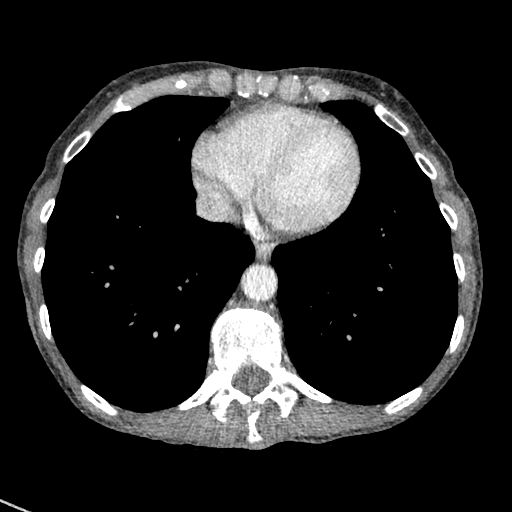
[im 54/160  lung]
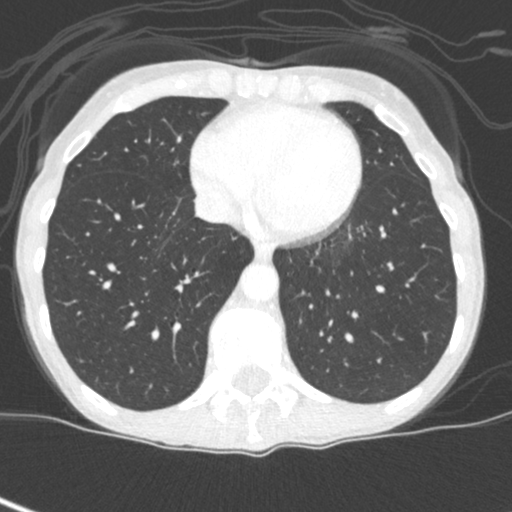
[im 67/160  lung]
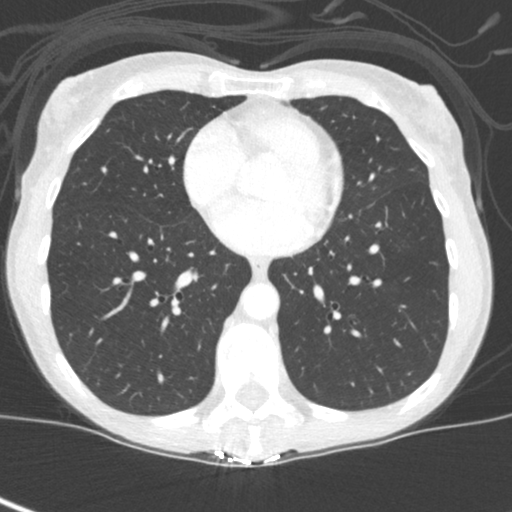
[im 80/160  lung]
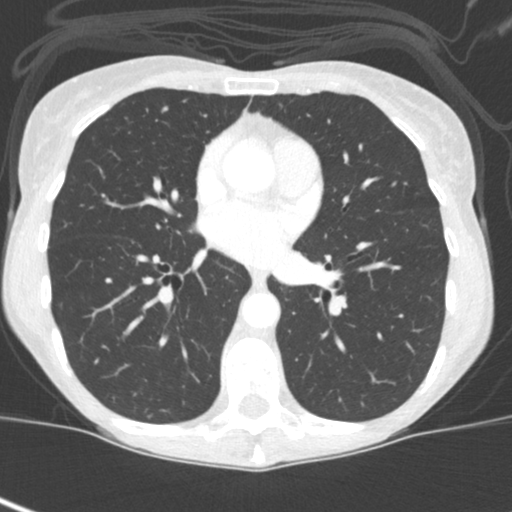
[im 93/160  lung]
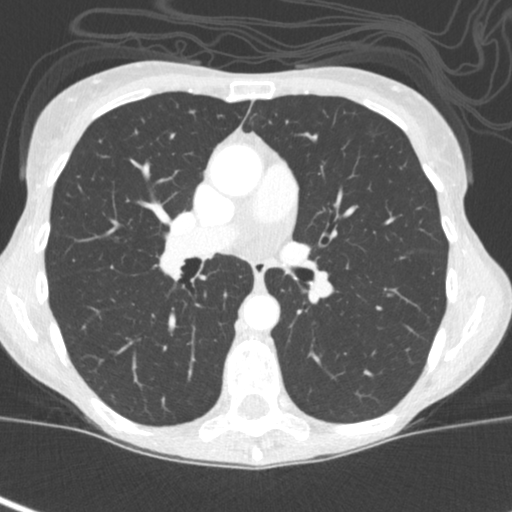
[im 107/160  mediastinal]
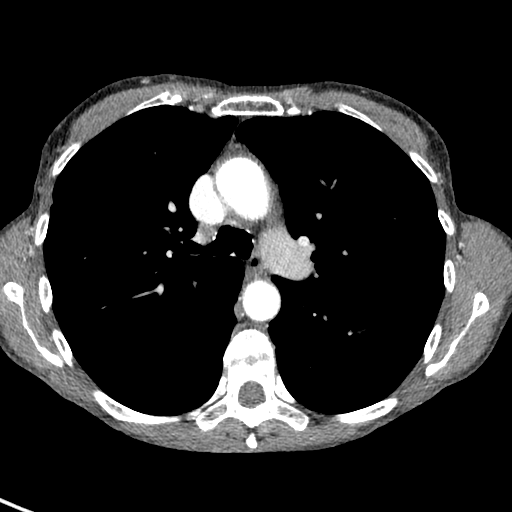
[im 107/160  lung]
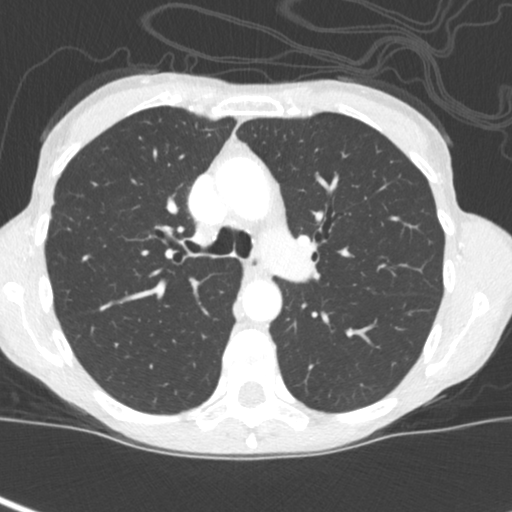
[im 113/160  lung]
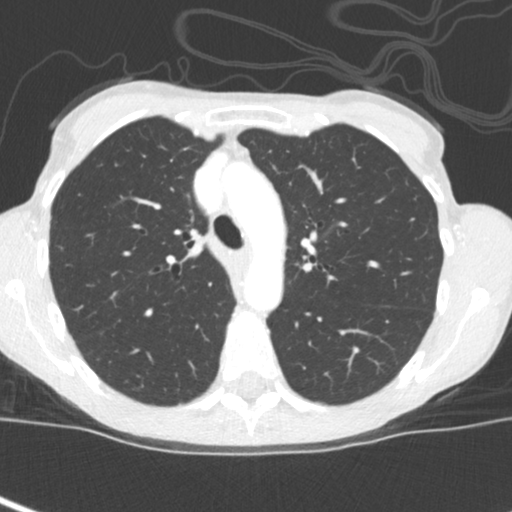
[im 126/160  lung]
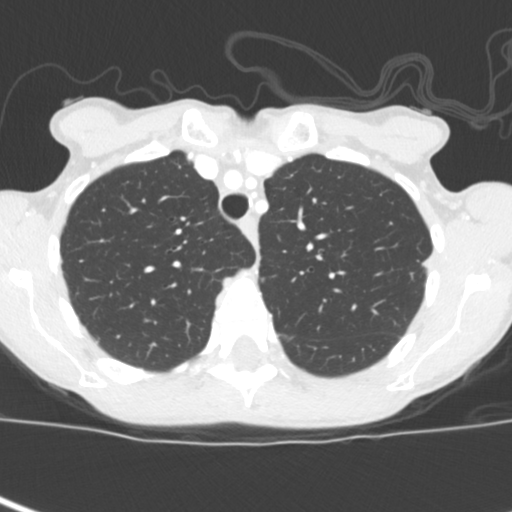
[im 140/160  lung]
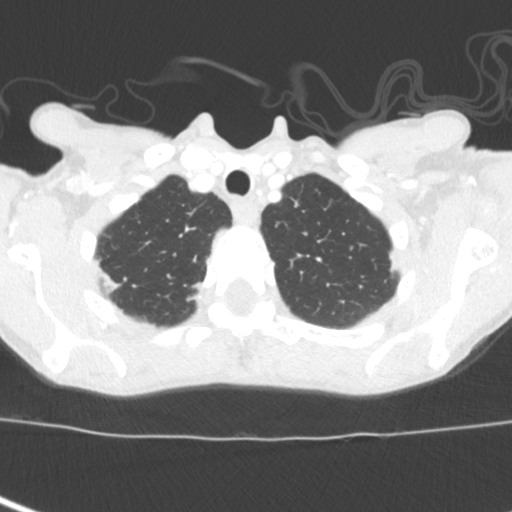
[im 153/160  mediastinal]
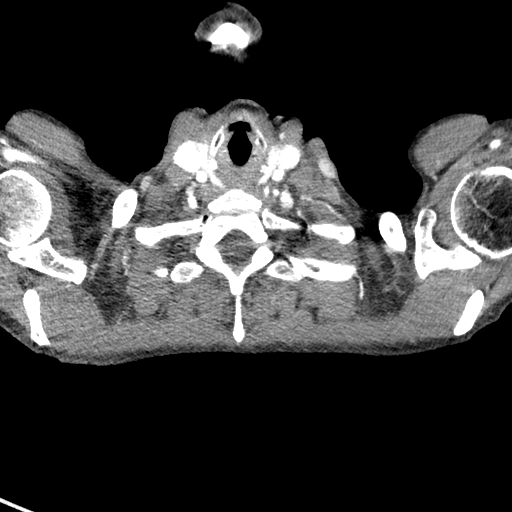
[im 153/160  lung]
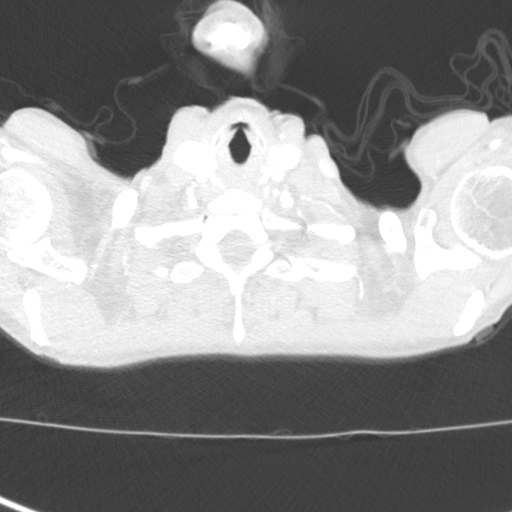

[Series 8: coronal · coronal · 0.63mm/px · 1 of 114 slices shown]
[im 57/114  lung]
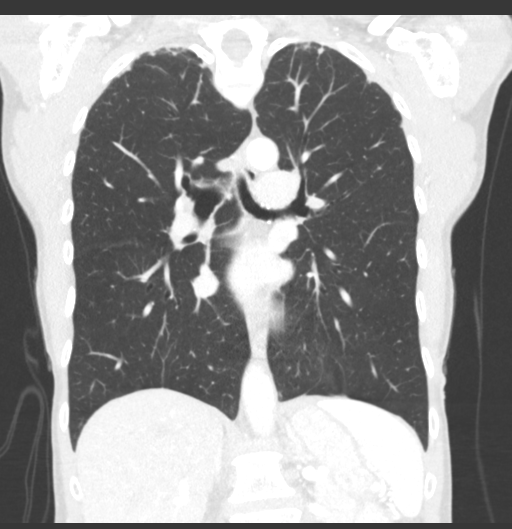

[14 of 36 positions shown; findings below may reference images not displayed]

FINDINGS: Cardiovascular: The heart size is normal. No substantial pericardial
effusion. No thoracic aortic aneurysm.

Mediastinum/Nodes: No mediastinal lymphadenopathy. There is no hilar
lymphadenopathy. The esophagus has normal imaging features. There is
no axillary lymphadenopathy.

Lungs/Pleura: Centrilobular emphsyema noted. Biapical
pleuroparenchymal scarring evident. 6 x 7 mm subpleural posterior
left upper lobe pulmonary nodule identified on image 33/series 7.
No focal airspace consolidation. No pleural effusion.

Upper Abdomen: 10 mm hypervascular lesion noted posterior left liver
on [DATE] [DATE]. 1.6 cm hypervascular lesion noted posterior right
liver on 159/3.

Musculoskeletal: No worrisome lytic or sclerotic osseous
abnormality.
IMPRESSION: 1. 6 x 7 mm subpleural posterior left upper lobe pulmonary nodule.
Non-contrast chest CT at 6-12 months is recommended. If the nodule
is stable at time of repeat CT, then future CT at 18-24 months (from
today's scan) is considered optional for low-risk patients, but is
recommended for high-risk patients. This recommendation follows the
consensus statement: Guidelines for Management of Incidental
Pulmonary Nodules Detected on CT Images: From the [HOSPITAL]
2. Multiple hypervascular liver lesions measuring up to 1.6 cm.
These are not definitely seen on previous abdomen/pelvis CT,
potentially related to bolus timing. MRI abdomen with and without
contrast recommended to further evaluate.
3. Emphysema ([id number]).

These results will be called to the ordering clinician or
representative by the Radiologist Assistant, and communication
documented in the PACS or [REDACTED].

## 2021-01-20 ENCOUNTER — Ambulatory Visit (HOSPITAL_COMMUNITY)
Admission: RE | Admit: 2021-01-20 | Discharge: 2021-01-20 | Disposition: A | Discharge status: Home / Self Care | Payer: Medicare PPO | Source: Ambulatory Visit | Attending: Hematology | Admitting: Hematology

## 2021-01-20 ENCOUNTER — Inpatient Hospital Stay (HOSPITAL_COMMUNITY): Payer: Medicare PPO | Attending: Hematology

## 2021-01-20 ENCOUNTER — Other Ambulatory Visit: Payer: Self-pay

## 2021-01-20 DIAGNOSIS — R634 Abnormal weight loss: Secondary | ICD-10-CM | POA: Insufficient documentation

## 2021-01-20 DIAGNOSIS — D802 Selective deficiency of immunoglobulin A [IgA]: Secondary | ICD-10-CM | POA: Insufficient documentation

## 2021-01-20 DIAGNOSIS — R768 Other specified abnormal immunological findings in serum: Secondary | ICD-10-CM

## 2021-01-20 DIAGNOSIS — Z79899 Other long term (current) drug therapy: Secondary | ICD-10-CM | POA: Insufficient documentation

## 2021-01-20 DIAGNOSIS — R911 Solitary pulmonary nodule: Secondary | ICD-10-CM | POA: Insufficient documentation

## 2021-01-20 DIAGNOSIS — R5383 Other fatigue: Secondary | ICD-10-CM | POA: Diagnosis not present

## 2021-01-20 LAB — COMPREHENSIVE METABOLIC PANEL
ALT: 18 U/L (ref 0–44)
AST: 25 U/L (ref 15–41)
Albumin: 4.3 g/dL (ref 3.5–5.0)
Alkaline Phosphatase: 62 U/L (ref 38–126)
Anion gap: 13 (ref 5–15)
BUN: 21 mg/dL (ref 8–23)
CO2: 24 mmol/L (ref 22–32)
Calcium: 8.5 mg/dL — ABNORMAL LOW (ref 8.9–10.3)
Chloride: 98 mmol/L (ref 98–111)
Creatinine, Ser: 0.81 mg/dL (ref 0.44–1.00)
GFR, Estimated: >60 mL/min (ref >60)
Glucose, Bld: 86 mg/dL (ref 70–99)
Potassium: 4 mmol/L (ref 3.5–5.1)
Sodium: 135 mmol/L (ref 135–145)
Total Bilirubin: 0.7 mg/dL (ref 0.3–1.2)
Total Protein: 6.3 g/dL — ABNORMAL LOW (ref 6.5–8.1)

## 2021-01-20 LAB — CBC WITH DIFFERENTIAL/PLATELET
Abs Immature Granulocytes: 0.01 10*3/uL (ref 0.00–0.07)
Basophils Absolute: 0 10*3/uL (ref 0.0–0.1)
Basophils Relative: 1 %
Eosinophils Absolute: 0.1 10*3/uL (ref 0.0–0.5)
Eosinophils Relative: 2 %
HCT: 40.2 % (ref 36.0–46.0)
Hemoglobin: 13.2 g/dL (ref 12.0–15.0)
Immature Granulocytes: 0 %
Lymphocytes Relative: 30 %
Lymphs Abs: 1.9 10*3/uL (ref 0.7–4.0)
MCH: 30.4 pg (ref 26.0–34.0)
MCHC: 32.8 g/dL (ref 30.0–36.0)
MCV: 92.6 fL (ref 80.0–100.0)
Monocytes Absolute: 0.5 10*3/uL (ref 0.1–1.0)
Monocytes Relative: 8 %
Neutro Abs: 3.7 10*3/uL (ref 1.7–7.7)
Neutrophils Relative %: 59 %
Platelets: 223 10*3/uL (ref 150–400)
RBC: 4.34 MIL/uL (ref 3.87–5.11)
RDW: 13.3 % (ref 11.5–15.5)
WBC: 6.2 10*3/uL (ref 4.0–10.5)
nRBC: 0 % (ref 0.0–0.2)

## 2021-01-21 LAB — IGG, IGA, IGM
IgA: 81 mg/dL — ABNORMAL LOW (ref 87–352)
IgG (Immunoglobin G), Serum: 595 mg/dL (ref 586–1602)
IgM (Immunoglobulin M), Srm: 69 mg/dL (ref 26–217)

## 2021-01-22 ENCOUNTER — Ambulatory Visit (HOSPITAL_COMMUNITY): Payer: BC Managed Care – PPO | Admitting: Hematology

## 2021-01-23 ENCOUNTER — Other Ambulatory Visit: Payer: Self-pay

## 2021-01-23 ENCOUNTER — Inpatient Hospital Stay (HOSPITAL_COMMUNITY): Payer: Medicare PPO | Admitting: Hematology

## 2021-01-23 VITALS — BP 111/58 | HR 76 | Temp 97.0°F | Resp 16 | Wt 109.5 lb

## 2021-01-23 DIAGNOSIS — R911 Solitary pulmonary nodule: Secondary | ICD-10-CM | POA: Diagnosis not present

## 2021-01-23 DIAGNOSIS — R768 Other specified abnormal immunological findings in serum: Secondary | ICD-10-CM

## 2021-01-23 DIAGNOSIS — D802 Selective deficiency of immunoglobulin A [IgA]: Secondary | ICD-10-CM | POA: Diagnosis not present

## 2021-01-23 NOTE — Progress Notes (Signed)
[facility] [place], [place] [address]   CLINIC:  Medical Oncology/Hematology  PCP:  Llano Nation, MD [address] / [place] [address]  [phone]  REASON FOR VISIT:  Follow-up for decreased IgA levels and lung nodule  PRIOR THERAPY: None  CURRENT THERAPY: Observation  INTERVAL HISTORY:  [name], a 65 y.o. female, returns for routine follow-up for her decreased IgA levels and lung nodule. [name] was last seen on 07/24/2020.  Today she reports feeling okay. She denies having any recent infections. She reports that she had another lichen keratosis on her leg biopsied in April 2022; she reports having 5 different squamous cell carcinomas on her skin over the past 2 years.   REVIEW OF SYSTEMS:  Review of Systems  Constitutional: Positive for fatigue (25%). Negative for appetite change.  All other systems reviewed and are negative.   PAST MEDICAL/SURGICAL HISTORY:  Past Medical History:  Diagnosis Date  . GERD (gastroesophageal reflux disease)   . Hyperlipidemia   . Lung nodule    left   . Osteopenia   . Osteoporosis   . Squamous cell skin cancer    Right leg   Past Surgical History:  Procedure Laterality Date  . BIOPSY  11/02/2019   Procedure: BIOPSY;  Surgeon: Daneil Dolin, MD;  Location: [facility];  Service: Endoscopy;;  gastric  . Biopsy right leg Right    skin  . COLONOSCOPY N/A 09/17/2017   Dr. Gala Romney: normal colonoscopy.  Next colonoscopy in 10 years.  . ESOPHAGOGASTRODUODENOSCOPY (EGD) WITH PROPOFOL N/A 11/02/2019   Dr. Gala Romney: Gastric mucosa coated by thick tenacious bile-stained mucus.  Stomach diffusely injected with scattered focal hemorrhagic erosions but no frank ulcer.  Small hiatal hernia.  Gastric biopsy showed nonspecific reactive gastropathy, no H. pylori.  Duodenum appeared normal.  . TONSILLECTOMY      SOCIAL HISTORY:  Social History   Socioeconomic History  . Marital status: Married    Spouse  name: Not on file  . Number of children: 1  . Years of education: Not on file  . Highest education level: Not on file  Occupational History  . Not on file  Tobacco Use  . Smoking status: Never Smoker  . Smokeless tobacco: Never Used  Vaping Use  . Vaping Use: Never used  Substance and Sexual Activity  . Alcohol use: No  . Drug use: No  . Sexual activity: Yes  Other Topics Concern  . Not on file  Social History Narrative  . Not on file   Social Determinants of Health   Financial Resource Strain: Not on file  Food Insecurity: Not on file  Transportation Needs: Not on file  Physical Activity: Not on file  Stress: Not on file  Social Connections: Not on file  Intimate Partner Violence: Not on file    FAMILY HISTORY:  Family History  Problem Relation Age of Onset  . Osteoporosis Mother   . Kidney disease Mother   . Autoimmune disease Mother   . Leukemia Mother   . Parkinson's disease Father   . Dementia Father   . Kidney disease Father   . Stroke Father   . Angina Father   . Skin cancer Father   . Colon cancer Cousin   . Parkinson's disease Maternal Grandfather   . Heart attack Paternal Grandfather   . Cerebral palsy Son     CURRENT MEDICATIONS:  Current Outpatient Medications  Medication Sig Dispense  Refill  . Barberry-Oreg Grape-Goldenseal (BERBERINE COMPLEX PO)     . cycloSPORINE (RESTASIS) 0.05 % ophthalmic emulsion Place 1 drop into both eyes 2 (two) times daily.    Marland Kitchen estradiol (ESTRACE) 0.1 MG/GM vaginal cream Place 1 Applicatorful 2 (two) times a week vaginally.     . Multiple Vitamin (MULTIVITAMIN) tablet Take 1 tablet by mouth daily.    . Multiple Vitamins-Minerals (ALGAE BASED CALCIUM PO) Take by mouth.    . Probiotic Product (PROBIOTIC DAILY PO) Take by mouth.    . Strontium Chloride POWD by Does not apply route. capsules    . valACYclovir (VALTREX) 1000 MG tablet Take 2,000 mg by mouth 2 (two) times daily as needed (fever blisters).     . Vitamin  D-Vitamin K (K2 PLUS D3 PO) Take by mouth. 5000 units    . Wheat Dextrin (BENEFIBER) POWD Take 1 Dose by mouth daily. 2 tbsp     No current facility-administered medications for this visit.    ALLERGIES:  Allergies  Allergen Reactions  . Amoxicillin     Severe fatigue  Did it involve swelling of the face/tongue/throat, SOB, or low BP? No Did it involve sudden or severe rash/hives, skin peeling, or any reaction on the inside of your mouth or nose? No Did you need to seek medical attention at a hospital or doctor's office? No When did it last happen?6 - 7 years If all above answers are "NO", may proceed with cephalosporin use.   . Bactrim [Sulfamethoxazole-Trimethoprim] Hives  . Keflex [Cephalexin] Hives  . Hydrocodone Nausea Only    PHYSICAL EXAM:  Performance status (ECOG): 1 - Symptomatic but completely ambulatory  Vitals:   01/23/21 1524  BP: (!) 111/58  Pulse: 76  Resp: 16  Temp: (!) 97 F (36.1 C)  SpO2: 98%   Wt Readings from Last 3 Encounters:  01/23/21 109 lb 8 oz (49.7 kg)  08/07/20 109 lb 12.8 oz (49.8 kg)  07/24/20 109 lb 12.8 oz (49.8 kg)   Physical Exam Vitals reviewed.  Constitutional:      Appearance: Normal appearance.  Cardiovascular:     Rate and Rhythm: Normal rate and regular rhythm.     Pulses: Normal pulses.     Heart sounds: Normal heart sounds.  Pulmonary:     Effort: Pulmonary effort is normal.     Breath sounds: Normal breath sounds.  Chest:  Breasts:     Right: No axillary adenopathy or supraclavicular adenopathy.     Left: No axillary adenopathy or supraclavicular adenopathy.    Abdominal:     Palpations: Abdomen is soft. There is no hepatomegaly, splenomegaly or mass.     Tenderness: There is no abdominal tenderness.     Hernia: No hernia is present.  Musculoskeletal:     Right lower leg: No edema.     Left lower leg: No edema.  Lymphadenopathy:     Cervical: No cervical adenopathy.     Upper Body:     Right upper  body: No supraclavicular, axillary or pectoral adenopathy.     Left upper body: No supraclavicular, axillary or pectoral adenopathy.     Lower Body: No right inguinal adenopathy. No left inguinal adenopathy.  Neurological:     General: No focal deficit present.     Mental Status: She is alert and oriented to person, place, and time.  Psychiatric:        Mood and Affect: Mood normal.        Behavior: Behavior normal.  LABORATORY DATA:  I have reviewed the labs as listed.  CBC Latest Ref Rng & Units 01/20/2021 07/16/2020 12/19/2019  WBC 4.0 - 10.5 K/uL 6.2 6.5 6.5  Hemoglobin 12.0 - 15.0 g/dL 13.2 12.8 12.9  Hematocrit 36.0 - 46.0 % 40.2 39.2 40.1  Platelets 150 - 400 K/uL 223 238 302   CMP Latest Ref Rng & Units 01/20/2021 07/16/2020 12/19/2019  Glucose 70 - 99 mg/dL 86 89 99  BUN 8 - 23 mg/dL 21 18 22   Creatinine 0.44 - 1.00 mg/dL 0.81 0.77 0.69  Sodium 135 - 145 mmol/L 135 138 138  Potassium 3.5 - 5.1 mmol/L 4.0 3.7 3.7  Chloride 98 - 111 mmol/L 98 100 100  CO2 22 - 32 mmol/L 24 27 30   Calcium 8.9 - 10.3 mg/dL 8.5(L) 8.9 9.1  Total Protein 6.5 - 8.1 g/dL 6.3(L) 6.6 7.0  Total Bilirubin 0.3 - 1.2 mg/dL 0.7 0.7 0.4  Alkaline Phos 38 - 126 U/L 62 74 77  AST 15 - 41 U/L 25 26 32  ALT 0 - 44 U/L 18 22 26       Component Value Date/Time   RBC 4.34 01/20/2021 1439   MCV 92.6 01/20/2021 1439   MCV 92 10/03/2019 1345   MCH 30.4 01/20/2021 1439   MCHC 32.8 01/20/2021 1439   RDW 13.3 01/20/2021 1439   RDW 12.4 10/03/2019 1345   LYMPHSABS 1.9 01/20/2021 1439   LYMPHSABS 1.6 10/03/2019 1345   MONOABS 0.5 01/20/2021 1439   EOSABS 0.1 01/20/2021 1439   EOSABS 0.0 10/03/2019 1345   BASOSABS 0.0 01/20/2021 1439   BASOSABS 0.0 10/03/2019 1345    DIAGNOSTIC IMAGING:  I have independently reviewed the scans and discussed with the patient. CT Chest Wo Contrast  Result Date: 01/21/2021 CLINICAL DATA:  Follow-up pulmonary nodule EXAM: CT CHEST WITHOUT CONTRAST TECHNIQUE:  Multidetector CT imaging of the chest was performed following the standard protocol without IV contrast. COMPARISON:  CT chest dated 07/16/2020 FINDINGS: Cardiovascular: Heart is normal in size.  No pericardial effusion. No evidence of thoracic aortic aneurysm. Mediastinum/Nodes: No suspicious mediastinal lymphadenopathy. Visualized thyroid is unremarkable. Lungs/Pleura: Biapical pleural-parenchymal scarring. Previously measured nodule in the posterior left upper lobe measures 6 x 7 mm (series 4/image 38) and corresponds to pleural-parenchymal scarring, benign. No suspicious pulmonary nodules. No focal consolidation. No pleural effusion or pneumothorax. Upper Abdomen: Visualized upper abdomen is grossly unremarkable, noting vascular calcifications. Musculoskeletal: Mild degenerative changes of the mid thoracic spine. IMPRESSION: Benign pleural parenchymal scarring in the posterior left upper lobe, benign. No suspicious pulmonary nodules. Dedicated follow-up imaging is not required. Electronically Signed   By: Julian Hy M.D.   On: 01/21/2021 09:21     ASSESSMENT:  1. Mild IgA deficiency: -She was evaluated for low IgA levels of 72. -Repeat immunoglobulin levels show IgA of 76. IgG was normal. She does not have any recurrent infections including sinopulmonary infections. -No active intervention necessary.  2.  Left upper lobe pulmonary nodule: -CT chest on 07/16/2020 shows stable 8 x 5 mm subpleural left upper lobe pulmonary nodule with no mediastinal or hilar adenopathy.  Stable biapical pleural and parenchymal scarring type changes. -She is non-smoker.   PLAN:  1. Mild IgA deficiency: -She has history of mild IgA deficiency. - Does not report any recurrent infections. - We reviewed labs from 01/19/2021 which showed IgA level of 81.  IgG was normal.  Other chemistries and CBC were normal. - Recommend follow-up in 1 year.  2. Left upper quadrant  pain and weight loss: -Left upper  quadrant pain improved after she started gluten-free diet.  Weight loss stabilized. - MRI of the abdomen on 02/22/2020 showed cavernous hemangioma in segment 4A.  3.  Left upper lobe lung nodule: -We reviewed results of CT chest without contrast from 01/20/2021 which showed benign pleural-parenchymal scarring in the posterior left upper lobe, benign.  No suspicious lung nodules. - She is a non-smoker.  Hence no further imaging is required.  Orders placed this encounter:  Orders Placed This Encounter  Procedures  . CBC with Differential/Platelet  . IgG, IgA, IgM     Derek Jack, MD [facility] [phone]   I, Milinda Antis, am acting as a scribe for Dr. Sanda Linger.  I, Derek Jack MD, have reviewed the above documentation for accuracy and completeness, and I agree with the above.

## 2021-01-23 NOTE — Patient Instructions (Signed)
[place] at [facility] Discharge Instructions  You were seen today by Dr. Delton Coombes. He went over your recent results and scans. You do not need any more scans for your lung nodule, unless you develop additional associated symptoms. Dr. Delton Coombes will see you back in 1 year for labs and follow up.   Thank you for choosing [place] at [facility] to provide your oncology and hematology care.  To afford each patient quality time with our provider, please arrive at least 15 minutes before your scheduled appointment time.   If you have a lab appointment with the [facility] please come in thru the Main Entrance and check in at the main information desk  You need to re-schedule your appointment should you arrive 10 or more minutes late.  We strive to give you quality time with our providers, and arriving late affects you and other patients whose appointments are after yours.  Also, if you no show three or more times for appointments you may be dismissed from the clinic at the providers discretion.     Again, thank you for choosing [facility].  Our hope is that these requests will decrease the amount of time that you wait before being seen by our physicians.       _____________________________________________________________  Should you have questions after your visit to [facility], please contact our office at [phone] between the hours of 8:00 a.m. and 4:30 p.m.  Voicemails left after 4:00 p.m. will not be returned until the following business day.  For prescription refill requests, have your pharmacy contact our office and allow 72 hours.    Cancer Center Support Programs:   > Cancer Support Group  2nd Tuesday of the month 1pm-2pm, Journey Room

## 2021-02-11 ENCOUNTER — Telehealth: Payer: Medicare PPO | Admitting: Urology

## 2021-02-11 ENCOUNTER — Telehealth: Payer: Self-pay | Admitting: Internal Medicine

## 2021-02-11 NOTE — Telephone Encounter (Signed)
Spoke to pt.  She said that she is having abd bloating and pain near belly button.  She said that her belly feels firm.  No n/v or fever.  Pain doesn't seem constant.  Feels like she is not emptying out completely.  Bloating.  Takes benefiber as recommended by [place] in the past.  Informed pt to contact PCP since we are booked out until Sept/Oct.  Pt voiced understanding and said she would but requested that I send a note to Sally Crouch, PA.  She said that she would like her recommendations/thoughts on this as well.   Says [name] has helped her a bunch and is wonderful at trouble shooting.  Routing as requested.  [name] has left for today but will send for her to review tomorrow.

## 2021-02-11 NOTE — Telephone Encounter (Signed)
Pt LMOM that she is wanting to see LSL in the next week or so. We have nothing before mid October. Please advise patient. [phone]

## 2021-02-13 ENCOUNTER — Ambulatory Visit: Payer: BC Managed Care – PPO | Admitting: Urology

## 2021-02-17 NOTE — Telephone Encounter (Signed)
Hernias are usually addressed if it is causing pain at the site. If she is required to do any heavy lifting, such as lifting her son, hernia can get bigger. If she starts having pain at site of hernia, she should see general surgeon.

## 2021-02-17 NOTE — Telephone Encounter (Signed)
Called pt and informed her of the information given by Neil Crouch, PA.  Pt voiced understanding.

## 2021-02-17 NOTE — Telephone Encounter (Addendum)
Spoke to [name].  She went to see PCP (Dr. Jimmye Norman) on Thurs.  She said that he was not real concerned.  She said that she has a small hernia.  She said that PCP confirmed.  She said he told her nothing to be concerned about.  Said X-ray showed gas.  He offered medicine to help with it but [name] said that she declined.  She said that she is cautious with meds.  She did not want to start any new medicines as recommended by [place] either.  She said that she stopped Berberine on Friday and wants to see if that helps.  Denies epigastric pain.  [name] says she can feel hernia bulge to the right of her belly button.    Neil Crouch, PA:  Pt would like to know when she should be concerned about hernia since she cares for her disabled son.  She wants to know if any restrictions or limitations.  When should she seek medical treatment?

## 2021-02-17 NOTE — Telephone Encounter (Signed)
I last saw the patient in March 2021.  Since seen Dr. Gala Romney (April 2021) and Dr. Jerene Pitch (August 2021) at [facility].  Their notes reviewed.  By Dr. Jerene Pitch for second opinion, requested by [place], for ongoing upper quadrant pain and weight loss.  Symptoms felt to be related to dyspepsia.  At the time she saw Dr. Jerene Pitch, she was improved after starting a gluten-free diet.  No diagnosis of celiac disease, prior TTG IgA and TTG IgG levels were normal.  Her serum IgA level was mildly low but her serum IgG level was normal.  She also has a history of 1.2 x 0.8 cm liver lesion seen on prior MRI in May 2021 suspected hepatic hemangioma and requires no further imaging.  1. Agree with advice for patient to see PCP for any urgent needs. 2. We can add her to our cancellation list also.  If she does not feel like she is emptying her bowels adequately, we can add to her bowel regimen.  Options include prescription medications such as low-dose Linzess, Amitiza, Trulance or over-the-counter MiraLAX. 3. If she is having any epigastric pain/left upper quadrant pain, reflux type symptoms, we could add back omeprazole short-term.

## 2021-02-17 NOTE — Telephone Encounter (Signed)
Lmom for pt to call me back. 

## 2021-03-06 ENCOUNTER — Other Ambulatory Visit: Payer: Self-pay

## 2021-03-06 ENCOUNTER — Encounter: Payer: Self-pay | Admitting: General Surgery

## 2021-03-06 ENCOUNTER — Ambulatory Visit (INDEPENDENT_AMBULATORY_CARE_PROVIDER_SITE_OTHER): Payer: Medicare PPO | Admitting: General Surgery

## 2021-03-06 VITALS — BP 125/81 | HR 80 | Temp 98.2°F | Resp 12 | Ht 66.0 in | Wt 106.0 lb

## 2021-03-06 DIAGNOSIS — K429 Umbilical hernia without obstruction or gangrene: Secondary | ICD-10-CM

## 2021-03-06 NOTE — Progress Notes (Signed)
[facility] History and Physical  Reason for Referral: Umbilical hernia  Referring Physician: Self referral  Chief Complaint   New Patient (Initial Visit); Hernia     Jaleya A Tine is a 65 y.o. female.  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 She says that she had a full workup with Gi and only a gluten free diet really helped her. A lot of these issues are better but she has started to feel a bulge and discomfort at the umbilical area and is worried about a hernia. She has a 96 yo son with CP that she cares for. She has a lift at home but has to roll him and push his wheelchair etc.   She denies any issues with it getting stuck out and hard. She says that she has noticed it for a few weeks now and the discomfort is localized and worse with pressure on the area.  Past Medical History:  Diagnosis Date   GERD (gastroesophageal reflux disease)    Hyperlipidemia    Lung nodule    left    Osteopenia    Osteoporosis    Squamous cell skin cancer    Right leg    Past Surgical History:  Procedure Laterality Date   BIOPSY  11/02/2019   Procedure: BIOPSY;  Surgeon: Daneil Dolin, MD;  [facility]: [facility];  Service: Endoscopy;;  gastric   Biopsy right leg Right    skin   COLONOSCOPY N/A 09/17/2017   Dr. Gala Romney: normal colonoscopy.  Next colonoscopy in 10 years.   ESOPHAGOGASTRODUODENOSCOPY (EGD) WITH PROPOFOL N/A 11/02/2019   Dr. Gala Romney: Gastric mucosa coated by thick tenacious bile-stained mucus.  Stomach diffusely injected with scattered focal hemorrhagic erosions but no frank ulcer.  Small hiatal hernia.  Gastric biopsy showed nonspecific reactive gastropathy, no H. pylori.  Duodenum appeared normal.   TONSILLECTOMY      Family History  Problem Relation Age of Onset   Osteoporosis Mother    Kidney disease Mother    Autoimmune disease Mother    Leukemia Mother     Parkinson's disease Father    Dementia Father    Kidney disease Father    Stroke Father    Angina Father    Skin cancer Father    Colon cancer Cousin    Parkinson's disease Maternal Grandfather    Heart attack Paternal Grandfather    Cerebral palsy Son     Social History   Tobacco Use   Smoking status: Never   Smokeless tobacco: Never  Vaping Use   Vaping Use: Never used  Substance Use Topics   Alcohol use: No   Drug use: No    Medications: I have reviewed the patient's current medications. Allergies as of 03/06/2021       Reactions   Amoxicillin    Severe fatigue  Did it involve swelling of the face/tongue/throat, SOB, or low BP? No Did it involve sudden or severe rash/hives, skin peeling, or any reaction on the inside of your mouth or nose? No Did you need to seek medical attention at a hospital or doctor's office? No When did it last happen?      6 - 7 years If all above answers are "NO", may proceed with cephalosporin use.   Bactrim [sulfamethoxazole-trimethoprim] Hives   Keflex [cephalexin] Hives   Hydrocodone Nausea Only  Medication List        Accurate as of March 06, 2021  1:14 PM. If you have any questions, ask your nurse or doctor.          ALGAE BASED CALCIUM PO Take by mouth.   Benefiber Powd Take 1 Dose by mouth daily. 2 tbsp   BERBERINE COMPLEX PO   cycloSPORINE 0.05 % ophthalmic emulsion Commonly known as: RESTASIS Place 1 drop into both eyes 2 (two) times daily.   estradiol 0.1 MG/GM vaginal cream Commonly known as: ESTRACE Place 1 Applicatorful 2 (two) times a week vaginally.   K2 PLUS D3 PO Take by mouth. 5000 units   multivitamin tablet Take 1 tablet by mouth daily.   PROBIOTIC DAILY PO Take by mouth.   Strontium Chloride Powd by Does not apply route. capsules   valACYclovir 1000 MG tablet Commonly known as: VALTREX Take 2,000 mg by mouth 2 (two) times daily as needed (fever blisters).         ROS:  A  comprehensive review of systems was negative except for: Gastrointestinal: positive for abdominal pain and bloating Musculoskeletal: positive for back pain, neck pain, and joint pain  Blood pressure 125/81, pulse 80, temperature 98.2 F (36.8 C), temperature source Other (Comment), resp. rate 12, height 5\' 6"  (1.676 m), weight 106 lb (48.1 kg), SpO2 97 %. Physical Exam Vitals reviewed.  Constitutional:      Appearance: Normal appearance.  HENT:     Head: Normocephalic.     Nose: Nose normal.     Mouth/Throat:     Mouth: Mucous membranes are moist.  Eyes:     Extraocular Movements: Extraocular movements intact.  Cardiovascular:     Rate and Rhythm: Normal rate and regular rhythm.  Pulmonary:     Effort: Pulmonary effort is normal.     Breath sounds: Normal breath sounds.  Abdominal:     General: There is no distension.     Palpations: Abdomen is soft.     Tenderness: There is no abdominal tenderness.     Hernia: A hernia is present.     Comments: Umbilical hernia, < 2.0NO defect tender  Musculoskeletal:        General: Normal range of motion.     Cervical back: Normal range of motion.     Left lower leg: No edema.     Comments: Minor leg edema   Skin:    General: Skin is warm.  Neurological:     General: No focal deficit present.     Mental Status: She is alert and oriented to person, place, and time.  Psychiatric:        Mood and Affect: Mood normal.        Behavior: Behavior normal.        Thought Content: Thought content normal.        Judgment: Judgment normal.    Results: None   Assessment & Plan:  DAYANE HILLENBURG is a 65 y.o. female with a small umbilical hernia. She is contemplating repair but is worried about her caregiving needs for her son. Discussed repair and use of sutures only given that it is so small and mesh would not be able to be introduced. Discussed risk of bleeding, infection, recurrence, and post operative restrictions.   Knows reasons to go to  ED Will call when she decides what she prefers to do   All questions were answered to the satisfaction of the patient.  Virl Cagey 03/06/2021,  1:14 PM

## 2021-03-06 NOTE — Patient Instructions (Signed)
Your hernia defect is about 0.5cm in size. Would likely only do sutures that are permanent to repair.

## 2021-04-10 DIAGNOSIS — Z85828 Personal history of other malignant neoplasm of skin: Secondary | ICD-10-CM | POA: Diagnosis not present

## 2021-04-10 DIAGNOSIS — L438 Other lichen planus: Secondary | ICD-10-CM | POA: Diagnosis not present

## 2021-04-11 ENCOUNTER — Ambulatory Visit: Payer: Medicare PPO | Admitting: Urology

## 2021-04-11 ENCOUNTER — Other Ambulatory Visit: Payer: Self-pay

## 2021-04-11 ENCOUNTER — Encounter: Payer: Self-pay | Admitting: Urology

## 2021-04-11 VITALS — BP 126/87 | HR 76 | Temp 99.0°F

## 2021-04-11 DIAGNOSIS — R3129 Other microscopic hematuria: Secondary | ICD-10-CM

## 2021-04-11 LAB — URINALYSIS, ROUTINE W REFLEX MICROSCOPIC
Bilirubin, UA: NEGATIVE
Glucose, UA: NEGATIVE
Ketones, UA: NEGATIVE
Leukocytes,UA: NEGATIVE
Nitrite, UA: NEGATIVE
Protein,UA: NEGATIVE
Specific Gravity, UA: 1.01 (ref 1.005–1.030)
Urobilinogen, Ur: 0.2 mg/dL (ref 0.2–1.0)
pH, UA: 7.5 (ref 5.0–7.5)

## 2021-04-11 LAB — MICROSCOPIC EXAMINATION: Renal Epithel, UA: NONE SEEN /hpf

## 2021-04-11 NOTE — Patient Instructions (Signed)

## 2021-04-11 NOTE — Progress Notes (Signed)
Urological Symptom Review  Patient is experiencing the following symptoms: Frequent urination Get up at night to urinate   Review of Systems  Gastrointestinal (upper)  : Bloating  Gastrointestinal (lower) : Negative for lower GI symptoms  Constitutional : Weight loss  Skin: Negative for skin symptoms  Eyes: Negative for eye symptoms  Ear/Nose/Throat : Negative for Ear/Nose/Throat symptoms  Hematologic/Lymphatic: Negative for Hematologic/Lymphatic symptoms  Cardiovascular : Negative for cardiovascular symptoms  Respiratory : Negative for respiratory symptoms  Endocrine: Negative for endocrine symptoms  Musculoskeletal: Back pain Joint pain  Neurological: Negative for neurological symptoms  Psychologic: Negative for psychiatric symptoms

## 2021-04-11 NOTE — Progress Notes (Signed)
04/11/2021 2:44 PM   [name] 30-Aug-1956 [id number]  Referring provider: Almont Nation, MD [place],  [place] [address]  Followup microhematuria   HPI: [name] is a 65yo here for followup for microhematuria. UA today shows no RBCs. No worsening LUTS. No gross hematuria. No other complaints.    PMH: Past Medical History:  Diagnosis Date   GERD (gastroesophageal reflux disease)    Hyperlipidemia    Lung nodule    left    Osteopenia    Osteoporosis    Squamous cell skin cancer    Right leg    Surgical History: Past Surgical History:  Procedure Laterality Date   BIOPSY  11/02/2019   Procedure: BIOPSY;  Surgeon: Daneil Dolin, MD;  [facility];  Service: Endoscopy;;  gastric   Biopsy right leg Right    skin   COLONOSCOPY N/A 09/17/2017   Dr. Gala Romney: normal colonoscopy.  Next colonoscopy in 10 years.   ESOPHAGOGASTRODUODENOSCOPY (EGD) WITH PROPOFOL N/A 11/02/2019   Dr. Gala Romney: Gastric mucosa coated by thick tenacious bile-stained mucus.  Stomach diffusely injected with scattered focal hemorrhagic erosions but no frank ulcer.  Small hiatal hernia.  Gastric biopsy showed nonspecific reactive gastropathy, no H. pylori.  Duodenum appeared normal.   TONSILLECTOMY      Home Medications:  Allergies as of 04/11/2021       Reactions   Amoxicillin    Severe fatigue  Did it involve swelling of the face/tongue/throat, SOB, or low BP? No Did it involve sudden or severe rash/hives, skin peeling, or any reaction on the inside of your mouth or nose? No Did you need to seek medical attention at a hospital or doctor's office? No When did it last happen?      6 - 7 years If all above answers are "NO", may proceed with cephalosporin use.   Bactrim [sulfamethoxazole-trimethoprim] Hives   Keflex [cephalexin] Hives   Hydrocodone Nausea Only        Medication List        Accurate as of April 11, 2021  2:44 PM. If you have any questions, ask your nurse or  doctor.          ALGAE BASED CALCIUM PO Take by mouth.   Benefiber Powd Take 1 Dose by mouth daily. 2 tbsp   BERBERINE COMPLEX PO   cycloSPORINE 0.05 % ophthalmic emulsion Commonly known as: RESTASIS Place 1 drop into both eyes 2 (two) times daily.   estradiol 0.1 MG/GM vaginal cream Commonly known as: ESTRACE Place 1 Applicatorful 2 (two) times a week vaginally.   K2 PLUS D3 PO Take by mouth. 5000 units   multivitamin tablet Take 1 tablet by mouth daily.   PROBIOTIC DAILY PO Take by mouth.   Strontium Chloride Powd by Does not apply route. capsules   valACYclovir 1000 MG tablet Commonly known as: VALTREX Take 2,000 mg by mouth 2 (two) times daily as needed (fever blisters).        Allergies:  Allergies  Allergen Reactions   Amoxicillin     Severe fatigue  Did it involve swelling of the face/tongue/throat, SOB, or low BP? No Did it involve sudden or severe rash/hives, skin peeling, or any reaction on the inside of your mouth or nose? No Did you need to seek medical attention at a hospital or doctor's office? No When did it last happen?      6 - 7 years If all above answers are "  NO", may proceed with cephalosporin use.    Bactrim [Sulfamethoxazole-Trimethoprim] Hives   Keflex [Cephalexin] Hives   Hydrocodone Nausea Only    Family History: Family History  Problem Relation Age of Onset   Osteoporosis Mother    Kidney disease Mother    Autoimmune disease Mother    Leukemia Mother    Parkinson's disease Father    Dementia Father    Kidney disease Father    Stroke Father    Angina Father    Skin cancer Father    Colon cancer Cousin    Parkinson's disease Maternal Grandfather    Heart attack Paternal Grandfather    Cerebral palsy Son     Social History:  reports that she has never smoked. She has never used smokeless tobacco. She reports that she does not drink alcohol and does not use drugs.  ROS: All other review of systems were reviewed and  are negative except what is noted above in HPI  Physical Exam: BP 126/87   Pulse 76   Temp 99 F (37.2 C)   Constitutional:  Alert and oriented, No acute distress. HEENT: [place] AT, moist mucus membranes.  Trachea midline, no masses. Cardiovascular: No clubbing, cyanosis, or edema. Respiratory: Normal respiratory effort, no increased work of breathing. GI: Abdomen is soft, nontender, nondistended, no abdominal masses GU: No CVA tenderness.  Lymph: No cervical or inguinal lymphadenopathy. Skin: No rashes, bruises or suspicious lesions. Neurologic: Grossly intact, no focal deficits, moving all 4 extremities. Psychiatric: Normal mood and affect.  Laboratory Data: Lab Results  Component Value Date   WBC 6.2 01/20/2021   HGB 13.2 01/20/2021   HCT 40.2 01/20/2021   MCV 92.6 01/20/2021   PLT 223 01/20/2021    Lab Results  Component Value Date   CREATININE 0.81 01/20/2021    No results found for: PSA  No results found for: TESTOSTERONE  Lab Results  Component Value Date   HGBA1C 5.7 (H) 11/28/2019    Urinalysis    Component Value Date/Time   APPEARANCEUR Clear 08/07/2020 1407   GLUCOSEU Negative 08/07/2020 1407   BILIRUBINUR Negative 08/07/2020 1407   PROTEINUR Negative 08/07/2020 1407   NITRITE Negative 08/07/2020 1407   LEUKOCYTESUR Negative 08/07/2020 1407    Lab Results  Component Value Date   LABMICR See below: 08/07/2020   WBCUA 0-5 08/07/2020   LABEPIT 0-10 08/07/2020   MUCUS Present 10/31/2019   BACTERIA Few 08/07/2020    Pertinent Imaging:  No results found for this or any previous visit.  No results found for this or any previous visit.  No results found for this or any previous visit.  No results found for this or any previous visit.  No results found for this or any previous visit.  No results found for this or any previous visit.  No results found for this or any previous visit.  No results found for this or any previous  visit.   Assessment & Plan:    1. Microhematuria -RTC 1 year with UA - Urinalysis, Routine w reflex microscopic   No follow-ups on file.  Nicolette Bang, MD  [facility] [place]

## 2021-06-05 DIAGNOSIS — Q782 Osteopetrosis: Secondary | ICD-10-CM | POA: Diagnosis not present

## 2021-06-05 DIAGNOSIS — R799 Abnormal finding of blood chemistry, unspecified: Secondary | ICD-10-CM | POA: Diagnosis not present

## 2021-06-05 DIAGNOSIS — E639 Nutritional deficiency, unspecified: Secondary | ICD-10-CM | POA: Diagnosis not present

## 2021-06-05 DIAGNOSIS — R7303 Prediabetes: Secondary | ICD-10-CM | POA: Diagnosis not present

## 2021-06-05 DIAGNOSIS — E559 Vitamin D deficiency, unspecified: Secondary | ICD-10-CM | POA: Diagnosis not present

## 2021-06-05 DIAGNOSIS — R7301 Impaired fasting glucose: Secondary | ICD-10-CM | POA: Diagnosis not present

## 2021-06-24 ENCOUNTER — Other Ambulatory Visit: Payer: Self-pay

## 2021-06-24 ENCOUNTER — Ambulatory Visit: Payer: Medicare PPO | Admitting: Internal Medicine

## 2021-06-24 ENCOUNTER — Telehealth: Payer: Self-pay

## 2021-06-24 ENCOUNTER — Encounter: Payer: Self-pay | Admitting: Internal Medicine

## 2021-06-24 VITALS — BP 133/76 | HR 86 | Temp 97.3°F | Ht 66.0 in | Wt 104.4 lb

## 2021-06-24 DIAGNOSIS — R109 Unspecified abdominal pain: Secondary | ICD-10-CM

## 2021-06-24 DIAGNOSIS — R634 Abnormal weight loss: Secondary | ICD-10-CM | POA: Diagnosis not present

## 2021-06-24 DIAGNOSIS — R1013 Epigastric pain: Secondary | ICD-10-CM

## 2021-06-24 DIAGNOSIS — K5909 Other constipation: Secondary | ICD-10-CM | POA: Diagnosis not present

## 2021-06-24 DIAGNOSIS — R140 Abdominal distension (gaseous): Secondary | ICD-10-CM

## 2021-06-24 MED ORDER — AMITRIPTYLINE HCL 10 MG PO TABS: 10.0000 mg | ORAL_TABLET | Freq: Every day | ORAL | 3 refills | Status: DC

## 2021-06-24 NOTE — Progress Notes (Signed)
b

## 2021-06-24 NOTE — Telephone Encounter (Signed)
PA for CT abd/pelvis w/contrast submitted via [employer] website. Procedure tracking# [id number].

## 2021-06-24 NOTE — Progress Notes (Signed)
error 

## 2021-06-24 NOTE — Patient Instructions (Signed)
It was good to see you again today!  As discussed, we will start amitriptyline 10 mg at bedtime (dispense 30 with 3 refills) this is not for depression but it is to help calm your bloating symptoms down.  We talked about the side effects but you should do well on this low dose.  It will take 1 to 2 months to fully see the effects  Stop Benefiber  Use MiraLAX 17 g orally at bedtime every night; discontinue only if diarrhea.  CT of the abdomen and pelvis to further evaluate epigastric pain and weight loss (IV and oral contrast)  Please communicate with me in about 6 weeks and let me know how you are doing  I will be in touch with you once we get the CT scan results.  Office visit here in 3 months

## 2021-06-24 NOTE — Progress Notes (Signed)
Primary Care Physician:  Verdon Nation, MD Primary Gastroenterologist:  Dr. Gala Romney  Pre-Procedure History & Physical: HPI:  [name] is a 65 y.o. female here for further evaluation of bloating and weight loss.  Patient comes with a list of concerns that we reviewed in detail.  Complains of bloating periumbilical pain.  Feels something moving around in her abdomen.  Sleeps poorly.  CT previously did not show anything to explain her symptoms.  Angulated SMA but patent. Requested a second opinion for her symptoms previously actually; saw Dr. Jerene Pitch at [facility] last year.  He had no additional thoughts.  She has weaned off her PPI.  Weight is stable to 105 pounds.  She does not vomit.  She has not had any melena or rectal or bleeding. She has actually seen Dr. Blake Divine for a tiny umbilical hernia.  It is felt this could be repaired with suture only since it is small.  Patient concerned that the downtime postop would impede her ability to take care of her son with CP for which she provides 24/7 care.  As stated previously, although she does not have evidence of celiac disease, she perceives she has gluten sensitivity and avoids gluten.  This seems to have helped her to some degree but she does have prominent ongoing symptoms of primarily bloating.  It is notable that she had a fair amount of stool in her colon on prior CT imaging.  Her bowel regimen consist of Benefiber daily.  Past Medical History:  Diagnosis Date   GERD (gastroesophageal reflux disease)    Hyperlipidemia    Lung nodule    left    Osteopenia    Osteoporosis    Squamous cell skin cancer    Right leg    Past Surgical History:  Procedure Laterality Date   BIOPSY  11/02/2019   Procedure: BIOPSY;  Surgeon: Daneil Dolin, MD;  [facility];  Service: Endoscopy;;  gastric   Biopsy right leg Right    skin   COLONOSCOPY N/A 09/17/2017   Dr. Gala Romney: normal colonoscopy.  Next colonoscopy in 10 years.    ESOPHAGOGASTRODUODENOSCOPY (EGD) WITH PROPOFOL N/A 11/02/2019   Dr. Gala Romney: Gastric mucosa coated by thick tenacious bile-stained mucus.  Stomach diffusely injected with scattered focal hemorrhagic erosions but no frank ulcer.  Small hiatal hernia.  Gastric biopsy showed nonspecific reactive gastropathy, no H. pylori.  Duodenum appeared normal.   TONSILLECTOMY      Prior to Admission medications   Medication Sig Start Date End Date Taking? Authorizing Provider  Ascorbic Acid (VITA-C PO) Take by mouth daily.   Yes [provider]  Barberry-Oreg Grape-Goldenseal (BERBERINE COMPLEX PO)  06/09/21  Yes [provider]  COLLAGEN PO Take by mouth daily.   Yes [provider]  cycloSPORINE (RESTASIS) 0.05 % ophthalmic emulsion Place 1 drop into both eyes 2 (two) times daily.   Yes [provider]  Digestive Enzymes (DIGESTIVE ENZYME PO) Take by mouth daily.   Yes [provider]  estradiol (ESTRACE) 0.1 MG/GM vaginal cream Place 1 Applicatorful 2 (two) times a week vaginally.    Yes [provider]  Multiple Vitamins-Minerals (ALGAE BASED CALCIUM PO) Take by mouth.   Yes [provider]  Strontium Chloride POWD by Does not apply route. capsules   Yes [provider]  valACYclovir (VALTREX) 1000 MG tablet Take 2,000 mg by mouth 2 (two) times daily as needed (fever blisters).    Yes [provider]  Wheat Dextrin (BENEFIBER) POWD Take 1 Dose by mouth daily. 2 tbsp   Yes [provider]    Allergies as of 06/24/2021 - Review Complete 06/24/2021  Allergen Reaction Noted   Amoxicillin  08/12/2017   Bactrim [sulfamethoxazole-trimethoprim] Hives 08/12/2017   Keflex [cephalexin] Hives 08/12/2017   Hydrocodone Nausea Only 08/01/2018    Family History  Problem Relation Age of Onset   Osteoporosis Mother    Kidney disease Mother    Autoimmune disease Mother    Leukemia Mother    Parkinson's disease Father    Dementia  Father    Kidney disease Father    Stroke Father    Angina Father    Skin cancer Father    Colon cancer Cousin    Parkinson's disease Maternal Grandfather    Heart attack Paternal Grandfather    Cerebral palsy Son     Social History   Socioeconomic History   Marital status: Married    Spouse name: Not on file   Number of children: 1   Years of education: Not on file   Highest education level: Not on file  Occupational History   Not on file  Tobacco Use   Smoking status: Never   Smokeless tobacco: Never  Vaping Use   Vaping Use: Never used  Substance and Sexual Activity   Alcohol use: No   Drug use: No   Sexual activity: Yes  Other Topics Concern   Not on file  Social History Narrative   Not on file   Social Determinants of Health   Financial Resource Strain: Not on file  Food Insecurity: Not on file  Transportation Needs: Not on file  Physical Activity: Not on file  Stress: Not on file  Social Connections: Not on file  Intimate Partner Violence: Not on file    Review of Systems: See HPI, otherwise negative ROS  Physical Exam: BP 133/76   Pulse 86   Temp (!) 97.3 F (36.3 C)   Ht 5\' 6"  (1.676 m)   Wt 104 lb 6.4 oz (47.4 kg)   BMI 16.85 kg/m  General:   Alert, somewhat anxious pleasant and cooperative in NAD Neck:  Supple; no masses or thyromegaly. No significant cervical adenopathy. Lungs:  Clear throughout to auscultation.   No wheezes, crackles, or rhonchi. No acute distress. Heart:  Regular rate and rhythm; no murmurs, clicks, rubs,  or gallops. Abdomen: Non-distended, normal bowel sounds.  Soft and nontender without appreciable mass or hepatosplenomegaly.  Pulses:  Normal pulses noted. Extremities:  Without clubbing or edema.  Impression/Plan: 65 year old lady with chronic GI symptoms including prominent symptoms of bloating; likely an element of constipation in the setting of weight loss and significant free-floating anxiety.  CT a year and a half  ago, tertiary referral for second opinion and surgery evaluation all reassuring.  I doubt her tiny umbilical hernia has much to do with her symptoms. She does need better management of her constipation. I feel that she would benefit from adding an agent with would modulate her afferent nocioreceptive pathways. I Make note that she denies glaucoma and urinary retention. She has preserver rated during our visit today of getting another CT.  She wants to make sure nothing is developed since her prior study.  We had a lengthy discussion about this.  Recommendations:  As discussed, we will start amitriptyline 10 mg at bedtime   We talked about the side effects  It will take 1 to 2 months to  fully see the effects; gradual dose escalation may be needed.  Stop Benefiber  Use MiraLAX 17 g orally at bedtime every night; discontinue only if diarrhea.  CT of the abdomen and pelvis to further evaluate epigastric pain and weight loss (IV and oral contrast).  Yield of the study may be low but that will help [place] wrap up her evaluation.  Office visit here in 3 months   Notice: This dictation was prepared with Dragon dictation along with smaller phrase technology. Any transcriptional errors that result from this process are unintentional and may not be corrected upon review.

## 2021-06-25 NOTE — Telephone Encounter (Signed)
CT abd/pelvis approved. [employer]# [id number], valid 07/18/21-08/17/21.

## 2021-06-26 DIAGNOSIS — Z23 Encounter for immunization: Secondary | ICD-10-CM | POA: Diagnosis not present

## 2021-06-26 DIAGNOSIS — Z681 Body mass index (BMI) 19 or less, adult: Secondary | ICD-10-CM | POA: Diagnosis not present

## 2021-06-26 DIAGNOSIS — Z0000 Encounter for general adult medical examination without abnormal findings: Secondary | ICD-10-CM | POA: Diagnosis not present

## 2021-06-26 DIAGNOSIS — R319 Hematuria, unspecified: Secondary | ICD-10-CM | POA: Diagnosis not present

## 2021-06-26 DIAGNOSIS — R634 Abnormal weight loss: Secondary | ICD-10-CM | POA: Diagnosis not present

## 2021-06-26 DIAGNOSIS — R7303 Prediabetes: Secondary | ICD-10-CM | POA: Diagnosis not present

## 2021-06-26 DIAGNOSIS — E785 Hyperlipidemia, unspecified: Secondary | ICD-10-CM | POA: Diagnosis not present

## 2021-06-26 DIAGNOSIS — R911 Solitary pulmonary nodule: Secondary | ICD-10-CM | POA: Diagnosis not present

## 2021-07-18 ENCOUNTER — Ambulatory Visit (HOSPITAL_COMMUNITY): Payer: Medicare PPO

## 2021-07-21 DIAGNOSIS — R1013 Epigastric pain: Secondary | ICD-10-CM | POA: Diagnosis not present

## 2021-07-21 DIAGNOSIS — R634 Abnormal weight loss: Secondary | ICD-10-CM | POA: Diagnosis not present

## 2021-07-22 LAB — CREATININE, SERUM: Creat: 0.75 mg/dL (ref 0.50–1.05)

## 2021-07-23 ENCOUNTER — Other Ambulatory Visit: Payer: Self-pay

## 2021-07-23 ENCOUNTER — Encounter (HOSPITAL_COMMUNITY): Payer: Self-pay | Admitting: Radiology

## 2021-07-23 ENCOUNTER — Ambulatory Visit (HOSPITAL_COMMUNITY)
Admission: RE | Admit: 2021-07-23 | Discharge: 2021-07-23 | Disposition: A | Discharge status: Home / Self Care | Payer: Medicare PPO | Source: Ambulatory Visit | Attending: Internal Medicine | Admitting: Internal Medicine

## 2021-07-23 DIAGNOSIS — R1013 Epigastric pain: Secondary | ICD-10-CM | POA: Insufficient documentation

## 2021-07-23 DIAGNOSIS — D1809 Hemangioma of other sites: Secondary | ICD-10-CM | POA: Diagnosis not present

## 2021-07-23 DIAGNOSIS — R140 Abdominal distension (gaseous): Secondary | ICD-10-CM | POA: Diagnosis not present

## 2021-07-23 DIAGNOSIS — R634 Abnormal weight loss: Secondary | ICD-10-CM | POA: Insufficient documentation

## 2021-07-23 DIAGNOSIS — G8929 Other chronic pain: Secondary | ICD-10-CM | POA: Diagnosis not present

## 2021-07-23 MED ORDER — IOHEXOL 300 MG/ML  SOLN
75.0000 mL | Freq: Once | INTRAMUSCULAR | Status: AC | PRN
  Administered 2021-07-23: 75 mL via INTRAVENOUS

## 2021-08-11 DIAGNOSIS — M7122 Synovial cyst of popliteal space [Baker], left knee: Secondary | ICD-10-CM | POA: Diagnosis not present

## 2021-08-11 DIAGNOSIS — M47816 Spondylosis without myelopathy or radiculopathy, lumbar region: Secondary | ICD-10-CM | POA: Diagnosis not present

## 2021-08-14 ENCOUNTER — Telehealth: Payer: Self-pay | Admitting: *Deleted

## 2021-08-14 MED ORDER — LINACLOTIDE 145 MCG PO CAPS: 145.0000 ug | ORAL_CAPSULE | Freq: Every day | ORAL | 11 refills | Status: DC

## 2021-08-14 NOTE — Telephone Encounter (Signed)
RX sent in per Dr. Gala Romney #[phone] x 11 refills

## 2021-08-14 NOTE — Addendum Note (Signed)
Addended by: Cheron Every on: 08/14/2021 01:39 PM   Modules accepted: Orders

## 2021-08-14 NOTE — Telephone Encounter (Signed)
Patient called in. Reports at her last OV she was given samples of Linzess 145mcg. It does seem to work for her and would like an Rx to be sent into [facility] for her. Dr. Gala Romney, please advise thanks!

## 2021-09-04 ENCOUNTER — Encounter: Payer: Self-pay | Admitting: Internal Medicine

## 2021-10-01 DIAGNOSIS — H2513 Age-related nuclear cataract, bilateral: Secondary | ICD-10-CM | POA: Diagnosis not present

## 2021-10-01 DIAGNOSIS — H0288B Meibomian gland dysfunction left eye, upper and lower eyelids: Secondary | ICD-10-CM | POA: Diagnosis not present

## 2021-10-01 DIAGNOSIS — L718 Other rosacea: Secondary | ICD-10-CM | POA: Diagnosis not present

## 2021-10-01 DIAGNOSIS — H0288A Meibomian gland dysfunction right eye, upper and lower eyelids: Secondary | ICD-10-CM | POA: Diagnosis not present

## 2021-10-06 DIAGNOSIS — Z1231 Encounter for screening mammogram for malignant neoplasm of breast: Secondary | ICD-10-CM | POA: Diagnosis not present

## 2021-10-14 ENCOUNTER — Other Ambulatory Visit: Payer: Self-pay

## 2021-10-14 ENCOUNTER — Ambulatory Visit: Payer: Medicare PPO | Admitting: Gastroenterology

## 2021-10-14 ENCOUNTER — Encounter: Payer: Self-pay | Admitting: Gastroenterology

## 2021-10-14 VITALS — BP 130/70 | HR 80 | Temp 97.1°F | Ht 66.0 in | Wt 107.0 lb

## 2021-10-14 DIAGNOSIS — K5900 Constipation, unspecified: Secondary | ICD-10-CM | POA: Diagnosis not present

## 2021-10-14 DIAGNOSIS — R140 Abdominal distension (gaseous): Secondary | ICD-10-CM

## 2021-10-14 NOTE — Progress Notes (Signed)
Primary Care Physician: Rosine Door  Primary Gastroenterologist:  Garfield Cornea, MD   Chief Complaint  Patient presents with   [facility]    Doesn't feels like she completely empties bowels. Stopped Linzess d/t diarrhea. Amitriptyline caused drowsiness.    HPI: [name] is a 66 y.o. female here for follow-up.  Last seen in the office in September.  She had bloating, periumbilical abdominal pain, and weight loss.  From November 2018 through February 2019 which palpation a couple of times for increased gas, bloating, constipation.  Previous work-up included negative celiac markers.  Colonoscopy December 2018 unremarkable.  Symptoms were never debilitating.    In December 2020 however she presented with significant weight loss, patient suspected related to stress around that time, father had passed away and her mother was living in an independent living center and could not take visitors due to [place] pandemic.  Continues to take care of son with cerebral palsy.  She is also had skin cancer requiring Mohs.  She presented with left-sided abdominal pain which occurred after eating frozen store-bought shrimp and rice.  Symptoms associated with excessive burping.  Symptoms lasted for several days.  She was having bowel movements every morning after Benefiber.  Noted increased bloating and periumbilical abdominal discomfort.  CT abdomen pelvis with contrast January 2021 showed prominent stool throughout the colon favoring constipation, reduced aortic-SMA angle at 20 degrees, with some narrowing of the left renal vein in this facility such that nutcracker syndrome cannot be excluded.  No findings of SMA syndrome.  She was seen by vascular surgeon, Dr. Servando Snare for evaluation, he did not feel that the anatomic abnormality was contributing to her pain syndrome.  Chest x-ray due to weight loss, was negative.  She ended up with CT chest with contrast in April 2021 ordered by oncology  for weight loss.  Noted to have emphysema, 6 x 7 mm subpleural posterior left upper lobe pulmonary nodule.  Undergoing serial CTs with Dr. Delton Coombes.  Also noted to have multiple hypervascular liver lesions measuring up to 1.6 cm not definitively seen on prior CT, potentially related to bolus timing.  MR abdomen with and without contrast completed in May 2021 due to liver lesion.  Noted to have benign hemangioma.  She completed EGD February 2021 that showed gastric mucosa coated by thick tenacious bile stained mucus.  Stomach diffusely injected with scattered focal hemorrhagic erosions but no frank ulcers.  Small hiatal hernia.  Gastric biopsy showed nonspecific reactive gastropathy, no H. pylori.  Duodenum appeared normal.  Last colonoscopy was in December 2018 which was normal.  Celiac serologies negative 2018.  IgA level slightly low.  TTG IgA was <2. TTG IgG was 1.  IgG and IgM normal.  Pancreatic elastase of 440.  She perceives gluten sensitivity and avoids gluten. Was also noted to have an elevated albumin/globulin ratio (2.8 --> 2.67) for which she was referred to hematology, no intervention pursued.  Saw Dr. Jerene Pitch at [facility] for second opinion, no additional thoughts.  He felt like her postprandial symptoms of pain and belching more consistent with dyspepsia plus or minus gastritis.  She weaned off PPI under the direction of Dr. Jerene Pitch due to her concern of osteoporosis.     Saw Dr. Blake Divine for tiny umbilical hernia.  Felt hernia could be repaired with suture only since it is small.  Patient was concerned about downtime postoperatively would interfere with her taking care of her son who  has cerebral palsy and requires 24/7 care.  Saw her gynecologist in June 2022, pelvic ultrasound negative.  Pap smear normal.  Today:  Prior LUQ pain resolved after treatment for gastritis and with avoiding gluten. Overall her symptoms did get better with gluten avoidance. She tries to consume low  sugar diet (due to prediabetes) as well. She say functional medicine doctor in latter part of 2021 [name]) and was doing better at that time with bloating. She did well for several months. Thinks she may ask for a follow up.  Over Christmas she indulged in desserts containing both sugar and gluten.  States she did not feel well afterwards and noted bowel changes.  No recurrent left upper quadrant pain however.  Has noted increased bloating.  Bloating is her primary problem.  Tries avoid FODMAP triggers such as sweet potatoes, almond butter.  Store-bought potato salad increased bloating.  Some days she tolerates certain foods and other days it causes bloating.  She feels like her symptoms are somewhat related to stress, husband related. Primarily eating fish/chicken.  Eats very little beef or pork.   After last office visit she was advised to stop Benefiber, start MiraLAX once daily.  She tried amitriptyline 10mg  at bedtime for about 6 weeks but didn't like feeling groggy the next morning and didn't feel like she had any significant improvement. On 10/26 she had CT A/P with contrast that showed again moderate stool burden throughout the colon. She was started on Linzess 179mcg daily.  At first she better, felt it helped bloating somewhat.  Last month she decided to stop Linzess because of intermittent diarrhea in the afternoons.  Diarrhea resolved if she did not take Linzess daily.  She was concerned that she was losing nutrients from her diarrhea.  For the most part was only having a couple of stools per day but stools were watery.  No melena or rectal bleeding.  Continues to have soft stool daily.  Typically mushy consistency and associated with a lot of gas.  Has started using smooth move senna, sometimes alternates with MiraLAX at nighttime but sometimes takes both.  Has tried probiotics in the past for bloating without any notable relief.  At home she typically weighs between 101 102 pounds in the  morning, unclothed.  She has weighed herself several times throughout the day to see if she has gained any weight.  Weights documented in the office have fluctuated between 104 and 109 pounds since December 2020.   February 2019: 119.8 pounds March 2020: 112 pounds December 2020: 109 pounds March 2021: 105.8 pound April 2021: 109.2 pounds April 2022: 109.5 pounds June 2022: 106 pound September 2022: 104.4 pound Today: 107 pounds    Current Outpatient Medications  Medication Sig Dispense Refill   Ascorbic Acid (VITA-C PO) Take by mouth daily.     COLLAGEN PO Take by mouth daily.     cycloSPORINE (RESTASIS) 0.05 % ophthalmic emulsion Place 1 drop into both eyes 2 (two) times daily.     desonide (DESOWEN) 0.05 % ointment Apply 1 application topically as needed.     estradiol (ESTRACE) 0.1 MG/GM vaginal cream Place 1 Applicatorful 2 (two) times a week vaginally.      Multiple Vitamin (MULTIVITAMIN) tablet Take 1 tablet by mouth daily.     Multiple Vitamins-Minerals (ALGAE BASED CALCIUM PO) Take by mouth 2 (two) times daily.     NON FORMULARY One scoop of protein powder once daily. One scoop of Barley greens powder once daily.  polyethylene glycol (MIRALAX / GLYCOLAX) 17 g packet Take 17 g by mouth daily.     Strontium Chloride POWD by Does not apply route. capsules     valACYclovir (VALTREX) 1000 MG tablet Take 2,000 mg by mouth 2 (two) times daily as needed (fever blisters).      No current facility-administered medications for this visit.    Allergies as of 10/14/2021 - Review Complete 10/14/2021  Allergen Reaction Noted   Amoxicillin  08/12/2017   Bactrim [sulfamethoxazole-trimethoprim] Hives 08/12/2017   Keflex [cephalexin] Hives 08/12/2017   Hydrocodone Nausea Only 08/01/2018    ROS:  General: Negative for anorexia, weight loss, fever, chills, fatigue, weakness.  See HPI ENT: Negative for hoarseness, difficulty swallowing , nasal congestion. CV: Negative for chest pain,  angina, palpitations, dyspnea on exertion, peripheral edema.  Respiratory: Negative for dyspnea at rest, dyspnea on exertion, cough, sputum, wheezing.  GI: See history of present illness. GU:  Negative for dysuria, hematuria, urinary incontinence, urinary frequency, nocturnal urination.  Endo: Negative for unusual weight change.    Physical Examination:   BP 130/70    Pulse 80    Temp (!) 97.1 F (36.2 C) (Temporal)    Ht 5\' 6"  (1.676 m)    Wt 107 lb (48.5 kg)    BMI 17.27 kg/m   General: Thin, female in no acute distress.  Eyes: No icterus. Mouth:masked Lungs: Clear to auscultation bilaterally.  Heart: Regular rate and rhythm, no murmurs rubs or gallops.  Abdomen: Bowel sounds are normal, nontender, nondistended, no hepatosplenomegaly or masses, no abdominal bruits, no rebound or guarding.  Tiny umbilical hernia, less than size of 5th finger tip. Extremities: No lower extremity edema. No clubbing or deformities. Neuro: Alert and oriented x 4   Skin: Warm and dry, no jaundice.   Psych: Alert and cooperative, normal mood and affect.  Labs:  Lab Results  Component Value Date   CREATININE 0.75 07/21/2021   BUN 21 01/20/2021   NA 135 01/20/2021   K 4.0 01/20/2021   CL 98 01/20/2021   CO2 24 01/20/2021   Lab Results  Component Value Date   ALT 18 01/20/2021   AST 25 01/20/2021   ALKPHOS 62 01/20/2021   BILITOT 0.7 01/20/2021   Lab Results  Component Value Date   WBC 6.2 01/20/2021   HGB 13.2 01/20/2021   HCT 40.2 01/20/2021   MCV 92.6 01/20/2021   PLT 223 01/20/2021   Lab Results  Component Value Date   IRON 49 12/19/2019   TIBC 361 12/19/2019   FERRITIN 89 12/19/2019   Lab Results  Component Value Date   VITAMINB12 479 12/19/2019   Lab Results  Component Value Date   FOLATE 25.4 12/19/2019   Lab Results  Component Value Date   LIPASE 63 10/03/2019   No results found for: ESRSEDRATE, POCTSEDRATE No results found for: CRP Lab Results  Component Value  Date   TSH 1.570 11/28/2019   Lab Results  Component Value Date   HGBA1C 5.7 (H) 11/28/2019    Imaging Studies: No results found.   Assessment:  Very pleasant 66 year old female with multiple chronic GI symptoms presenting for follow-up.  Most prominent symptom has been of bloating recently.  Extensive evaluation as outlined above.  Fortunately her left upper quadrant pain and weight loss has resolved.  She noted biggest improvement in her abdominal pain when she started avoiding gluten and also when she was treated for gastritis.  CT imaging has showed moderate/prominent amount of stool  throughout the colon on 2 studies.  Likely does contribute to some of her abdominal bloating.  With dietary indiscretions over Christmas, she did seem to have somewhat of a decline in her symptoms with increased bloating/gas.  She did not appreciate significant improvement with amitriptyline, 10 mg at bedtime for several weeks which seemed to cause a bit of grogginess for her.  Linzess 145 mcg daily resulted in loose stools which concerned her, worried about loss of nutrition and weight from frequent stools.  Although initially on Linzess seem to appreciate some improvement in her bloating.  Her work up has been reassuring for the most part.  At this time she is now utilizing MiraLAX and/or smooth move with senna at bedtime, is having some mushy stool every day but continues to have bloating/gas which is her biggest complaint.  MiraLAX may be contributing to her gas.  I have requested switching her bowel regimen to Amitiza 8 mcg once or twice daily to improve stool evacuation and hopefully help with bloating discomfort.  She would like to do some research on the medication before committing.  The only other thing could offer evaluating for her would be checking for biliary dyskinesia, although I feel like her symptoms are atypical.  We have not checked for alpha gal which would be easy to exclude with blood work  although I feel again that this is less likely cause of her symptoms.  Plan: Patient to let me know if she wants to move forward with trying low dose Amitiza 30mcg once to twice daily or check for Alpha-Gal. Will await mychart message from her regarding her decision.

## 2021-10-14 NOTE — Patient Instructions (Signed)
Consider trial of Amitiza (lubiprostone) to improve evacuating your stools and help with bloating symptoms. I would start you low dose at 59mcg once daily. If your symptoms improved significantly after using for one month, this would be helpful information.  If you decide you would like me to check for Alpha-Gal let me know.

## 2021-10-22 DIAGNOSIS — R922 Inconclusive mammogram: Secondary | ICD-10-CM | POA: Diagnosis not present

## 2021-10-22 DIAGNOSIS — R928 Other abnormal and inconclusive findings on diagnostic imaging of breast: Secondary | ICD-10-CM | POA: Diagnosis not present

## 2021-11-12 DIAGNOSIS — Z85828 Personal history of other malignant neoplasm of skin: Secondary | ICD-10-CM | POA: Diagnosis not present

## 2021-11-12 DIAGNOSIS — L570 Actinic keratosis: Secondary | ICD-10-CM | POA: Diagnosis not present

## 2021-11-18 DIAGNOSIS — M8589 Other specified disorders of bone density and structure, multiple sites: Secondary | ICD-10-CM | POA: Diagnosis not present

## 2021-11-18 DIAGNOSIS — M810 Age-related osteoporosis without current pathological fracture: Secondary | ICD-10-CM | POA: Diagnosis not present

## 2021-12-01 ENCOUNTER — Other Ambulatory Visit (HOSPITAL_COMMUNITY): Payer: Self-pay | Admitting: Sports Medicine

## 2021-12-01 ENCOUNTER — Other Ambulatory Visit: Payer: Self-pay | Admitting: Sports Medicine

## 2021-12-01 DIAGNOSIS — R799 Abnormal finding of blood chemistry, unspecified: Secondary | ICD-10-CM | POA: Diagnosis not present

## 2021-12-01 DIAGNOSIS — M25562 Pain in left knee: Secondary | ICD-10-CM

## 2021-12-01 DIAGNOSIS — E782 Mixed hyperlipidemia: Secondary | ICD-10-CM | POA: Diagnosis not present

## 2021-12-01 DIAGNOSIS — Q782 Osteopetrosis: Secondary | ICD-10-CM | POA: Diagnosis not present

## 2021-12-01 DIAGNOSIS — R7303 Prediabetes: Secondary | ICD-10-CM | POA: Diagnosis not present

## 2021-12-01 DIAGNOSIS — D849 Immunodeficiency, unspecified: Secondary | ICD-10-CM | POA: Diagnosis not present

## 2021-12-01 DIAGNOSIS — M8580 Other specified disorders of bone density and structure, unspecified site: Secondary | ICD-10-CM | POA: Diagnosis not present

## 2021-12-01 DIAGNOSIS — E559 Vitamin D deficiency, unspecified: Secondary | ICD-10-CM | POA: Diagnosis not present

## 2021-12-01 DIAGNOSIS — E039 Hypothyroidism, unspecified: Secondary | ICD-10-CM | POA: Diagnosis not present

## 2021-12-01 DIAGNOSIS — M7122 Synovial cyst of popliteal space [Baker], left knee: Secondary | ICD-10-CM | POA: Diagnosis not present

## 2021-12-01 DIAGNOSIS — E639 Nutritional deficiency, unspecified: Secondary | ICD-10-CM | POA: Diagnosis not present

## 2021-12-19 ENCOUNTER — Ambulatory Visit (HOSPITAL_COMMUNITY)
Admission: RE | Admit: 2021-12-19 | Discharge: 2021-12-19 | Disposition: A | Discharge status: Home / Self Care | Payer: Medicare PPO | Source: Ambulatory Visit | Attending: Sports Medicine | Admitting: Sports Medicine

## 2021-12-19 ENCOUNTER — Other Ambulatory Visit: Payer: Self-pay

## 2021-12-19 DIAGNOSIS — M25562 Pain in left knee: Secondary | ICD-10-CM | POA: Diagnosis not present

## 2022-01-01 DIAGNOSIS — M7122 Synovial cyst of popliteal space [Baker], left knee: Secondary | ICD-10-CM | POA: Diagnosis not present

## 2022-01-07 DIAGNOSIS — L308 Other specified dermatitis: Secondary | ICD-10-CM | POA: Diagnosis not present

## 2022-01-07 DIAGNOSIS — L570 Actinic keratosis: Secondary | ICD-10-CM | POA: Diagnosis not present

## 2022-01-07 DIAGNOSIS — D1801 Hemangioma of skin and subcutaneous tissue: Secondary | ICD-10-CM | POA: Diagnosis not present

## 2022-01-07 DIAGNOSIS — L821 Other seborrheic keratosis: Secondary | ICD-10-CM | POA: Diagnosis not present

## 2022-01-07 DIAGNOSIS — D485 Neoplasm of uncertain behavior of skin: Secondary | ICD-10-CM | POA: Diagnosis not present

## 2022-01-07 DIAGNOSIS — L814 Other melanin hyperpigmentation: Secondary | ICD-10-CM | POA: Diagnosis not present

## 2022-01-07 DIAGNOSIS — L819 Disorder of pigmentation, unspecified: Secondary | ICD-10-CM | POA: Diagnosis not present

## 2022-01-07 DIAGNOSIS — D2271 Melanocytic nevi of right lower limb, including hip: Secondary | ICD-10-CM | POA: Diagnosis not present

## 2022-01-07 DIAGNOSIS — Z85828 Personal history of other malignant neoplasm of skin: Secondary | ICD-10-CM | POA: Diagnosis not present

## 2022-01-14 ENCOUNTER — Inpatient Hospital Stay (HOSPITAL_COMMUNITY): Payer: Medicare PPO | Attending: Hematology

## 2022-01-14 DIAGNOSIS — R768 Other specified abnormal immunological findings in serum: Secondary | ICD-10-CM

## 2022-01-14 DIAGNOSIS — D809 Immunodeficiency with predominantly antibody defects, unspecified: Secondary | ICD-10-CM | POA: Insufficient documentation

## 2022-01-14 DIAGNOSIS — R911 Solitary pulmonary nodule: Secondary | ICD-10-CM | POA: Insufficient documentation

## 2022-01-14 DIAGNOSIS — R7689 Other specified abnormal immunological findings in serum: Secondary | ICD-10-CM

## 2022-01-14 LAB — CBC WITH DIFFERENTIAL/PLATELET
Abs Immature Granulocytes: 0.01 10*3/uL (ref 0.00–0.07)
Basophils Absolute: 0.1 10*3/uL (ref 0.0–0.1)
Basophils Relative: 1 %
Eosinophils Absolute: 0.1 10*3/uL (ref 0.0–0.5)
Eosinophils Relative: 2 %
HCT: 39.1 % (ref 36.0–46.0)
Hemoglobin: 12.9 g/dL (ref 12.0–15.0)
Immature Granulocytes: 0 %
Lymphocytes Relative: 48 %
Lymphs Abs: 2.2 10*3/uL (ref 0.7–4.0)
MCH: 29.9 pg (ref 26.0–34.0)
MCHC: 33 g/dL (ref 30.0–36.0)
MCV: 90.7 fL (ref 80.0–100.0)
Monocytes Absolute: 0.4 10*3/uL (ref 0.1–1.0)
Monocytes Relative: 8 %
Neutro Abs: 1.9 10*3/uL (ref 1.7–7.7)
Neutrophils Relative %: 41 %
Platelets: 288 10*3/uL (ref 150–400)
RBC: 4.31 MIL/uL (ref 3.87–5.11)
RDW: 13.4 % (ref 11.5–15.5)
WBC: 4.7 10*3/uL (ref 4.0–10.5)
nRBC: 0 % (ref 0.0–0.2)

## 2022-01-15 LAB — IGG, IGA, IGM
IgA: 77 mg/dL — ABNORMAL LOW (ref 87–352)
IgG (Immunoglobin G), Serum: 583 mg/dL — ABNORMAL LOW (ref 586–1602)
IgM (Immunoglobulin M), Srm: 81 mg/dL (ref 26–217)

## 2022-01-20 ENCOUNTER — Other Ambulatory Visit (HOSPITAL_COMMUNITY): Payer: Medicare PPO

## 2022-01-27 ENCOUNTER — Inpatient Hospital Stay (HOSPITAL_COMMUNITY): Payer: Medicare PPO | Attending: Hematology | Admitting: Hematology

## 2022-01-27 VITALS — BP 115/76 | HR 78 | Temp 97.6°F | Resp 18 | Wt 108.0 lb

## 2022-01-27 DIAGNOSIS — Z808 Family history of malignant neoplasm of other organs or systems: Secondary | ICD-10-CM | POA: Diagnosis not present

## 2022-01-27 DIAGNOSIS — R911 Solitary pulmonary nodule: Secondary | ICD-10-CM | POA: Diagnosis not present

## 2022-01-27 DIAGNOSIS — R768 Other specified abnormal immunological findings in serum: Secondary | ICD-10-CM

## 2022-01-27 DIAGNOSIS — D802 Selective deficiency of immunoglobulin A [IgA]: Secondary | ICD-10-CM | POA: Insufficient documentation

## 2022-01-27 DIAGNOSIS — Z800 Family history of malignant neoplasm of digestive organs: Secondary | ICD-10-CM | POA: Diagnosis not present

## 2022-01-27 DIAGNOSIS — Z806 Family history of leukemia: Secondary | ICD-10-CM | POA: Insufficient documentation

## 2022-01-27 NOTE — Progress Notes (Signed)
? ?[place] ?[address] ?[place], [place] [address] ? ? ?CLINIC:  ?Medical Oncology/Hematology ? ?PCP:  ?Rosalee Kaufman, PA-C ?[place] / [place] [address]  ?[phone] ? ?REASON FOR VISIT:  ?Follow-up for decreased IgA levels and lung nodule ? ?PRIOR THERAPY: none ? ?CURRENT THERAPY: surveillance ? ?INTERVAL HISTORY:  ?[name], a 66 y.o. female, returns for routine follow-up for her decreased IgA levels and lung nodule. Leyanna was last seen on 01/23/2021. ? ?Today she reports feeling well. She denies any infections in the past year. She denies fevers, nights sweats, and weight loss. She denies sinus problems or infections. She denies skin rash.  ? ?REVIEW OF SYSTEMS:  ?Review of Systems  ?Constitutional:  Positive for fatigue. Negative for appetite change, fever and unexpected weight change.  ?Gastrointestinal:  Positive for constipation.  ?Endocrine: Negative for hot flashes.  ?Musculoskeletal:  Positive for arthralgias (knee).  ?Skin:  Negative for rash.  ?All other systems reviewed and are negative. ? ?PAST MEDICAL/SURGICAL HISTORY:  ?Past Medical History:  ?Diagnosis Date  ? GERD (gastroesophageal reflux disease)   ? Hyperlipidemia   ? Lung nodule   ? left   ? Osteopenia   ? Osteoporosis   ? Squamous cell skin cancer   ? Right leg  ? ?Past Surgical History:  ?Procedure Laterality Date  ? BIOPSY  11/02/2019  ? Procedure: BIOPSY;  Surgeon: Daneil Dolin, MD;  Location: [facility];  Service: Endoscopy;;  gastric  ? Biopsy right leg Right   ? skin  ? COLONOSCOPY N/A 09/17/2017  ? Dr. Gala Romney: normal colonoscopy.  Next colonoscopy in 10 years.  ? ESOPHAGOGASTRODUODENOSCOPY (EGD) WITH PROPOFOL N/A 11/02/2019  ? Dr. Gala Romney: Gastric mucosa coated by thick tenacious bile-stained mucus.  Stomach diffusely injected with scattered focal hemorrhagic erosions but no frank ulcer.  Small hiatal hernia.  Gastric biopsy showed nonspecific reactive gastropathy, no H. pylori.  Duodenum appeared  normal.  ? TONSILLECTOMY    ? ? ?SOCIAL HISTORY:  ?Social History  ? ?Socioeconomic History  ? Marital status: Married  ?  Spouse name: Not on file  ? Number of children: 1  ? Years of education: Not on file  ? Highest education level: Not on file  ?Occupational History  ? Not on file  ?Tobacco Use  ? Smoking status: Never  ? Smokeless tobacco: Never  ?Vaping Use  ? Vaping Use: Never used  ?Substance and Sexual Activity  ? Alcohol use: No  ? Drug use: No  ? Sexual activity: Yes  ?Other Topics Concern  ? Not on file  ?Social History Narrative  ? Not on file  ? ?Social Determinants of Health  ? ?Financial Resource Strain: Not on file  ?Food Insecurity: Not on file  ?Transportation Needs: Not on file  ?Physical Activity: Not on file  ?Stress: Not on file  ?Social Connections: Not on file  ?Intimate Partner Violence: Not on file  ? ? ?FAMILY HISTORY:  ?Family History  ?Problem Relation Age of Onset  ? Osteoporosis Mother   ? Kidney disease Mother   ? Autoimmune disease Mother   ? Leukemia Mother   ? Parkinson's disease Father   ? Dementia Father   ? Kidney disease Father   ? Stroke Father   ? Angina Father   ? Skin cancer Father   ? Colon cancer Cousin   ? Parkinson's disease Maternal Grandfather   ? Heart attack Paternal Grandfather   ? Cerebral  palsy Son   ? ? ?CURRENT MEDICATIONS:  ?Current Outpatient Medications  ?Medication Sig Dispense Refill  ? Ascorbic Acid (VITA-C PO) Take by mouth daily.    ? COLLAGEN PO Take by mouth daily.    ? cycloSPORINE (RESTASIS) 0.05 % ophthalmic emulsion Place 1 drop into both eyes 2 (two) times daily.    ? estradiol (ESTRACE) 0.1 MG/GM vaginal cream Place 1 Applicatorful 2 (two) times a week vaginally.     ? Multiple Vitamin (MULTIVITAMIN) tablet Take 1 tablet by mouth daily.    ? Multiple Vitamins-Minerals (ALGAE BASED CALCIUM PO) Take by mouth 2 (two) times daily.    ? NON FORMULARY One scoop of protein powder once daily. ?One scoop of Barley greens powder once daily.    ?  polyethylene glycol (MIRALAX / GLYCOLAX) 17 g packet Take 17 g by mouth daily.    ? Probiotic Product (PRO-BIOTIC BLEND PO) Take by mouth.    ? Strontium Chloride POWD by Does not apply route. capsules    ? valACYclovir (VALTREX) 1000 MG tablet Take 2,000 mg by mouth 2 (two) times daily as needed (fever blisters).     ? desonide (DESOWEN) 0.05 % ointment Apply 1 application topically as needed. (Patient not taking: Reported on 01/27/2022)    ? ?No current facility-administered medications for this visit.  ? ? ?ALLERGIES:  ?Allergies  ?Allergen Reactions  ? Amoxicillin   ?  Severe fatigue  ?Did it involve swelling of the face/tongue/throat, SOB, or low BP? No ?Did it involve sudden or severe rash/hives, skin peeling, or any reaction on the inside of your mouth or nose? No ?Did you need to seek medical attention at a hospital or doctor's office? No ?When did it last happen?      6 - 7 years ?If all above answers are ?NO?, may proceed with cephalosporin use. ?  ? Bactrim [Sulfamethoxazole-Trimethoprim] Hives  ? Keflex [Cephalexin] Hives  ? Hydrocodone Nausea Only  ? ? ?PHYSICAL EXAM:  ?Performance status (ECOG): 1 - Symptomatic but completely ambulatory ? ?Vitals:  ? 01/27/22 1524  ?BP: 115/76  ?Pulse: 78  ?Resp: 18  ?Temp: 97.6 ?F (36.4 ?C)  ?SpO2: 97%  ? ?Wt Readings from Last 3 Encounters:  ?01/27/22 108 lb (49 kg)  ?10/14/21 107 lb (48.5 kg)  ?06/24/21 104 lb 6.4 oz (47.4 kg)  ? ?Physical Exam ?Vitals reviewed.  ?Constitutional:   ?   Appearance: Normal appearance.  ?Cardiovascular:  ?   Rate and Rhythm: Normal rate and regular rhythm.  ?   Pulses: Normal pulses.  ?   Heart sounds: Normal heart sounds.  ?Pulmonary:  ?   Effort: Pulmonary effort is normal.  ?   Breath sounds: Normal breath sounds.  ?Abdominal:  ?   Palpations: Abdomen is soft. There is no hepatomegaly, splenomegaly or mass.  ?   Tenderness: There is no abdominal tenderness.  ?Musculoskeletal:  ?   Right lower leg: No edema.  ?   Left lower leg: No  edema.  ?Lymphadenopathy:  ?   Cervical: No cervical adenopathy.  ?   Right cervical: No superficial cervical adenopathy. ?   Left cervical: No superficial cervical adenopathy.  ?   Upper Body:  ?   Right upper body: No supraclavicular or axillary adenopathy.  ?   Left upper body: No supraclavicular or axillary adenopathy.  ?   Lower Body: No right inguinal adenopathy. No left inguinal adenopathy.  ?Neurological:  ?   General: No focal deficit present.  ?  Mental Status: She is alert and oriented to person, place, and time.  ?Psychiatric:     ?   Mood and Affect: Mood normal.     ?   Behavior: Behavior normal.  ? ? ?LABORATORY DATA:  ?I have reviewed the labs as listed.  ? ?  Latest Ref Rng & Units 01/14/2022  ?  9:25 AM 01/20/2021  ?  2:39 PM 07/16/2020  ?  1:49 PM  ?CBC  ?WBC 4.0 - 10.5 K/uL 4.7   6.2   6.5    ?Hemoglobin 12.0 - 15.0 g/dL 12.9   13.2   12.8    ?Hematocrit 36.0 - 46.0 % 39.1   40.2   39.2    ?Platelets 150 - 400 K/uL 288   223   238    ? ? ?  Latest Ref Rng & Units 07/21/2021  ?  8:17 AM 01/20/2021  ?  2:39 PM 07/16/2020  ?  1:49 PM  ?CMP  ?Glucose 70 - 99 mg/dL  86   89    ?BUN 8 - 23 mg/dL  21   18    ?Creatinine 0.50 - 1.05 mg/dL 0.75   0.81   0.77    ?Sodium 135 - 145 mmol/L  135   138    ?Potassium 3.5 - 5.1 mmol/L  4.0   3.7    ?Chloride 98 - 111 mmol/L  98   100    ?CO2 22 - 32 mmol/L  24   27    ?Calcium 8.9 - 10.3 mg/dL  8.5   8.9    ?Total Protein 6.5 - 8.1 g/dL  6.3   6.6    ?Total Bilirubin 0.3 - 1.2 mg/dL  0.7   0.7    ?Alkaline Phos 38 - 126 U/L  62   74    ?AST 15 - 41 U/L  25   26    ?ALT 0 - 44 U/L  18   22    ? ?   ?Component Value Date/Time  ? RBC 4.31 01/14/2022 0925  ? MCV 90.7 01/14/2022 0925  ? MCV 92 10/03/2019 1345  ? MCH 29.9 01/14/2022 0925  ? MCHC 33.0 01/14/2022 0925  ? RDW 13.4 01/14/2022 0925  ? RDW 12.4 10/03/2019 1345  ? LYMPHSABS 2.2 01/14/2022 0925  ? LYMPHSABS 1.6 10/03/2019 1345  ? MONOABS 0.4 01/14/2022 0925  ? EOSABS 0.1 01/14/2022 0925  ? EOSABS 0.0 10/03/2019  1345  ? BASOSABS 0.1 01/14/2022 0925  ? BASOSABS 0.0 10/03/2019 1345  ? ? ?DIAGNOSTIC IMAGING:  ?I have independently reviewed the scans and discussed with the patient. ?No results found.  ? ?ASSESSMENT:  ?1.  Mil

## 2022-01-27 NOTE — Patient Instructions (Addendum)
[facility] [facility] ?Discharge Instructions ? ?You were seen and examined today by Dr. Delton Coombes. ? ?Dr. Delton Coombes discussed your most recent lab work and everything looks good.  ? ?Follow-up as scheduled in one year. ? ? ? ? ?Thank you for choosing [facility] at [facility] to provide your oncology and hematology care.  To afford each patient quality time with our provider, please arrive at least 15 minutes before your scheduled appointment time.  ? ?If you have a lab appointment with the [facility] please come in thru the Main Entrance and check in at the main information desk. ? ?You need to re-schedule your appointment should you arrive 10 or more minutes late.  We strive to give you quality time with our providers, and arriving late affects you and other patients whose appointments are after yours.  Also, if you no show three or more times for appointments you may be dismissed from the clinic at the providers discretion.     ?Again, thank you for choosing [facility].  Our hope is that these requests will decrease the amount of time that you wait before being seen by our physicians.       ?_____________________________________________________________ ? ?Should you have questions after your visit to [facility], please contact our office at [phone] and follow the prompts.  Our office hours are 8:00 a.m. and 4:30 p.m. Monday - Friday.  Please note that voicemails left after 4:00 p.m. may not be returned until the following business day.  We are closed weekends and major holidays.  You do have access to a nurse 24-7, just call the main number to the clinic [phone] and do not press any options, hold on the line and a nurse will answer the phone.   ? ?For prescription refill requests, have your pharmacy contact our office and allow 72 hours.   ? ?Due to Covid, you will need to wear a mask upon entering the hospital. If you  do not have a mask, a mask will be given to you at the Main Entrance upon arrival. For doctor visits, patients may have 1 support person age 82 or older with them. For treatment visits, patients can not have anyone with them due to social distancing guidelines and our immunocompromised population.  ? ?  ?

## 2022-01-28 DIAGNOSIS — G8918 Other acute postprocedural pain: Secondary | ICD-10-CM | POA: Diagnosis not present

## 2022-01-28 DIAGNOSIS — S83272A Complex tear of lateral meniscus, current injury, left knee, initial encounter: Secondary | ICD-10-CM | POA: Diagnosis not present

## 2022-01-28 DIAGNOSIS — S83282A Other tear of lateral meniscus, current injury, left knee, initial encounter: Secondary | ICD-10-CM | POA: Diagnosis not present

## 2022-01-28 DIAGNOSIS — M948X6 Other specified disorders of cartilage, lower leg: Secondary | ICD-10-CM | POA: Diagnosis not present

## 2022-01-28 DIAGNOSIS — M1712 Unilateral primary osteoarthritis, left knee: Secondary | ICD-10-CM | POA: Diagnosis not present

## 2022-01-28 DIAGNOSIS — S83512A Sprain of anterior cruciate ligament of left knee, initial encounter: Secondary | ICD-10-CM | POA: Diagnosis not present

## 2022-02-02 DIAGNOSIS — M25562 Pain in left knee: Secondary | ICD-10-CM | POA: Diagnosis not present

## 2022-02-06 DIAGNOSIS — M25562 Pain in left knee: Secondary | ICD-10-CM | POA: Diagnosis not present

## 2022-02-09 DIAGNOSIS — M25562 Pain in left knee: Secondary | ICD-10-CM | POA: Diagnosis not present

## 2022-02-11 DIAGNOSIS — M25562 Pain in left knee: Secondary | ICD-10-CM | POA: Diagnosis not present

## 2022-02-16 DIAGNOSIS — M25562 Pain in left knee: Secondary | ICD-10-CM | POA: Diagnosis not present

## 2022-02-18 DIAGNOSIS — M25562 Pain in left knee: Secondary | ICD-10-CM | POA: Diagnosis not present

## 2022-02-20 DIAGNOSIS — I10 Essential (primary) hypertension: Secondary | ICD-10-CM | POA: Diagnosis not present

## 2022-02-20 DIAGNOSIS — M79673 Pain in unspecified foot: Secondary | ICD-10-CM | POA: Diagnosis not present

## 2022-02-20 DIAGNOSIS — R319 Hematuria, unspecified: Secondary | ICD-10-CM | POA: Diagnosis not present

## 2022-02-20 DIAGNOSIS — Z681 Body mass index (BMI) 19 or less, adult: Secondary | ICD-10-CM | POA: Diagnosis not present

## 2022-02-25 DIAGNOSIS — M25562 Pain in left knee: Secondary | ICD-10-CM | POA: Diagnosis not present

## 2022-02-26 ENCOUNTER — Ambulatory Visit: Payer: Medicare PPO | Admitting: Physician Assistant

## 2022-02-26 VITALS — BP 147/74 | HR 89 | Ht 66.0 in | Wt 108.0 lb

## 2022-02-26 DIAGNOSIS — R3915 Urgency of urination: Secondary | ICD-10-CM

## 2022-02-26 DIAGNOSIS — R310 Gross hematuria: Secondary | ICD-10-CM | POA: Diagnosis not present

## 2022-02-26 DIAGNOSIS — K5900 Constipation, unspecified: Secondary | ICD-10-CM

## 2022-02-26 DIAGNOSIS — R3129 Other microscopic hematuria: Secondary | ICD-10-CM | POA: Diagnosis not present

## 2022-02-26 LAB — MICROSCOPIC EXAMINATION
Bacteria, UA: NONE SEEN
Renal Epithel, UA: NONE SEEN /hpf

## 2022-02-26 LAB — URINALYSIS, ROUTINE W REFLEX MICROSCOPIC
Bilirubin, UA: NEGATIVE
Glucose, UA: NEGATIVE
Ketones, UA: NEGATIVE
Leukocytes,UA: NEGATIVE
Nitrite, UA: NEGATIVE
Protein,UA: NEGATIVE
Specific Gravity, UA: 1.01 (ref 1.005–1.030)
Urobilinogen, Ur: 0.2 mg/dL (ref 0.2–1.0)
pH, UA: 7 (ref 5.0–7.5)

## 2022-02-26 LAB — BLADDER SCAN AMB NON-IMAGING: Scan Result: 13

## 2022-02-26 MED ORDER — MIRABEGRON ER 25 MG PO TB24: 25.0000 mg | ORAL_TABLET | Freq: Every day | ORAL | 0 refills | Status: DC

## 2022-02-26 NOTE — Progress Notes (Signed)
Assessment: 1. Urgency of urination - Urinalysis, Routine w reflex microscopic - BLADDER SCAN AMB NON-IMAGING - mirabegron ER (MYRBETRIQ) 25 MG TB24 tablet; Take 1 tablet (25 mg total) by mouth daily.  Dispense: 28 tablet; Refill: 0  2. Gross hematuria - Microscopic Examination  3. Microhematuria - Microscopic Examination  4. Constipation, unspecified constipation type    Plan: Samples of Myrbetriq 25 mg given and side effects discussed.  Patient will let us know how her symptoms respond and will send prescription if working well.  Advised the patient to take more time with emptying her bladder with each void and practice timed voiding.  Continue Estrace cream and efforts to alleviate constipation.  Advised the patient to call for a working visit should she develop urinary symptoms in the future and would be happy to see her.  Yearly follow-up exam already scheduled next month.  No additional hematuria work-up at this time.  Chief Complaint: No chief complaint on file.   HPI: [name] is a 66 y.o. female with history of microscopic hematuria who presents for evaluation of urinary urgency and frequency over the past 4 weeks.  No burning, dysuria, lower abdominal pain.  She denies incontinence.  She has had 1 episode of what she describes as blood-tinged urine for which she was evaluated by her primary care provider and was prescribed Macrobid for UTI.  Medical records obtained and indicate negative culture.  Since her last visit here in July 2022 for microscopic hematuria follow-up, the patient has been doing well without urinary complaints until recent onset.  Patient using Estrace cream twice weekly and history also significant for chronic constipation.  UA = 0-5 WBCs, 3-10 RBCs, no bacteria PVR = 13 mL  04/11/21 [name] is a 66yo here for followup for microhematuria. UA today shows no RBCs. No worsening LUTS. No gross hematuria. No other complaints.     Portions of the above  documentation were copied from a prior visit for review purposes only.  Allergies: Allergies  Allergen Reactions   Amoxicillin     Severe fatigue  Did it involve swelling of the face/tongue/throat, SOB, or low BP? No Did it involve sudden or severe rash/hives, skin peeling, or any reaction on the inside of your mouth or nose? No Did you need to seek medical attention at a hospital or doctor's office? No When did it last happen?      6 - 7 years If all above answers are "NO", may proceed with cephalosporin use.    Bactrim [Sulfamethoxazole-Trimethoprim] Hives   Keflex [Cephalexin] Hives   Hydrocodone Nausea Only    PMH: Past Medical History:  Diagnosis Date   GERD (gastroesophageal reflux disease)    Hyperlipidemia    Lung nodule    left    Osteopenia    Osteoporosis    Squamous cell skin cancer    Right leg    PSH: Past Surgical History:  Procedure Laterality Date   BIOPSY  11/02/2019   Procedure: BIOPSY;  Surgeon: Daneil Dolin, MD;  [facility]: [facility];  Service: Endoscopy;;  gastric   Biopsy right leg Right    skin   COLONOSCOPY N/A 09/17/2017   Dr. Gala Romney: normal colonoscopy.  Next colonoscopy in 10 years.   ESOPHAGOGASTRODUODENOSCOPY (EGD) WITH PROPOFOL N/A 11/02/2019   Dr. Gala Romney: Gastric mucosa coated by thick tenacious bile-stained mucus.  Stomach diffusely injected with scattered focal hemorrhagic erosions but no frank ulcer.  Small hiatal hernia.  Gastric biopsy showed  nonspecific reactive gastropathy, no H. pylori.  Duodenum appeared normal.   TONSILLECTOMY      SH: Social History   Tobacco Use   Smoking status: Never   Smokeless tobacco: Never  Vaping Use   Vaping Use: Never used  Substance Use Topics   Alcohol use: No   Drug use: No    ROS: Constitutional:  Negative for fever, chills, weight loss CV: Negative for chest pain Respiratory:  Negative for shortness of breath, wheezing, sleep apnea, frequent cough GI:  Negative for nausea,  vomiting, bloody stool, GERD  PE: BP (!) 147/74   Pulse 89   Ht '5\' 6"'$  (1.676 m)   Wt 108 lb (49 kg)   BMI 17.43 kg/m  GENERAL APPEARANCE:  Well appearing, well developed, well nourished, NAD HEENT:  Atraumatic, normocephalic NECK:  Supple. Trachea midline ABDOMEN:  Soft, non-tender, no masses EXTREMITIES:  Moves all extremities well, without clubbing, cyanosis, or edema NEUROLOGIC:  Alert and oriented x 3, normal gait, CN II-XII grossly intact MENTAL STATUS:  appropriate BACK:  Non-tender to palpation, No CVAT SKIN:  Warm, dry, and intact   Results: Laboratory Data: Lab Results  Component Value Date   WBC 4.7 01/14/2022   HGB 12.9 01/14/2022   HCT 39.1 01/14/2022   MCV 90.7 01/14/2022   PLT 288 01/14/2022    Lab Results  Component Value Date   CREATININE 0.75 07/21/2021    Lab Results  Component Value Date   HGBA1C 5.7 (H) 11/28/2019    Urinalysis    Component Value Date/Time   APPEARANCEUR Clear 04/11/2021 1431   GLUCOSEU Negative 04/11/2021 1431   BILIRUBINUR Negative 04/11/2021 1431   PROTEINUR Negative 04/11/2021 1431   NITRITE Negative 04/11/2021 1431   LEUKOCYTESUR Negative 04/11/2021 1431    Lab Results  Component Value Date   LABMICR See below: 04/11/2021   WBCUA 0-5 04/11/2021   LABEPIT 0-10 04/11/2021   MUCUS Present 10/31/2019   BACTERIA Few 04/11/2021    Pertinent Imaging:  No results found for this or any previous visit.  No results found for this or any previous visit.  No results found for this or any previous visit.  No results found for this or any previous visit.  No results found for this or any previous visit.  No results found for this or any previous visit.  No results found for this or any previous visit.  No results found for this or any previous visit.  Results for orders placed or performed in visit on 02/26/22 (from the past 24 hour(s))  BLADDER SCAN AMB NON-IMAGING   Collection Time: 02/26/22 12:19 PM   Result Value Ref Range   Scan Result 13

## 2022-02-26 NOTE — Progress Notes (Signed)
post void residual =64m

## 2022-02-27 DIAGNOSIS — M25562 Pain in left knee: Secondary | ICD-10-CM | POA: Diagnosis not present

## 2022-03-02 DIAGNOSIS — M25562 Pain in left knee: Secondary | ICD-10-CM | POA: Diagnosis not present

## 2022-03-04 DIAGNOSIS — M25562 Pain in left knee: Secondary | ICD-10-CM | POA: Diagnosis not present

## 2022-03-06 DIAGNOSIS — S83282D Other tear of lateral meniscus, current injury, left knee, subsequent encounter: Secondary | ICD-10-CM | POA: Diagnosis not present

## 2022-03-09 DIAGNOSIS — M25562 Pain in left knee: Secondary | ICD-10-CM | POA: Diagnosis not present

## 2022-03-13 DIAGNOSIS — M25562 Pain in left knee: Secondary | ICD-10-CM | POA: Diagnosis not present

## 2022-03-16 DIAGNOSIS — M25562 Pain in left knee: Secondary | ICD-10-CM | POA: Diagnosis not present

## 2022-03-18 DIAGNOSIS — M25562 Pain in left knee: Secondary | ICD-10-CM | POA: Diagnosis not present

## 2022-03-23 DIAGNOSIS — M25562 Pain in left knee: Secondary | ICD-10-CM | POA: Diagnosis not present

## 2022-03-25 DIAGNOSIS — M25562 Pain in left knee: Secondary | ICD-10-CM | POA: Diagnosis not present

## 2022-03-30 DIAGNOSIS — M25562 Pain in left knee: Secondary | ICD-10-CM | POA: Diagnosis not present

## 2022-04-02 DIAGNOSIS — N958 Other specified menopausal and perimenopausal disorders: Secondary | ICD-10-CM | POA: Diagnosis not present

## 2022-04-02 DIAGNOSIS — Z85828 Personal history of other malignant neoplasm of skin: Secondary | ICD-10-CM | POA: Diagnosis not present

## 2022-04-02 DIAGNOSIS — Z01419 Encounter for gynecological examination (general) (routine) without abnormal findings: Secondary | ICD-10-CM | POA: Diagnosis not present

## 2022-04-02 DIAGNOSIS — Z681 Body mass index (BMI) 19 or less, adult: Secondary | ICD-10-CM | POA: Diagnosis not present

## 2022-04-02 DIAGNOSIS — R319 Hematuria, unspecified: Secondary | ICD-10-CM | POA: Diagnosis not present

## 2022-04-03 DIAGNOSIS — M25562 Pain in left knee: Secondary | ICD-10-CM | POA: Diagnosis not present

## 2022-04-16 DIAGNOSIS — M25562 Pain in left knee: Secondary | ICD-10-CM | POA: Diagnosis not present

## 2022-04-20 ENCOUNTER — Ambulatory Visit: Payer: Medicare PPO | Admitting: Urology

## 2022-04-20 ENCOUNTER — Encounter: Payer: Self-pay | Admitting: Urology

## 2022-04-20 VITALS — BP 132/68 | HR 88

## 2022-04-20 DIAGNOSIS — R3915 Urgency of urination: Secondary | ICD-10-CM | POA: Diagnosis not present

## 2022-04-20 DIAGNOSIS — R3129 Other microscopic hematuria: Secondary | ICD-10-CM | POA: Diagnosis not present

## 2022-04-20 LAB — URINALYSIS, ROUTINE W REFLEX MICROSCOPIC
Bilirubin, UA: NEGATIVE
Glucose, UA: NEGATIVE
Ketones, UA: NEGATIVE
Leukocytes,UA: NEGATIVE
Nitrite, UA: NEGATIVE
Protein,UA: NEGATIVE
Specific Gravity, UA: 1.01 (ref 1.005–1.030)
Urobilinogen, Ur: 0.2 mg/dL (ref 0.2–1.0)
pH, UA: 7.5 (ref 5.0–7.5)

## 2022-04-20 LAB — MICROSCOPIC EXAMINATION
Renal Epithel, UA: NONE SEEN /hpf
WBC, UA: NONE SEEN /hpf (ref 0–5)

## 2022-04-20 NOTE — Patient Instructions (Signed)

## 2022-04-20 NOTE — Progress Notes (Signed)
04/20/2022 1:31 PM   [name] 17-Aug-1956 [id number]  Referring provider: Braham Nation, MD [place],  [place] [address]  Followup urinary urgency and microhematuria   HPI: Ms [name] is a 23FT here for followup for urinary urgency and microhematuria. UA shows 3-10 RBCs/hpf. She has a hx of a negative cystoscopy 2017. She had urinary frequency, urgency 7 weeks ago and was seen by Roney Marion.  She was given rx of mirabegron but did not take the medication. She underwent CT in 10/22 which showed no GU abnormalities. No other complaints.    PMH: Past Medical History:  Diagnosis Date   GERD (gastroesophageal reflux disease)    Hyperlipidemia    Lung nodule    left    Osteopenia    Osteoporosis    Squamous cell skin cancer    Right leg    Surgical History: Past Surgical History:  Procedure Laterality Date   BIOPSY  11/02/2019   Procedure: BIOPSY;  Surgeon: Daneil Dolin, MD;  [facility]: [facility];  Service: Endoscopy;;  gastric   Biopsy right leg Right    skin   COLONOSCOPY N/A 09/17/2017   Dr. Gala Romney: normal colonoscopy.  Next colonoscopy in 10 years.   ESOPHAGOGASTRODUODENOSCOPY (EGD) WITH PROPOFOL N/A 11/02/2019   Dr. Gala Romney: Gastric mucosa coated by thick tenacious bile-stained mucus.  Stomach diffusely injected with scattered focal hemorrhagic erosions but no frank ulcer.  Small hiatal hernia.  Gastric biopsy showed nonspecific reactive gastropathy, no H. pylori.  Duodenum appeared normal.   TONSILLECTOMY      Home Medications:  Allergies as of 04/20/2022       Reactions   Amoxicillin    Severe fatigue  Did it involve swelling of the face/tongue/throat, SOB, or low BP? No Did it involve sudden or severe rash/hives, skin peeling, or any reaction on the inside of your mouth or nose? No Did you need to seek medical attention at a hospital or doctor's office? No When did it last happen?      6 - 7 years If all above answers are "NO", may proceed with  cephalosporin use.   Bactrim [sulfamethoxazole-trimethoprim] Hives   Keflex [cephalexin] Hives   Hydrocodone Nausea Only        Medication List        Accurate as of April 20, 2022  1:31 PM. If you have any questions, ask your nurse or doctor.          ALGAE BASED CALCIUM PO Take by mouth 2 (two) times daily.   COLLAGEN PO Take by mouth daily.   cycloSPORINE 0.05 % ophthalmic emulsion Commonly known as: RESTASIS Place 1 drop into both eyes 2 (two) times daily.   desonide 0.05 % ointment Commonly known as: DESOWEN Apply 1 application. topically as needed.   estradiol 0.1 MG/GM vaginal cream Commonly known as: ESTRACE Place 1 Applicatorful 2 (two) times a week vaginally.   mirabegron ER 25 MG Tb24 tablet Commonly known as: MYRBETRIQ Take 1 tablet (25 mg total) by mouth daily.   multivitamin tablet Take 1 tablet by mouth daily.   NON FORMULARY One scoop of protein powder once daily. One scoop of Barley greens powder once daily.   polyethylene glycol 17 g packet Commonly known as: MIRALAX / GLYCOLAX Take 17 g by mouth daily.   PRO-BIOTIC BLEND PO Take by mouth.   Strontium Chloride Powd by Does not apply route. capsules   valACYclovir 1000 MG tablet Commonly known  as: VALTREX Take 2,000 mg by mouth 2 (two) times daily as needed (fever blisters).   VITA-C PO Take by mouth daily.        Allergies:  Allergies  Allergen Reactions   Amoxicillin     Severe fatigue  Did it involve swelling of the face/tongue/throat, SOB, or low BP? No Did it involve sudden or severe rash/hives, skin peeling, or any reaction on the inside of your mouth or nose? No Did you need to seek medical attention at a hospital or doctor's office? No When did it last happen?      6 - 7 years If all above answers are "NO", may proceed with cephalosporin use.    Bactrim [Sulfamethoxazole-Trimethoprim] Hives   Keflex [Cephalexin] Hives   Hydrocodone Nausea Only    Family  History: Family History  Problem Relation Age of Onset   Osteoporosis Mother    Kidney disease Mother    Autoimmune disease Mother    Leukemia Mother    Parkinson's disease Father    Dementia Father    Kidney disease Father    Stroke Father    Angina Father    Skin cancer Father    Colon cancer Cousin    Parkinson's disease Maternal Grandfather    Heart attack Paternal Grandfather    Cerebral palsy Son     Social History:  reports that she has never smoked. She has never used smokeless tobacco. She reports that she does not drink alcohol and does not use drugs.  ROS: All other review of systems were reviewed and are negative except what is noted above in HPI  Physical Exam: BP 132/68   Pulse 88   Constitutional:  Alert and oriented, No acute distress. HEENT: [place] AT, moist mucus membranes.  Trachea midline, no masses. Cardiovascular: No clubbing, cyanosis, or edema. Respiratory: Normal respiratory effort, no increased work of breathing. GI: Abdomen is soft, nontender, nondistended, no abdominal masses GU: No CVA tenderness.  Lymph: No cervical or inguinal lymphadenopathy. Skin: No rashes, bruises or suspicious lesions. Neurologic: Grossly intact, no focal deficits, moving all 4 extremities. Psychiatric: Normal mood and affect.  Laboratory Data: Lab Results  Component Value Date   WBC 4.7 01/14/2022   HGB 12.9 01/14/2022   HCT 39.1 01/14/2022   MCV 90.7 01/14/2022   PLT 288 01/14/2022    Lab Results  Component Value Date   CREATININE 0.75 07/21/2021    No results found for: "PSA"  No results found for: "TESTOSTERONE"  Lab Results  Component Value Date   HGBA1C 5.7 (H) 11/28/2019    Urinalysis    Component Value Date/Time   APPEARANCEUR Clear 02/26/2022 1416   GLUCOSEU Negative 02/26/2022 1416   BILIRUBINUR Negative 02/26/2022 1416   PROTEINUR Negative 02/26/2022 1416   NITRITE Negative 02/26/2022 1416   LEUKOCYTESUR Negative 02/26/2022 1416    Lab  Results  Component Value Date   LABMICR See below: 02/26/2022   WBCUA 0-5 02/26/2022   LABEPIT 0-10 02/26/2022   MUCUS Present 10/31/2019   BACTERIA None seen 02/26/2022    Pertinent Imaging: CT 10/22 No results found for this or any previous visit.  No results found for this or any previous visit.  No results found for this or any previous visit.  No results found for this or any previous visit.  No results found for this or any previous visit.  No results found for this or any previous visit.  No results found for this or any previous visit.  No results found for this or any previous visit.   Assessment & Plan:    1. Urgency of urination -resolved - Urinalysis, Routine w reflex microscopic  2. Microhematuria -RTC 6 months with UA. If she has persistent microhematuria then we will proceed with cystoscopy  No follow-ups on file.  Nicolette Bang, MD  [facility] [place]

## 2022-04-23 DIAGNOSIS — M25562 Pain in left knee: Secondary | ICD-10-CM | POA: Diagnosis not present

## 2022-04-24 DIAGNOSIS — M25562 Pain in left knee: Secondary | ICD-10-CM | POA: Diagnosis not present

## 2022-04-29 ENCOUNTER — Telehealth: Payer: Self-pay | Admitting: *Deleted

## 2022-04-29 ENCOUNTER — Other Ambulatory Visit: Payer: Self-pay | Admitting: *Deleted

## 2022-04-29 DIAGNOSIS — R2242 Localized swelling, mass and lump, left lower limb: Secondary | ICD-10-CM

## 2022-04-29 NOTE — Telephone Encounter (Signed)
Spoke with patient she is having swelling and heaviness to her LLE. Patient schedule for [place] and appt to see Dr Donzetta Matters

## 2022-04-30 DIAGNOSIS — M25562 Pain in left knee: Secondary | ICD-10-CM | POA: Diagnosis not present

## 2022-05-04 DIAGNOSIS — M79672 Pain in left foot: Secondary | ICD-10-CM | POA: Diagnosis not present

## 2022-05-04 DIAGNOSIS — S83282D Other tear of lateral meniscus, current injury, left knee, subsequent encounter: Secondary | ICD-10-CM | POA: Diagnosis not present

## 2022-05-04 DIAGNOSIS — M19072 Primary osteoarthritis, left ankle and foot: Secondary | ICD-10-CM | POA: Diagnosis not present

## 2022-05-05 ENCOUNTER — Other Ambulatory Visit: Payer: Self-pay | Admitting: *Deleted

## 2022-05-05 DIAGNOSIS — R2242 Localized swelling, mass and lump, left lower limb: Secondary | ICD-10-CM

## 2022-05-07 DIAGNOSIS — M25562 Pain in left knee: Secondary | ICD-10-CM | POA: Diagnosis not present

## 2022-05-08 ENCOUNTER — Ambulatory Visit (HOSPITAL_COMMUNITY)
Admission: RE | Admit: 2022-05-08 | Discharge: 2022-05-08 | Disposition: A | Discharge status: Home / Self Care | Payer: Medicare PPO | Source: Ambulatory Visit | Attending: Vascular Surgery | Admitting: Vascular Surgery

## 2022-05-08 DIAGNOSIS — R2242 Localized swelling, mass and lump, left lower limb: Secondary | ICD-10-CM | POA: Diagnosis not present

## 2022-05-11 ENCOUNTER — Ambulatory Visit: Payer: Self-pay

## 2022-05-11 ENCOUNTER — Encounter: Payer: Self-pay | Admitting: Family Medicine

## 2022-05-11 ENCOUNTER — Ambulatory Visit: Payer: Medicare PPO | Admitting: Family Medicine

## 2022-05-11 VITALS — BP 147/73 | Ht 65.5 in | Wt 108.0 lb

## 2022-05-11 DIAGNOSIS — M25572 Pain in left ankle and joints of left foot: Secondary | ICD-10-CM

## 2022-05-11 NOTE — Patient Instructions (Addendum)
You have insertional achilles tendinopathy with a small haglund's deformity. Topical voltaren gel up to 4 times a day with food for pain and inflammation. Home exercises and stretches are very important. Icing 15 minutes at a time 3-4 times a day as needed. Avoid uneven ground, hills as much as possible. Heel lifts in shoes or shoes with a natural heel lift help but don't force yourself to use these if it causes it to rub more on the back of your shoes. Consider physical therapy, orthotics, nitro patches if not improving as expected. Follow up in 6 weeks.

## 2022-05-11 NOTE — Progress Notes (Signed)
PCP: Rosalee Kaufman, PA-C  Subjective:   HPI: Patient is a 66 y.o. female here for left ankle pain.  Follows with Dr Layne Benton. She has been having pain in her posterior left heel. Had left knee surgery in May 2023 and has been going to physical therapy for this. She has most of her pain when it rubs against shoes, so now she can only wear shoes without a back. Does not hurt with walking. Here for an ultrasound of her Achilles.   Past Medical History:  Diagnosis Date   GERD (gastroesophageal reflux disease)    Hyperlipidemia    Lung nodule    left    Osteopenia    Osteoporosis    Squamous cell skin cancer    Right leg    Current Outpatient Medications on File Prior to Visit  Medication Sig Dispense Refill   Ascorbic Acid (VITA-C PO) Take by mouth daily.     COLLAGEN PO Take by mouth daily.     cycloSPORINE (RESTASIS) 0.05 % ophthalmic emulsion Place 1 drop into both eyes 2 (two) times daily.     desonide (DESOWEN) 0.05 % ointment Apply 1 application. topically as needed.     estradiol (ESTRACE) 0.1 MG/GM vaginal cream Place 1 Applicatorful 2 (two) times a week vaginally.      mirabegron ER (MYRBETRIQ) 25 MG TB24 tablet Take 1 tablet (25 mg total) by mouth daily. 28 tablet 0   Multiple Vitamin (MULTIVITAMIN) tablet Take 1 tablet by mouth daily.     Multiple Vitamins-Minerals (ALGAE BASED CALCIUM PO) Take by mouth 2 (two) times daily.     NON FORMULARY One scoop of protein powder once daily. One scoop of Barley greens powder once daily.     polyethylene glycol (MIRALAX / GLYCOLAX) 17 g packet Take 17 g by mouth daily.     Probiotic Product (PRO-BIOTIC BLEND PO) Take by mouth.     Strontium Chloride POWD by Does not apply route. capsules     valACYclovir (VALTREX) 1000 MG tablet Take 2,000 mg by mouth 2 (two) times daily as needed (fever blisters).      No current facility-administered medications on file prior to visit.    Past Surgical History:  Procedure Laterality Date    BIOPSY  11/02/2019   Procedure: BIOPSY;  Surgeon: Daneil Dolin, MD;  [facility]: [facility];  Service: Endoscopy;;  gastric   Biopsy right leg Right    skin   COLONOSCOPY N/A 09/17/2017   Dr. Gala Romney: normal colonoscopy.  Next colonoscopy in 10 years.   ESOPHAGOGASTRODUODENOSCOPY (EGD) WITH PROPOFOL N/A 11/02/2019   Dr. Gala Romney: Gastric mucosa coated by thick tenacious bile-stained mucus.  Stomach diffusely injected with scattered focal hemorrhagic erosions but no frank ulcer.  Small hiatal hernia.  Gastric biopsy showed nonspecific reactive gastropathy, no H. pylori.  Duodenum appeared normal.   TONSILLECTOMY      Allergies  Allergen Reactions   Amoxicillin     Severe fatigue  Did it involve swelling of the face/tongue/throat, SOB, or low BP? No Did it involve sudden or severe rash/hives, skin peeling, or any reaction on the inside of your mouth or nose? No Did you need to seek medical attention at a hospital or doctor's office? No When did it last happen?      6 - 7 years If all above answers are "NO", may proceed with cephalosporin use.    Bactrim [Sulfamethoxazole-Trimethoprim] Hives   Keflex [Cephalexin] Hives   Hydrocodone Nausea Only  BP (!) 147/73   Ht 5' 5.5" (1.664 m)   Wt 108 lb (49 kg)   BMI 17.70 kg/m       No data to display              No data to display              Objective:  Physical Exam:  Gen: NAD, comfortable in exam room No gross deformity, swelling, ecchymoses FROM Negative ant drawer and negative talar tilt.   Negative syndesmotic compression. Thompsons test negative. NV intact distally.  Limited ultrasound of the left Achilles showed a small 3 mm bone spur at the insertion of her Achilles. Achilles tendon intact without thickening, measuring 0.4 cm.  No neovascularity throughout tendon or at insertion.  Peroneal tendons intact lateral ankle.  Trace effusion and mild ankle arthropathy.  Assessment & Plan:  1. Insertional achilles  tendonopathy with a small haglund's deformity. Recommend Voltaren gel, home exercises, and icing as needed. Can use heel lifts in shoes, though we discussed that this may cause more rubbing on the back of her heel and if so, she should not wear them. Can consider physical therapy, orthotics, or nitro patches if no improvement. Follow up in 6 weeks.  Virgel Manifold, MS4

## 2022-05-15 ENCOUNTER — Encounter: Payer: Self-pay | Admitting: Family Medicine

## 2022-05-20 ENCOUNTER — Encounter: Payer: Self-pay | Admitting: Vascular Surgery

## 2022-05-20 ENCOUNTER — Ambulatory Visit: Payer: Medicare PPO | Admitting: Vascular Surgery

## 2022-05-20 VITALS — BP 114/69 | HR 85 | Temp 98.7°F | Resp 20 | Ht 65.0 in | Wt 107.0 lb

## 2022-05-20 DIAGNOSIS — I872 Venous insufficiency (chronic) (peripheral): Secondary | ICD-10-CM

## 2022-05-20 NOTE — Progress Notes (Signed)
Patient ID: [name], female   DOB: 1956/06/04, 66 y.o.   MRN: [id number]  Reason for Consult: Follow-up   Referred by Rosalee Kaufman, *  Subjective:     HPI:  [name] is a 66 y.o. female previously had a squamous cell carcinoma in the right lower extremity which healed with the wound care center.  She now has varicose veins and swelling in the left lower extremity she does wear compression stockings.  No skin changes.  She has never had any venous intervention or DVTs in the past.  Past Medical History:  Diagnosis Date   GERD (gastroesophageal reflux disease)    Hyperlipidemia    Lung nodule    left    Osteopenia    Osteoporosis    Squamous cell skin cancer    Right leg   Family History  Problem Relation Age of Onset   Osteoporosis Mother    Kidney disease Mother    Autoimmune disease Mother    Leukemia Mother    Parkinson's disease Father    Dementia Father    Kidney disease Father    Stroke Father    Angina Father    Skin cancer Father    Colon cancer Cousin    Parkinson's disease Maternal Grandfather    Heart attack Paternal Grandfather    Cerebral palsy Son    Past Surgical History:  Procedure Laterality Date   BIOPSY  11/02/2019   Procedure: BIOPSY;  Surgeon: Daneil Dolin, MD;  Location: [facility];  Service: Endoscopy;;  gastric   Biopsy right leg Right    skin   COLONOSCOPY N/A 09/17/2017   Dr. Gala Romney: normal colonoscopy.  Next colonoscopy in 10 years.   ESOPHAGOGASTRODUODENOSCOPY (EGD) WITH PROPOFOL N/A 11/02/2019   Dr. Gala Romney: Gastric mucosa coated by thick tenacious bile-stained mucus.  Stomach diffusely injected with scattered focal hemorrhagic erosions but no frank ulcer.  Small hiatal hernia.  Gastric biopsy showed nonspecific reactive gastropathy, no H. pylori.  Duodenum appeared normal.   TONSILLECTOMY      Short Social History:  Social History   Tobacco Use   Smoking status: Never   Smokeless tobacco: Never  Substance Use  Topics   Alcohol use: No    Allergies  Allergen Reactions   Amoxicillin     Severe fatigue  Did it involve swelling of the face/tongue/throat, SOB, or low BP? No Did it involve sudden or severe rash/hives, skin peeling, or any reaction on the inside of your mouth or nose? No Did you need to seek medical attention at a hospital or doctor's office? No When did it last happen?      6 - 7 years If all above answers are "NO", may proceed with cephalosporin use.    Bactrim [Sulfamethoxazole-Trimethoprim] Hives   Keflex [Cephalexin] Hives   Hydrocodone Nausea Only    Current Outpatient Medications  Medication Sig Dispense Refill   Ascorbic Acid (VITA-C PO) Take by mouth daily.     COLLAGEN PO Take by mouth daily.     cycloSPORINE (RESTASIS) 0.05 % ophthalmic emulsion Place 1 drop into both eyes 2 (two) times daily.     estradiol (ESTRACE) 0.1 MG/GM vaginal cream Place 1 Applicatorful 2 (two) times a week vaginally.      Multiple Vitamin (MULTIVITAMIN) tablet Take 1 tablet by mouth daily.     Multiple Vitamins-Minerals (ALGAE BASED CALCIUM PO) Take by mouth 2 (two) times daily.     NON FORMULARY One  scoop of protein powder once daily. One scoop of Barley greens powder once daily.     polyethylene glycol (MIRALAX / GLYCOLAX) 17 g packet Take 17 g by mouth daily.     Probiotic Product (PRO-BIOTIC BLEND PO) Take by mouth.     Strontium Chloride POWD by Does not apply route. capsules     valACYclovir (VALTREX) 1000 MG tablet Take 2,000 mg by mouth 2 (two) times daily as needed (fever blisters).      No current facility-administered medications for this visit.    Review of Systems  Constitutional:  Constitutional negative. HENT: HENT negative.  Eyes: Eyes negative.  Cardiovascular: Positive for leg swelling.  GI: Gastrointestinal negative.  Musculoskeletal: Musculoskeletal negative.  Skin: Skin negative.  Neurological: Neurological negative. Hematologic: Hematologic/lymphatic  negative.  Psychiatric: Psychiatric negative.        Objective:  Objective   Vitals:   05/20/22 1603  BP: 114/69  Pulse: 85  Resp: 20  Temp: 98.7 F (37.1 C)  SpO2: 98%  Weight: 107 lb (48.5 kg)  Height: '5\' 5"'$  (1.651 m)   Body mass index is 17.81 kg/m.  Physical Exam HENT:     Head: Normocephalic.     Nose: Nose normal.  Eyes:     Pupils: Pupils are equal, round, and reactive to light.  Pulmonary:     Effort: Pulmonary effort is normal.  Abdominal:     General: Abdomen is flat.     Palpations: Abdomen is soft.  Musculoskeletal:     Right lower leg: No edema.     Left lower leg: No edema.     Comments: There are a few varicose veins and reticular veins of the left lower extremity Well-healed incision of the right lower extremity  Skin:    General: Skin is warm.     Capillary Refill: Capillary refill takes less than 2 seconds.  Neurological:     General: No focal deficit present.     Mental Status: She is alert.  Psychiatric:        Mood and Affect: Mood normal.        Thought Content: Thought content normal.     Data: LEFT          Reflux NoRefluxReflux TimeDiameter cmsComments                          Yes                                   +--------------+---------+------+-----------+------------+--------+  CFV           no                                              +--------------+---------+------+-----------+------------+--------+  FV mid        no                                              +--------------+---------+------+-----------+------------+--------+  Popliteal     no                                              +--------------+---------+------+-----------+------------+--------+  GSV at [facility]    no                            0.47              +--------------+---------+------+-----------+------------+--------+  GSV prox thighno                            0.32               +--------------+---------+------+-----------+------------+--------+  GSV mid thigh no                            0.32              +--------------+---------+------+-----------+------------+--------+  GSV dist thighno                            0.31              +--------------+---------+------+-----------+------------+--------+  GSV at knee             yes    >500 ms      0.38              +--------------+---------+------+-----------+------------+--------+  GSV prox calf           yes    >500 ms      0.33              +--------------+---------+------+-----------+------------+--------+  SSV Pop Fossa no                            0.28              +--------------+---------+------+-----------+------------+--------+           Summary:  Left:  - No evidence of deep vein thrombosis from the common femoral through the  popliteal veins.  - No evidence of superficial venous thrombosis.  - The deep venous system is competent.  - The great saphenous vein is not competent (knee and proximal calf only).  - The small saphenous vein is competent.      Assessment/Plan:   66 year old female with C2 venous disease left lower extremity with minimal reflux.  I recommended continue compression stockings and elevation when recumbent.  She can follow-up with me on an as-needed basis.  Waynetta Sandy MD [facility]

## 2022-05-25 ENCOUNTER — Ambulatory Visit: Payer: Medicare PPO | Admitting: Family Medicine

## 2022-05-25 VITALS — BP 129/72 | Ht 65.5 in | Wt 107.0 lb

## 2022-05-25 DIAGNOSIS — G8929 Other chronic pain: Secondary | ICD-10-CM | POA: Diagnosis not present

## 2022-05-25 DIAGNOSIS — M25562 Pain in left knee: Secondary | ICD-10-CM | POA: Diagnosis not present

## 2022-05-26 ENCOUNTER — Encounter: Payer: Self-pay | Admitting: Family Medicine

## 2022-05-26 NOTE — Progress Notes (Signed)
PCP: Rosalee Kaufman, PA-C  Subjective:   HPI: Patient is a 66 y.o. female here for left knee pain.  Patient reports she started to get posterior bilateral knee pain last summer. Pain in right knee improved though left knee continued to bother her. Was worse with sitting and driving the March. Then recalls in April feeling something within knee when she was getting into her car and had worse pain after this. She had an MRI which showed PCL tear with flap, lateral meniscus tear. On arthroscopy appeared this was the ACL with partial tear, not PCL and lateral meniscus tear - both were debrided with 30% of lateral meniscus debrided.  She also had a chondroplasty. Knee is still not back to expected level of function. She had steroid injection which did not provide benefit post-operatively. Intermittent sharp pain both medial and lateral. Did 6 weeks of physical therapy 2x/week. Has pain mainly when flexing her knee past 90 degrees. No catching or locking or giving out.  Past Medical History:  Diagnosis Date   GERD (gastroesophageal reflux disease)    Hyperlipidemia    Lung nodule    left    Osteopenia    Osteoporosis    Squamous cell skin cancer    Right leg    Current Outpatient Medications on File Prior to Visit  Medication Sig Dispense Refill   Ascorbic Acid (VITA-C PO) Take by mouth daily.     COLLAGEN PO Take by mouth daily.     cycloSPORINE (RESTASIS) 0.05 % ophthalmic emulsion Place 1 drop into both eyes 2 (two) times daily.     estradiol (ESTRACE) 0.1 MG/GM vaginal cream Place 1 Applicatorful 2 (two) times a week vaginally.      Multiple Vitamin (MULTIVITAMIN) tablet Take 1 tablet by mouth daily.     Multiple Vitamins-Minerals (ALGAE BASED CALCIUM PO) Take by mouth 2 (two) times daily.     NON FORMULARY One scoop of protein powder once daily. One scoop of Barley greens powder once daily.     polyethylene glycol (MIRALAX / GLYCOLAX) 17 g packet Take 17 g by mouth  daily.     Probiotic Product (PRO-BIOTIC BLEND PO) Take by mouth.     Strontium Chloride POWD by Does not apply route. capsules     valACYclovir (VALTREX) 1000 MG tablet Take 2,000 mg by mouth 2 (two) times daily as needed (fever blisters).      No current facility-administered medications on file prior to visit.    Past Surgical History:  Procedure Laterality Date   BIOPSY  11/02/2019   Procedure: BIOPSY;  Surgeon: Daneil Dolin, MD;  Location: [facility];  Service: Endoscopy;;  gastric   Biopsy right leg Right    skin   COLONOSCOPY N/A 09/17/2017   Dr. Gala Romney: normal colonoscopy.  Next colonoscopy in 10 years.   ESOPHAGOGASTRODUODENOSCOPY (EGD) WITH PROPOFOL N/A 11/02/2019   Dr. Gala Romney: Gastric mucosa coated by thick tenacious bile-stained mucus.  Stomach diffusely injected with scattered focal hemorrhagic erosions but no frank ulcer.  Small hiatal hernia.  Gastric biopsy showed nonspecific reactive gastropathy, no H. pylori.  Duodenum appeared normal.   TONSILLECTOMY      Allergies  Allergen Reactions   Amoxicillin     Severe fatigue  Did it involve swelling of the face/tongue/throat, SOB, or low BP? No Did it involve sudden or severe rash/hives, skin peeling, or any reaction on the inside of your mouth or nose? No Did you need to seek medical attention  at a hospital or doctor's office? No When did it last happen?      6 - 7 years If all above answers are "NO", may proceed with cephalosporin use.    Bactrim [Sulfamethoxazole-Trimethoprim] Hives   Keflex [Cephalexin] Hives   Hydrocodone Nausea Only    BP 129/72   Ht 5' 5.5" (1.664 m)   Wt 107 lb (48.5 kg)   BMI 17.53 kg/m       No data to display              No data to display              Objective:  Physical Exam:  Gen: NAD, comfortable in exam room  Left knee: No gross deformity, ecchymoses, swelling. Mild medial and lateral joint line TTP. FROM with normal strength. Negative ant/post drawers.  Negative valgus/varus testing. Negative lachman, lever.  Negative mcmurrays, apleys NV intact distally.   Assessment & Plan:  1. Left knee pain - chronic.  S/p lateral meniscus debridement, ACL partial tear debridement, and chondroplasty performed 01/28/22.  Her exam is reassuring.  I suspect continued pain she is having is still post-operative and related to the chondroplasty she had performed.  She's done extensive physical therapy.  Steroid injection did not provide much benefit.  Continue with her home exercises.  Can consider viscosupplementation, repeat MRI if she does not continue to slowly improve over the next 2-3 months.

## 2022-05-28 ENCOUNTER — Encounter: Payer: Self-pay | Admitting: Family Medicine

## 2022-06-15 DIAGNOSIS — Z20828 Contact with and (suspected) exposure to other viral communicable diseases: Secondary | ICD-10-CM | POA: Diagnosis not present

## 2022-06-24 ENCOUNTER — Ambulatory Visit: Payer: Medicare PPO | Admitting: Family Medicine

## 2022-06-25 DIAGNOSIS — R7301 Impaired fasting glucose: Secondary | ICD-10-CM | POA: Diagnosis not present

## 2022-06-25 DIAGNOSIS — Q782 Osteopetrosis: Secondary | ICD-10-CM | POA: Diagnosis not present

## 2022-06-25 DIAGNOSIS — E782 Mixed hyperlipidemia: Secondary | ICD-10-CM | POA: Diagnosis not present

## 2022-06-25 DIAGNOSIS — E559 Vitamin D deficiency, unspecified: Secondary | ICD-10-CM | POA: Diagnosis not present

## 2022-06-25 DIAGNOSIS — E639 Nutritional deficiency, unspecified: Secondary | ICD-10-CM | POA: Diagnosis not present

## 2022-06-25 DIAGNOSIS — R7303 Prediabetes: Secondary | ICD-10-CM | POA: Diagnosis not present

## 2022-06-25 DIAGNOSIS — E509 Vitamin A deficiency, unspecified: Secondary | ICD-10-CM | POA: Diagnosis not present

## 2022-06-25 DIAGNOSIS — D519 Vitamin B12 deficiency anemia, unspecified: Secondary | ICD-10-CM | POA: Diagnosis not present

## 2022-06-25 DIAGNOSIS — E039 Hypothyroidism, unspecified: Secondary | ICD-10-CM | POA: Diagnosis not present

## 2022-06-29 DIAGNOSIS — R7303 Prediabetes: Secondary | ICD-10-CM | POA: Diagnosis not present

## 2022-06-29 DIAGNOSIS — R7301 Impaired fasting glucose: Secondary | ICD-10-CM | POA: Diagnosis not present

## 2022-06-29 DIAGNOSIS — E630 Essential fatty acid [EFA] deficiency: Secondary | ICD-10-CM | POA: Diagnosis not present

## 2022-06-29 DIAGNOSIS — R030 Elevated blood-pressure reading, without diagnosis of hypertension: Secondary | ICD-10-CM | POA: Diagnosis not present

## 2022-06-29 DIAGNOSIS — R634 Abnormal weight loss: Secondary | ICD-10-CM | POA: Diagnosis not present

## 2022-06-29 DIAGNOSIS — Q782 Osteopetrosis: Secondary | ICD-10-CM | POA: Diagnosis not present

## 2022-06-29 DIAGNOSIS — Z681 Body mass index (BMI) 19 or less, adult: Secondary | ICD-10-CM | POA: Diagnosis not present

## 2022-06-29 DIAGNOSIS — R911 Solitary pulmonary nodule: Secondary | ICD-10-CM | POA: Diagnosis not present

## 2022-06-29 DIAGNOSIS — E782 Mixed hyperlipidemia: Secondary | ICD-10-CM | POA: Diagnosis not present

## 2022-06-29 DIAGNOSIS — E039 Hypothyroidism, unspecified: Secondary | ICD-10-CM | POA: Diagnosis not present

## 2022-06-29 DIAGNOSIS — Z0000 Encounter for general adult medical examination without abnormal findings: Secondary | ICD-10-CM | POA: Diagnosis not present

## 2022-06-29 DIAGNOSIS — E509 Vitamin A deficiency, unspecified: Secondary | ICD-10-CM | POA: Diagnosis not present

## 2022-06-29 DIAGNOSIS — E785 Hyperlipidemia, unspecified: Secondary | ICD-10-CM | POA: Diagnosis not present

## 2022-06-29 DIAGNOSIS — E559 Vitamin D deficiency, unspecified: Secondary | ICD-10-CM | POA: Diagnosis not present

## 2022-06-29 DIAGNOSIS — R319 Hematuria, unspecified: Secondary | ICD-10-CM | POA: Diagnosis not present

## 2022-06-29 DIAGNOSIS — E639 Nutritional deficiency, unspecified: Secondary | ICD-10-CM | POA: Diagnosis not present

## 2022-07-03 ENCOUNTER — Encounter: Payer: Self-pay | Admitting: Family Medicine

## 2022-07-06 ENCOUNTER — Encounter: Payer: Self-pay | Admitting: *Deleted

## 2022-07-08 ENCOUNTER — Ambulatory Visit: Payer: Medicare PPO | Admitting: Family Medicine

## 2022-07-08 ENCOUNTER — Encounter: Payer: Self-pay | Admitting: Family Medicine

## 2022-07-08 VITALS — BP 134/70 | Ht 65.5 in

## 2022-07-08 DIAGNOSIS — M25562 Pain in left knee: Secondary | ICD-10-CM

## 2022-07-08 DIAGNOSIS — G8929 Other chronic pain: Secondary | ICD-10-CM | POA: Diagnosis not present

## 2022-07-08 DIAGNOSIS — M25572 Pain in left ankle and joints of left foot: Secondary | ICD-10-CM

## 2022-07-08 DIAGNOSIS — M25571 Pain in right ankle and joints of right foot: Secondary | ICD-10-CM

## 2022-07-08 MED ORDER — NITROGLYCERIN 0.2 MG/HR TD PT24: MEDICATED_PATCH | TRANSDERMAL | 1 refills | Status: DC

## 2022-07-08 NOTE — Progress Notes (Signed)
PCP: Rosalee Kaufman, PA-C  Subjective:   HPI: Patient is a 66 y.o. female here for bilateral ankle pain, left knee pain.  8/14: Follows with Dr Layne Benton. She has been having pain in her posterior left heel. Had left knee surgery in May 2023 and has been going to physical therapy for this. She has most of her pain when it rubs against shoes, so now she can only wear shoes without a back. Does not hurt with walking. Here for an ultrasound of her Achilles.   8/28: Patient reports she started to get posterior bilateral knee pain last summer. Pain in right knee improved though left knee continued to bother her. Was worse with sitting and driving the March. Then recalls in April feeling something within knee when she was getting into her car and had worse pain after this. She had an MRI which showed PCL tear with flap, lateral meniscus tear. On arthroscopy appeared this was the ACL with partial tear, not PCL and lateral meniscus tear - both were debrided with 30% of lateral meniscus debrided.  She also had a chondroplasty. Knee is still not back to expected level of function. She had steroid injection which did not provide benefit post-operatively. Intermittent sharp pain both medial and lateral. Did 6 weeks of physical therapy 2x/week. Has pain mainly when flexing her knee past 90 degrees. No catching or locking or giving out.  10/11: Patient reports overall she's had mild improvement of her symptoms. However, has had a recent flare of swelling and pain in left knee possibly from doing more walking and pushing than usual. She is wearing knee brace, doing home exercises. Not icing this and achilles as much though. For achilles she's doing home exercises but not advanced to doing on a step yet. Wearing shoes with open back, no heel lifts. Able to walk and use stationary bike some. Noticed small bump that was painful right ankle posteriorly just lateral to achilles.  Past Medical  History:  Diagnosis Date   GERD (gastroesophageal reflux disease)    Hyperlipidemia    Lung nodule    left    Osteopenia    Osteoporosis    Squamous cell skin cancer    Right leg    Current Outpatient Medications on File Prior to Visit  Medication Sig Dispense Refill   Ascorbic Acid (VITA-C PO) Take by mouth daily.     COLLAGEN PO Take by mouth daily.     cycloSPORINE (RESTASIS) 0.05 % ophthalmic emulsion Place 1 drop into both eyes 2 (two) times daily.     estradiol (ESTRACE) 0.1 MG/GM vaginal cream Place 1 Applicatorful 2 (two) times a week vaginally.      Multiple Vitamin (MULTIVITAMIN) tablet Take 1 tablet by mouth daily.     Multiple Vitamins-Minerals (ALGAE BASED CALCIUM PO) Take by mouth 2 (two) times daily.     NON FORMULARY One scoop of protein powder once daily. One scoop of Barley greens powder once daily.     polyethylene glycol (MIRALAX / GLYCOLAX) 17 g packet Take 17 g by mouth daily.     Probiotic Product (PRO-BIOTIC BLEND PO) Take by mouth.     Strontium Chloride POWD by Does not apply route. capsules     valACYclovir (VALTREX) 1000 MG tablet Take 2,000 mg by mouth 2 (two) times daily as needed (fever blisters).      No current facility-administered medications on file prior to visit.    Past Surgical History:  Procedure Laterality Date  BIOPSY  11/02/2019   Procedure: BIOPSY;  Surgeon: Daneil Dolin, MD;  Location: [facility];  Service: Endoscopy;;  gastric   Biopsy right leg Right    skin   COLONOSCOPY N/A 09/17/2017   Dr. Gala Romney: normal colonoscopy.  Next colonoscopy in 10 years.   ESOPHAGOGASTRODUODENOSCOPY (EGD) WITH PROPOFOL N/A 11/02/2019   Dr. Gala Romney: Gastric mucosa coated by thick tenacious bile-stained mucus.  Stomach diffusely injected with scattered focal hemorrhagic erosions but no frank ulcer.  Small hiatal hernia.  Gastric biopsy showed nonspecific reactive gastropathy, no H. pylori.  Duodenum appeared normal.   TONSILLECTOMY      Allergies   Allergen Reactions   Amoxicillin     Severe fatigue  Did it involve swelling of the face/tongue/throat, SOB, or low BP? No Did it involve sudden or severe rash/hives, skin peeling, or any reaction on the inside of your mouth or nose? No Did you need to seek medical attention at a hospital or doctor's office? No When did it last happen?      6 - 7 years If all above answers are "NO", may proceed with cephalosporin use.    Bactrim [Sulfamethoxazole-Trimethoprim] Hives   Keflex [Cephalexin] Hives   Hydrocodone Nausea Only    BP 134/70   Ht 5' 5.5" (1.664 m)   BMI 17.53 kg/m       No data to display              No data to display              Objective:  Physical Exam:  Gen: NAD, comfortable in exam room  Left knee: Mod effusion.  No other gross deformity, ecchymoses. No TTP. FROM with normal strength. Negative ant/post drawers. Negative valgus/varus testing. Negative lachman.  Negative mcmurrays, apleys. NV intact distally.  Left ankle: No gross deformity, swelling, ecchymoses FROM Mild TTP achilles insertion medially.  No other tenderness. Negative calcaneal squeeze. NV intact distally.  Right ankle: Small mobile nodule in soft tissue lateral to achilles.  No other gross deformity, swelling, ecchymoses FROM without pain. TTP over small nodule only. NV intact distally.   Assessment & Plan:  1. Left knee pain - s/p lateral meniscus debridement, ACL partial tear debridement, chondroplasty performed 01/28/22.  Effusion noted today but otherwise reassuring exam.  Continue conservative treatment.  Steroid injection did not provide benefit.  Consider viscosupplementation as next step in future.    2. Left achilles tendinopathy - insertional with haglunds deformity.  Add nitro patches - discussed risks of headaches, skin irritation.  Continue home exercises.  3. Right ankle pain - due to small calcific deposit in soft tissue.  No concerning features, it is  mobile.  She had recent labwork with normal serum calcium.  Monitor.

## 2022-07-08 NOTE — Patient Instructions (Signed)
You have insertional achilles tendinopathy with a small haglund's deformity. Add nitro patches 1/4th patch to affected achilles, change daily. Continue your home exercises and stretches. Icing 15 minutes at a time 3-4 times a day as needed.  Consider viscosupplementation (gel injections) for your left knee in the future if this doesn't continue to slowly improve.  You have a small calcium deposit in your left heel under the skin but this is not dangerous and the pain from this should go away even if the deposit is still there.

## 2022-07-20 NOTE — Telephone Encounter (Signed)
She has not been seen since 09/2021. She will need an office visit. There should be something before 08/12/2022 available. She can see any APP.

## 2022-07-23 DIAGNOSIS — R030 Elevated blood-pressure reading, without diagnosis of hypertension: Secondary | ICD-10-CM | POA: Diagnosis not present

## 2022-07-23 DIAGNOSIS — R109 Unspecified abdominal pain: Secondary | ICD-10-CM | POA: Diagnosis not present

## 2022-07-23 DIAGNOSIS — K429 Umbilical hernia without obstruction or gangrene: Secondary | ICD-10-CM | POA: Diagnosis not present

## 2022-07-23 DIAGNOSIS — R195 Other fecal abnormalities: Secondary | ICD-10-CM | POA: Diagnosis not present

## 2022-07-23 DIAGNOSIS — Z681 Body mass index (BMI) 19 or less, adult: Secondary | ICD-10-CM | POA: Diagnosis not present

## 2022-07-23 DIAGNOSIS — R319 Hematuria, unspecified: Secondary | ICD-10-CM | POA: Diagnosis not present

## 2022-07-24 DIAGNOSIS — R195 Other fecal abnormalities: Secondary | ICD-10-CM | POA: Diagnosis not present

## 2022-07-24 DIAGNOSIS — R319 Hematuria, unspecified: Secondary | ICD-10-CM | POA: Diagnosis not present

## 2022-07-28 ENCOUNTER — Ambulatory Visit: Payer: Medicare PPO | Admitting: Gastroenterology

## 2022-07-29 ENCOUNTER — Encounter: Payer: Self-pay | Admitting: Gastroenterology

## 2022-07-29 ENCOUNTER — Ambulatory Visit: Payer: Medicare PPO | Admitting: Gastroenterology

## 2022-07-29 ENCOUNTER — Encounter: Payer: Self-pay | Admitting: Family Medicine

## 2022-07-29 VITALS — BP 132/75 | HR 86 | Temp 97.7°F | Ht 65.0 in | Wt 106.6 lb

## 2022-07-29 DIAGNOSIS — K8681 Exocrine pancreatic insufficiency: Secondary | ICD-10-CM

## 2022-07-29 MED ORDER — PANCRELIPASE (LIP-PROT-AMYL) 36000-114000 UNITS PO CPEP
ORAL_CAPSULE | ORAL | 5 refills | Status: DC
Start: 2022-07-29 — End: 2022-09-02

## 2022-07-29 NOTE — Progress Notes (Signed)
GI Office Note    Referring Provider: Rosalee Kaufman, * Primary Care Physician:  Rosine Door  Primary Gastroenterologist: Garfield Cornea, MD   Chief Complaint   Chief Complaint  Patient presents with   Follow-up    States that she is still having pain and bloating.     History of Present Illness   [name] is a 66 y.o. female presenting today for follow-up.  Last seen in January 2023.  She has a history of chronic bloating, periumbilical abdominal pain, weight loss.  Patient presents today stating her bloating is the same.  Continues gluten-free diet.  The only thing that she is ever noted to be helpful with regards to her abdominal discomfort.  Previously tried amitriptyline 10 mg at bedtime for 6 weeks but she felt so groggy the next morning but she did not feel like she should stay on it.  She also did not notice significant improvement but may have stopped too early.  Did not feel that she could tolerate higher dose.  Having bowel movements daily but often feels incomplete.  Stools are soft.  Feels like the odor is worse than before.  Notes that she had somewhat more fatty foods than typical over the past couple weeks, eating ground beef.  Her weight is stable but she is frustrated that she cannot gain her weight back.  Continues to feel fatigued.  At times she feels like there is a "pouch" just right of the bellybutton. She wonders if her hernia is worse.  Does not see any distention.  Last week she had increased pain in this area.  Also with increased early satiety.  Notes after pain has improved but only satiety seems to be better as well.  Continues to follow with her functional medicine doctor [name]).  She notes recently she repeated a stool elastase test which was now low at 106.  When checked by our practice previously it was 440.  PCP was able to write for pancreatic enzymes, she picked up the prescription but has not started.  Sed rate and CRP  were normal September 2023.  Abd u/s July 24, 2022: Normal liver, common bile duct 4 mm, normal gallbladder, spleen unremarkable.    CT A/P with contrast 06/2021: IMPRESSION: 1. No acute findings within the abdomen or pelvis. 2. Moderate stool burden identified throughout the colon. Correlate for any clinical signs or symptoms of constipation. 3. Aortic Atherosclerosis (ICD10-I70.0).   From November 2018 through February 2019 seen a couple of times for increased gas, bloating, constipation.  Previous work-up included negative celiac markers. Colonoscopy December 2018 unremarkable.  Symptoms were never debilitating.     In December 2020 however she presented with significant weight loss, patient suspected related to stress around that time, father had passed away and her mother was living in an independent living center and could not take visitors due to [place] pandemic.  Continues to take care of son with cerebral palsy.  She is also had skin cancer requiring Mohs.  She presented with left-sided abdominal pain which occurred after eating frozen store-bought shrimp and rice.  Symptoms associated with excessive burping.  Symptoms lasted for several days.  She was having bowel movements every morning after Benefiber.  Noted increased bloating and periumbilical abdominal discomfort.   CT abdomen pelvis with contrast January 2021 showed prominent stool throughout the colon favoring constipation, reduced aortic-SMA angle at 20 degrees, with some narrowing of the left renal vein  in this facility such that nutcracker syndrome cannot be excluded.  No findings of SMA syndrome.  She was seen by vascular surgeon, Dr. Servando Snare for evaluation, he did not feel that the anatomic abnormality was contributing to her pain syndrome.  Chest x-ray due to weight loss, was negative.  She ended up with CT chest with contrast in April 2021 ordered by oncology for weight loss.  Noted to have emphysema, 6 x 7 mm subpleural  posterior left upper lobe pulmonary nodule.  Undergoing serial CTs with Dr. Delton Coombes.  Also noted to have multiple hypervascular liver lesions measuring up to 1.6 cm not definitively seen on prior CT, potentially related to bolus timing.  MR abdomen with and without contrast completed in May 2021 due to liver lesion.  Noted to have benign hemangioma.   She completed EGD February 2021 that showed gastric mucosa coated by thick tenacious bile stained mucus.  Stomach diffusely injected with scattered focal hemorrhagic erosions but no frank ulcers.  Small hiatal hernia.  Gastric biopsy showed nonspecific reactive gastropathy, no H. pylori.  Duodenum appeared normal.  Last colonoscopy was in December 2018 which was normal.   Celiac serologies negative 2018.  IgA level slightly low.  TTG IgA was <2. TTG IgG was 1.  IgG and IgM normal.  Pancreatic elastase of 440.  She perceives gluten sensitivity and avoids gluten. Was also noted to have an elevated albumin/globulin ratio (2.8 --> 2.67) for which she was referred to hematology, no intervention pursued.  Saw Dr. Jerene Pitch at [facility] for second opinion, no additional thoughts.  He felt like her postprandial symptoms of pain and belching more consistent with dyspepsia plus or minus gastritis.  She weaned off PPI under the direction of Dr. Jerene Pitch due to her concern of osteoporosis.     Saw Dr. Blake Divine for tiny umbilical hernia.  Felt hernia could be repaired with suture only since it is small.  Patient was concerned about downtime postoperatively would interfere with her taking care of her son who has cerebral palsy and requires 24/7 care.   Saw her gynecologist in June 2022, pelvic ultrasound negative.  Pap smear normal.    Medications   Current Outpatient Medications  Medication Sig Dispense Refill   Ascorbic Acid (VITA-C PO) Take by mouth daily.     COLLAGEN PO Take by mouth daily.     cycloSPORINE (RESTASIS) 0.05 % ophthalmic emulsion Place 1  drop into both eyes 2 (two) times daily.     estradiol (ESTRACE) 0.1 MG/GM vaginal cream Place 1 Applicatorful 2 (two) times a week vaginally.      Multiple Vitamin (MULTIVITAMIN) tablet Take 1 tablet by mouth daily.     Multiple Vitamins-Minerals (ALGAE BASED CALCIUM PO) Take by mouth 2 (two) times daily.     nitroGLYCERIN (NITRODUR - DOSED IN MG/24 HR) 0.2 mg/hr patch Apply 1/4th patch to affected achilles, change daily 30 patch 1   NON FORMULARY One scoop of protein powder once daily. One scoop of Barley greens powder once daily.     polyethylene glycol (MIRALAX / GLYCOLAX) 17 g packet Take 17 g by mouth daily.     Probiotic Product (PRO-BIOTIC BLEND PO) Take by mouth.     Strontium Chloride POWD by Does not apply route. capsules     valACYclovir (VALTREX) 1000 MG tablet Take 2,000 mg by mouth 2 (two) times daily as needed (fever blisters).      No current facility-administered medications for this visit.  Allergies   Allergies as of 07/29/2022 - Review Complete 07/29/2022  Allergen Reaction Noted   Amoxicillin  08/12/2017   Bactrim [sulfamethoxazole-trimethoprim] Hives 08/12/2017   Keflex [cephalexin] Hives 08/12/2017   Hydrocodone Nausea Only 08/01/2018      Review of Systems   General: Negative for anorexia, weight loss, fever, chills, +fatigue, weakness. ENT: Negative for hoarseness, difficulty swallowing, nasal congestion. CV: Negative for chest pain, angina, palpitations, dyspnea on exertion, peripheral edema.  Respiratory: Negative for dyspnea at rest, dyspnea on exertion, cough, sputum, wheezing.  GI: See history of present illness. GU:  Negative for dysuria, hematuria, urinary incontinence, urinary frequency, nocturnal urination.  Endo: Negative for unusual weight change.     Physical Exam   BP 132/75 (BP Location: Right Arm, Patient Position: Sitting, Cuff Size: Normal)   Pulse 86   Temp 97.7 F (36.5 C) (Oral)   Ht '5\' 5"'$  (1.651 m)   Wt 106 lb 9.6 oz (48.4 kg)    BMI 17.74 kg/m    General: Well-nourished, well-developed in no acute distress.  Eyes: No icterus. Mouth: Oropharyngeal mucosa moist and pink Lungs: Clear to auscultation bilaterally.  Heart: Regular rate and rhythm, no murmurs rubs or gallops.  Abdomen: Bowel sounds are normal, nontender, nondistended, no hepatosplenomegaly or masses, no abdominal bruits, no rebound or guarding. Tiny umb hernia easily reducible and nontender Rectal: not performed Extremities: No lower extremity edema. No clubbing or deformities. Neuro: Alert and oriented x 4   Skin: Warm and dry, no jaundice.   Psych: Alert and cooperative, normal mood and affect.  Labs   See hpi  Imaging Studies   See hpi  Assessment   Chronic abdominal bloating: incomplete bowel movements at times. Previously did not tolerate Linzess due to diarrhea.  Extensive evaluation as previously noted.  Patient presents today reporting recent repeat stool elastase was low.  She has not started Creon provided by her PCP.  Unfortunately the dose is too low, we discussed weight-based dosing today.  I would encourage her to try the medication to see if it helps with her symptoms.  Patient in agreement.  We will give her a new prescription with appropriate dosing.  PLAN   Start Creon, 4 capsules with meals and 2 capsules with snacks, taking with first bite of food and spreading throughout the meal.  She will use of current prescription which was Creon 12,000.  Prescription for Creon 36,000 take 2 with meals and 1 with snacks sent to pharmacy. Return office visit in 6 weeks.  Laureen Ochs. Bobby Rumpf, [place], [facility]

## 2022-07-29 NOTE — Patient Instructions (Signed)
For Creon, we will have you start at 4 capsules with meals and 2 capsules with snacks. Take with first bite of food and spread throughout your meal. We still have a lot of room to increase dosage but this puts you within the weight based range. I will send in new RX today with dosage change, be aware of paying attention to the new directions.

## 2022-08-04 ENCOUNTER — Telehealth: Payer: Self-pay | Admitting: Gastroenterology

## 2022-08-04 ENCOUNTER — Encounter: Payer: Self-pay | Admitting: Gastroenterology

## 2022-08-04 DIAGNOSIS — K8681 Exocrine pancreatic insufficiency: Secondary | ICD-10-CM | POA: Insufficient documentation

## 2022-08-04 NOTE — Telephone Encounter (Signed)
Patient needs a follow-up office visit in 6 weeks.

## 2022-08-05 ENCOUNTER — Encounter: Payer: Self-pay | Admitting: Family Medicine

## 2022-08-05 ENCOUNTER — Ambulatory Visit: Payer: Medicare PPO | Admitting: Family Medicine

## 2022-08-05 VITALS — BP 122/76 | Ht 65.5 in | Wt 106.0 lb

## 2022-08-05 DIAGNOSIS — M25562 Pain in left knee: Secondary | ICD-10-CM

## 2022-08-05 DIAGNOSIS — G8929 Other chronic pain: Secondary | ICD-10-CM | POA: Diagnosis not present

## 2022-08-06 ENCOUNTER — Encounter: Payer: Self-pay | Admitting: Family Medicine

## 2022-08-06 NOTE — Progress Notes (Signed)
PCP: Rosalee Kaufman, PA-C  Subjective:   HPI: Patient is a 66 y.o. female here for left knee pain.  8/14: Follows with Dr Layne Benton. She has been having pain in her posterior left heel. Had left knee surgery in May 2023 and has been going to physical therapy for this. She has most of her pain when it rubs against shoes, so now she can only wear shoes without a back. Does not hurt with walking. Here for an ultrasound of her Achilles.   8/28: Patient reports she started to get posterior bilateral knee pain last summer. Pain in right knee improved though left knee continued to bother her. Was worse with sitting and driving the March. Then recalls in April feeling something within knee when she was getting into her car and had worse pain after this. She had an MRI which showed PCL tear with flap, lateral meniscus tear. On arthroscopy appeared this was the ACL with partial tear, not PCL and lateral meniscus tear - both were debrided with 30% of lateral meniscus debrided.  She also had a chondroplasty. Knee is still not back to expected level of function. She had steroid injection which did not provide benefit post-operatively. Intermittent sharp pain both medial and lateral. Did 6 weeks of physical therapy 2x/week. Has pain mainly when flexing her knee past 90 degrees. No catching or locking or giving out.  10/11: Patient reports overall she's had mild improvement of her symptoms. However, has had a recent flare of swelling and pain in left knee possibly from doing more walking and pushing than usual. She is wearing knee brace, doing home exercises. Not icing this and achilles as much though. For achilles she's doing home exercises but not advanced to doing on a step yet. Wearing shoes with open back, no heel lifts. Able to walk and use stationary bike some. Noticed small bump that was painful right ankle posteriorly just lateral to achilles.  11/8: Patient reports her left knee  pain has worsened. Has been swelling as well, making it difficult to fully bend and extend her knee. She's been wearing compression hose, doing home exercises. Pain is primarily medial.  Past Medical History:  Diagnosis Date   GERD (gastroesophageal reflux disease)    Hyperlipidemia    Lung nodule    left    Osteopenia    Osteoporosis    Squamous cell skin cancer    Right leg    Current Outpatient Medications on File Prior to Visit  Medication Sig Dispense Refill   Ascorbic Acid (VITA-C PO) Take by mouth daily.     COLLAGEN PO Take by mouth daily.     cycloSPORINE (RESTASIS) 0.05 % ophthalmic emulsion Place 1 drop into both eyes 2 (two) times daily.     estradiol (ESTRACE) 0.1 MG/GM vaginal cream Place 1 Applicatorful 2 (two) times a week vaginally.      lipase/protease/amylase (CREON) 36000 UNITS CPEP capsule Take 2 capsules with meals and 1 capsule with snacks. Take with first bite of food. 240 capsule 5   Multiple Vitamin (MULTIVITAMIN) tablet Take 1 tablet by mouth daily.     Multiple Vitamins-Minerals (ALGAE BASED CALCIUM PO) Take by mouth 2 (two) times daily.     nitroGLYCERIN (NITRODUR - DOSED IN MG/24 HR) 0.2 mg/hr patch Apply 1/4th patch to affected achilles, change daily 30 patch 1   NON FORMULARY One scoop of protein powder once daily. One scoop of Barley greens powder once daily.     polyethylene glycol (  MIRALAX / GLYCOLAX) 17 g packet Take 17 g by mouth daily.     Probiotic Product (PRO-BIOTIC BLEND PO) Take by mouth.     Strontium Chloride POWD by Does not apply route. capsules     valACYclovir (VALTREX) 1000 MG tablet Take 2,000 mg by mouth 2 (two) times daily as needed (fever blisters).      No current facility-administered medications on file prior to visit.    Past Surgical History:  Procedure Laterality Date   BIOPSY  11/02/2019   Procedure: BIOPSY;  Surgeon: Daneil Dolin, MD;  Location: [facility];  Service: Endoscopy;;  gastric   Biopsy right leg  Right    skin   COLONOSCOPY N/A 09/17/2017   Dr. Gala Romney: normal colonoscopy.  Next colonoscopy in 10 years.   ESOPHAGOGASTRODUODENOSCOPY (EGD) WITH PROPOFOL N/A 11/02/2019   Dr. Gala Romney: Gastric mucosa coated by thick tenacious bile-stained mucus.  Stomach diffusely injected with scattered focal hemorrhagic erosions but no frank ulcer.  Small hiatal hernia.  Gastric biopsy showed nonspecific reactive gastropathy, no H. pylori.  Duodenum appeared normal.   TONSILLECTOMY      Allergies  Allergen Reactions   Amoxicillin     Severe fatigue  Did it involve swelling of the face/tongue/throat, SOB, or low BP? No Did it involve sudden or severe rash/hives, skin peeling, or any reaction on the inside of your mouth or nose? No Did you need to seek medical attention at a hospital or doctor's office? No When did it last happen?      6 - 7 years If all above answers are "NO", may proceed with cephalosporin use.    Bactrim [Sulfamethoxazole-Trimethoprim] Hives   Keflex [Cephalexin] Hives   Hydrocodone Nausea Only    BP 122/76   Ht 5' 5.5" (1.664 m)   Wt 106 lb (48.1 kg)   BMI 17.37 kg/m       No data to display              No data to display              Objective:  Physical Exam:  Gen: NAD, comfortable in exam room  Left knee: Mod effusion.  No other gross deformity, ecchymoses. TTP medial joint line. FROM with normal strength. Negative ant/post drawers. Negative valgus/varus testing. Negative lachman.  Negative mcmurrays, apleys. NV intact distally.   Assessment & Plan:  1. Left knee pain - had lateral meniscus debridement, ACL partial tear debridement, and chondroplasty performed 01/28/22.  She continues to struggle with pain and swelling in this knee now 6 months out from this surgery despite physical therapy, home exercises, intraarticular steroid injection.  Will proceed with MRI to assess for new medial meniscus tear, why she has not improved despite 6 months of  conservative treatment.

## 2022-08-10 ENCOUNTER — Encounter: Payer: Self-pay | Admitting: Family Medicine

## 2022-08-12 ENCOUNTER — Ambulatory Visit: Payer: Medicare PPO | Admitting: Gastroenterology

## 2022-08-14 ENCOUNTER — Telehealth: Payer: Self-pay | Admitting: Gastroenterology

## 2022-08-14 NOTE — Telephone Encounter (Signed)
Good afternoon Dr Tarri Glenn  We have received a request from patient wanting to transfer her GI care from Dr Hillery Hunter at [facility] due to wanting a second opinion.  Records are available for review in [facility]. Please review and advise on scheduling.  Thank you

## 2022-08-17 ENCOUNTER — Ambulatory Visit (HOSPITAL_COMMUNITY)
Admission: RE | Admit: 2022-08-17 | Discharge: 2022-08-17 | Disposition: A | Discharge status: Home / Self Care | Payer: Medicare PPO | Source: Ambulatory Visit | Attending: Family Medicine | Admitting: Family Medicine

## 2022-08-17 ENCOUNTER — Ambulatory Visit: Payer: Medicare PPO | Admitting: Family Medicine

## 2022-08-17 DIAGNOSIS — M25562 Pain in left knee: Secondary | ICD-10-CM | POA: Diagnosis not present

## 2022-08-17 DIAGNOSIS — G8929 Other chronic pain: Secondary | ICD-10-CM | POA: Insufficient documentation

## 2022-08-19 ENCOUNTER — Ambulatory Visit: Payer: Medicare PPO | Admitting: Family Medicine

## 2022-08-19 VITALS — BP 136/62 | Ht 65.0 in | Wt 106.0 lb

## 2022-08-19 DIAGNOSIS — M25562 Pain in left knee: Secondary | ICD-10-CM

## 2022-08-19 DIAGNOSIS — G8929 Other chronic pain: Secondary | ICD-10-CM

## 2022-08-19 NOTE — Progress Notes (Signed)
Patient returns to go over MRI results.  She has a moderate effusion but no new meniscus tear.  Mild degenerative changes seen on prior MRI.  Discussed consideration of aspiration/injection if this is bothering her enough, consider also sending fluid for analysis.  She will think about this.  Otherwise compression, icing, elevation, topical medications.

## 2022-08-19 NOTE — Telephone Encounter (Signed)
Patient advise. Left detailed voicemail. My phone did cut out in the middle.

## 2022-08-26 ENCOUNTER — Ambulatory Visit (HOSPITAL_COMMUNITY): Payer: Medicare PPO

## 2022-08-31 ENCOUNTER — Encounter: Payer: Self-pay | Admitting: Family Medicine

## 2022-08-31 ENCOUNTER — Ambulatory Visit: Payer: Medicare PPO | Admitting: Family Medicine

## 2022-08-31 VITALS — BP 126/82 | Ht 65.0 in | Wt 108.0 lb

## 2022-08-31 DIAGNOSIS — G8929 Other chronic pain: Secondary | ICD-10-CM

## 2022-08-31 DIAGNOSIS — M25562 Pain in left knee: Secondary | ICD-10-CM | POA: Diagnosis not present

## 2022-08-31 MED ORDER — METHYLPREDNISOLONE ACETATE 40 MG/ML IJ SUSP
40.0000 mg | Freq: Once | INTRAMUSCULAR | Status: AC
Start: 2022-08-31 — End: 2022-08-31
  Administered 2022-08-31: 40 mg via INTRA_ARTICULAR

## 2022-08-31 NOTE — Progress Notes (Signed)
Patient returned today for knee aspiration/injection we discussed at last visit.  Fluid sent for analysis.  After informed written consent timeout was performed, patient was lying supine on exam table.  Left knee was prepped with alcohol swabs.  Utilizing superolateral approach, 3 mL of lidocaine was used for local anesthesia.  Then using an 18g needle on 50cc syringe,18 mL of clear straw-colored fluid was aspirated from left knee.  Knee was then injected with 3:1 lidocaine:depomedrol.  Patient tolerated procedure well without immediate complications

## 2022-09-02 ENCOUNTER — Other Ambulatory Visit: Payer: Self-pay | Admitting: Gastroenterology

## 2022-09-02 MED ORDER — PANCRELIPASE (LIP-PROT-AMYL) 36000-114000 UNITS PO CPEP
ORAL_CAPSULE | ORAL | 5 refills | Status: DC
Start: 2022-09-02 — End: 2023-07-12

## 2022-09-03 LAB — SYNOVIAL FLUID, CELL COUNT
Eos, Fluid: 0 %
Lining Cells, Synovial: 0 %
Lymphs, Fluid: 90 %
Macrophages Fld: 9 %
Nuc cell # Fld: 59 cells/uL (ref 0–200)
Polys, Fluid: 1 %

## 2022-09-07 ENCOUNTER — Encounter: Payer: Self-pay | Admitting: Family Medicine

## 2022-09-08 NOTE — Telephone Encounter (Signed)
I spoke with [name] at [facility] and his is getting the claim corrected.

## 2022-09-09 ENCOUNTER — Other Ambulatory Visit: Payer: Self-pay

## 2022-09-09 DIAGNOSIS — G8929 Other chronic pain: Secondary | ICD-10-CM

## 2022-09-09 NOTE — Telephone Encounter (Signed)
[name] - please refer her for physical therapy for patellofemoral syndrome and let her know.  Thanks!

## 2022-09-11 ENCOUNTER — Encounter: Payer: Self-pay | Admitting: Family Medicine

## 2022-09-14 ENCOUNTER — Encounter: Payer: Self-pay | Admitting: Gastroenterology

## 2022-09-14 ENCOUNTER — Ambulatory Visit: Payer: Medicare PPO | Admitting: Gastroenterology

## 2022-09-14 ENCOUNTER — Telehealth: Payer: Self-pay | Admitting: *Deleted

## 2022-09-14 VITALS — BP 128/78 | HR 74 | Temp 97.3°F | Ht 65.0 in | Wt 107.0 lb

## 2022-09-14 DIAGNOSIS — R197 Diarrhea, unspecified: Secondary | ICD-10-CM

## 2022-09-14 DIAGNOSIS — K8681 Exocrine pancreatic insufficiency: Secondary | ICD-10-CM | POA: Diagnosis not present

## 2022-09-14 DIAGNOSIS — R140 Abdominal distension (gaseous): Secondary | ICD-10-CM

## 2022-09-14 DIAGNOSIS — R1013 Epigastric pain: Secondary | ICD-10-CM

## 2022-09-14 NOTE — Patient Instructions (Addendum)
CT scan of your abdomen. We will be in touch with results as available.  Continue Creon, 2 with meals, 1 with snacks.  Please complete labs at [facility] at least 3 days before your CT scan is scheduled.

## 2022-09-14 NOTE — Progress Notes (Signed)
GI Office Note    Referring Provider: Rosalee Kaufman, * Primary Care Physician:  Rosine Door  Primary Gastroenterologist: Garfield Cornea, MD   Chief Complaint   Chief Complaint  Patient presents with   Follow-up    States that she feels the same. Still has bloating.     History of Present Illness   [name] is a 66 y.o. female presenting today for follow up chronic bloating, periumbilical pain, weight loss. Last seen 07/2022.   Follows with functional medicine (Dr. Berneta Levins). Earlier this year, repeat stool elastase was low at 106. We checked previously and it was 440. At last ov, she presented on Creon but on subtherapeutic dose based on her weight. We increased her dose to 72,000 units with meals and 36,000 units with snacks.   Does not feel any better on Creon. Feels bloating is worse at the end of the day. Still limits gluten. Mostly eats poultry and fish. Tries to avoid raw salad. Tries not to think about it but she is worried something is wrong. She worries about nutritional deficiency. Ortho says her quads are weaker on left side (surgery 7 months ago). BMs more formed. Still malodorous but some improvement. Fatigue is an issue but she is not sure if this is due to age and caring for her disabled sone.   Abd u/s July 24, 2022: Normal liver, common bile duct 4 mm, normal gallbladder, spleen unremarkable.     CT A/P with contrast 06/2021: IMPRESSION: 1. No acute findings within the abdomen or pelvis. 2. Moderate stool burden identified throughout the colon. Correlate for any clinical signs or symptoms of constipation. 3. Aortic Atherosclerosis (ICD10-I70.0).  Extensive prior work up: From November 2018 through February 2019 seen a couple of times for increased gas, bloating, constipation.  Previous work-up included negative celiac markers. Colonoscopy December 2018 unremarkable.  Symptoms were never debilitating.     In December 2020  however she presented with significant weight loss, patient suspected related to stress around that time, father had passed away and her mother was living in an independent living center and could not take visitors due to [place] pandemic.  Continues to take care of son with cerebral palsy.  She is also had skin cancer requiring Mohs.  She presented with left-sided abdominal pain which occurred after eating frozen store-bought shrimp and rice.  Symptoms associated with excessive burping.  Symptoms lasted for several days.  She was having bowel movements every morning after Benefiber.  Noted increased bloating and periumbilical abdominal discomfort.   CT abdomen pelvis with contrast January 2021 showed prominent stool throughout the colon favoring constipation, reduced aortic-SMA angle at 20 degrees, with some narrowing of the left renal vein in this facility such that nutcracker syndrome cannot be excluded.  No findings of SMA syndrome.  She was seen by vascular surgeon, Dr. Servando Snare for evaluation, he did not feel that the anatomic abnormality was contributing to her pain syndrome.  Chest x-ray due to weight loss, was negative.  She ended up with CT chest with contrast in April 2021 ordered by oncology for weight loss.  Noted to have emphysema, 6 x 7 mm subpleural posterior left upper lobe pulmonary nodule.  Undergoing serial CTs with Dr. Delton Coombes.  Also noted to have multiple hypervascular liver lesions measuring up to 1.6 cm not definitively seen on prior CT, potentially related to bolus timing. MR abdomen with and without contrast completed in May 2021 due  to liver lesion.  Noted to have benign hemangioma.   She completed EGD February 2021 that showed gastric mucosa coated by thick tenacious bile stained mucus.  Stomach diffusely injected with scattered focal hemorrhagic erosions but no frank ulcers.  Small hiatal hernia.  Gastric biopsy showed nonspecific reactive gastropathy, no H. pylori. Duodenum  appeared normal.  Last colonoscopy was in December 2018 which was normal.   Celiac serologies negative 2018.  IgA level slightly low.  TTG IgA was <2. TTG IgG was 1.  IgG and IgM normal.  Pancreatic elastase of 440.  She perceives gluten sensitivity and avoids gluten. Was also noted to have an elevated albumin/globulin ratio (2.8 --> 2.67) for which she was referred to hematology, no intervention pursued.  Saw Dr. Jerene Pitch at [facility] for second opinion, no additional thoughts.  He felt like her postprandial symptoms of pain and belching more consistent with dyspepsia plus or minus gastritis.  She weaned off PPI under the direction of Dr. Jerene Pitch due to her concern of osteoporosis.     Saw Dr. Blake Divine for tiny umbilical hernia.  Felt hernia could be repaired with suture only since it is small.  Patient was concerned about downtime postoperatively would interfere with her taking care of her son who has cerebral palsy and requires 24/7 care.   Saw her gynecologist in June 2022, pelvic ultrasound negative.  Pap smear normal.    Medications   Current Outpatient Medications  Medication Sig Dispense Refill   Ascorbic Acid (VITA-C PO) Take by mouth daily.     COLLAGEN PO Take by mouth daily.     cycloSPORINE (RESTASIS) 0.05 % ophthalmic emulsion Place 1 drop into both eyes 2 (two) times daily.     estradiol (ESTRACE) 0.1 MG/GM vaginal cream Place 1 Applicatorful 2 (two) times a week vaginally.      lipase/protease/amylase (CREON) 36000 UNITS CPEP capsule Take 2 capsules with meals and 1 capsule with snacks. Up to 10 per day. Take with first bite of food. 300 capsule 5   Multiple Vitamin (MULTIVITAMIN) tablet Take 1 tablet by mouth daily.     Multiple Vitamins-Minerals (ALGAE BASED CALCIUM PO) Take by mouth 2 (two) times daily.     nitroGLYCERIN (NITRODUR - DOSED IN MG/24 HR) 0.2 mg/hr patch Apply 1/4th patch to affected achilles, change daily 30 patch 1   NON FORMULARY One scoop of protein  powder once daily. One scoop of Barley greens powder once daily.     polyethylene glycol (MIRALAX / GLYCOLAX) 17 g packet Take 17 g by mouth daily.     Probiotic Product (PRO-BIOTIC BLEND PO) Take by mouth.     Strontium Chloride POWD by Does not apply route. capsules     valACYclovir (VALTREX) 1000 MG tablet Take 2,000 mg by mouth 2 (two) times daily as needed (fever blisters).      No current facility-administered medications for this visit.    Allergies   Allergies as of 09/14/2022 - Review Complete 09/14/2022  Allergen Reaction Noted   Amoxicillin  08/12/2017   Bactrim [sulfamethoxazole-trimethoprim] Hives 08/12/2017   Keflex [cephalexin] Hives 08/12/2017   Hydrocodone Nausea Only 08/01/2018     Review of Systems   General: Negative for anorexia, weight loss, fever, chills,  weakness. Cannot gain weight back, fatigue ENT: Negative for hoarseness, difficulty swallowing , nasal congestion. CV: Negative for chest pain, angina, palpitations, dyspnea on exertion, peripheral edema.  Respiratory: Negative for dyspnea at rest, dyspnea on exertion, cough, sputum, wheezing.  GI: See history of present illness. GU:  Negative for dysuria, hematuria, urinary incontinence, urinary frequency, nocturnal urination.  Endo: Negative for unusual weight change.     Physical Exam   BP 128/78 (BP Location: Right Arm, Patient Position: Sitting, Cuff Size: Normal)   Pulse 74   Temp (!) 97.3 F (36.3 C) (Temporal)   Ht '5\' 5"'$  (1.651 m)   Wt 107 lb (48.5 kg)   BMI 17.81 kg/m    General: Well-nourished, well-developed in no acute distress.  Eyes: No icterus. Mouth: Oropharyngeal mucosa moist and pink  Abdomen: Bowel sounds are normal,  nondistended, no hepatosplenomegaly or masses, no abdominal bruits or hernia , no rebound or guarding. Epigastric tenderness Rectal: not performed Extremities: No lower extremity edema. No clubbing or deformities. Neuro: Alert and oriented x 4   Skin:  no  jaundice.   Psych: Alert and cooperative, normal mood and affect.  Labs   Lab Results  Component Value Date   CREATININE 0.78 09/14/2022   BUN 14 09/14/2022   NA 139 09/14/2022   K 4.3 09/14/2022   CL 99 09/14/2022   CO2 26 09/14/2022   Lab Results  Component Value Date   ALT 16 09/14/2022   AST 17 09/14/2022   ALKPHOS 84 09/14/2022   BILITOT 0.3 09/14/2022   Lab Results  Component Value Date   WBC 4.7 01/14/2022   HGB 12.9 01/14/2022   HCT 39.1 01/14/2022   MCV 90.7 01/14/2022   PLT 288 01/14/2022   Lab Results  Component Value Date   IRON 49 12/19/2019   TIBC 361 12/19/2019   FERRITIN 89 12/19/2019   See hpi  Imaging Studies   MR Knee Left  Wo Contrast  Result Date: 08/18/2022 CLINICAL DATA:  Chronic left knee pain. Patient underwent lateral meniscal debridement, debridement of partial ACL tear and chondroplasty 01/28/2022. Persistent pain and swelling. EXAM: MRI OF THE LEFT KNEE WITHOUT CONTRAST TECHNIQUE: Multiplanar, multisequence MR imaging of the knee was performed. No intravenous contrast was administered. COMPARISON:  MRI 12/19/2021 FINDINGS: MENISCI Medial meniscus:  Intact with normal morphology. Lateral meniscus: Interval debridement of the previously demonstrated tear of the anterior horn. No recurrent meniscal tear or displaced meniscal fragment identified. LIGAMENTS Cruciates: The ACL is intact. No definite residual abnormality of the PCL identified. Collaterals:  Intact. CARTILAGE Patellofemoral: Mild lateral patellar chondral thinning and surface irregularity without full-thickness defect or subchondral signal abnormality. Medial:  Preserved. Lateral:  Preserved. MISCELLANEOUS Joint: Moderate-sized joint effusion, increased in volume from previous study. Popliteal Fossa: The popliteus muscle and tendon are intact. Small Baker's cyst. Extensor Mechanism:  Intact. Bones:  No acute or significant extra-articular osseous findings. Other: Mild postoperative  scarring in Hoffa's fat. IMPRESSION: 1. Interval debridement of the previously demonstrated tear of the anterior horn of the lateral meniscus. No recurrent meniscal tear or displaced meniscal fragment identified. 2. The medial meniscus, cruciate and collateral ligaments are intact. 3. Moderate-sized joint effusion and small Baker's cyst, increased in volume from previous study. 4. No acute osseous findings. Electronically Signed   By: Richardean Sale M.D.   On: 08/18/2022 15:53    Assessment   Chronic epigastric pain/bloating: extensive evaluation as outlined. Really no improvement with interventions as outlined. Tried amitriptyline '10mg'$  at bedtime briefly but she was too groggy the following day and did not want to continue. Previously was on PPI chronically but was weaned off remotely due to history of osteoporosis. Seen by vascular for possible Nutcracker's syndrome but it was felt  that she did not have symptoms consistent with these diagnoses. Due to ongoing symptoms, recommend updating labs and imaging. She will likely need referral to GI at an academic center such as [facility] or [facility].  EPI: has noted stools more formed on pancreatic enzymes but otherwise no improvement in bloating and pain. Notably she had normal pancreatic elastase in 2021 over 400 and then on repeat testing this year levels low at 100. Etiology unclear.    PLAN   CT A/P with contrast.  Continue Creon at current dose. Labs as outlined.  If labs and CT unremarkable, she is agreeable to go to academic center for further evaluation. I would recommend [facility] or [facility].   Laureen Ochs. Bobby Rumpf, [place], [facility]

## 2022-09-14 NOTE — Telephone Encounter (Signed)
[facility] PA for CT: Tracking [phone]

## 2022-09-15 LAB — COMPREHENSIVE METABOLIC PANEL
ALT: 16 IU/L (ref 0–32)
AST: 17 IU/L (ref 0–40)
Albumin/Globulin Ratio: 2.6 — ABNORMAL HIGH (ref 1.2–2.2)
Albumin: 4.6 g/dL (ref 3.9–4.9)
Alkaline Phosphatase: 84 IU/L (ref 44–121)
BUN/Creatinine Ratio: 18 (ref 12–28)
BUN: 14 mg/dL (ref 8–27)
Bilirubin Total: 0.3 mg/dL (ref 0.0–1.2)
CO2: 26 mmol/L (ref 20–29)
Calcium: 9.5 mg/dL (ref 8.7–10.3)
Chloride: 99 mmol/L (ref 96–106)
Creatinine, Ser: 0.78 mg/dL (ref 0.57–1.00)
Globulin, Total: 1.8 g/dL (ref 1.5–4.5)
Glucose: 96 mg/dL (ref 70–99)
Potassium: 4.3 mmol/L (ref 3.5–5.2)
Sodium: 139 mmol/L (ref 134–144)
Total Protein: 6.4 g/dL (ref 6.0–8.5)
eGFR: 84 mL/min/{1.73_m2} (ref >59)

## 2022-09-15 LAB — PROTIME-INR
INR: 1 (ref 0.9–1.2)
Prothrombin Time: 10.6 s (ref 9.1–12.0)

## 2022-09-15 LAB — HEPATIC FUNCTION PANEL: Bilirubin, Direct: 0.13 mg/dL (ref 0.00–0.40)

## 2022-09-15 LAB — VITAMIN D 25 HYDROXY (VIT D DEFICIENCY, FRACTURES): Vit D, 25-Hydroxy: 62.5 ng/mL (ref 30.0–100.0)

## 2022-09-15 LAB — LIPASE: Lipase: 61 U/L (ref 14–72)

## 2022-09-16 NOTE — Therapy (Unsigned)
OUTPATIENT PHYSICAL THERAPY LOWER EXTREMITY EVALUATION   Patient Name: [name] MRN: [id number] DOB:02/15/1956, 66 y.o., female Today's Date: 09/17/2022  END OF SESSION:   Past Medical History:  Diagnosis Date   GERD (gastroesophageal reflux disease)    Hyperlipidemia    Lung nodule    left    Osteopenia    Osteoporosis    Squamous cell skin cancer    Right leg   Past Surgical History:  Procedure Laterality Date   BIOPSY  11/02/2019   Procedure: BIOPSY;  Surgeon: Daneil Dolin, MD;  Location: [facility];  Service: Endoscopy;;  gastric   Biopsy right leg Right    skin   COLONOSCOPY N/A 09/17/2017   Dr. Gala Romney: normal colonoscopy.  Next colonoscopy in 10 years.   ESOPHAGOGASTRODUODENOSCOPY (EGD) WITH PROPOFOL N/A 11/02/2019   Dr. Gala Romney: Gastric mucosa coated by thick tenacious bile-stained mucus.  Stomach diffusely injected with scattered focal hemorrhagic erosions but no frank ulcer.  Small hiatal hernia.  Gastric biopsy showed nonspecific reactive gastropathy, no H. pylori.  Duodenum appeared normal.   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 87/23/2021   Gastritis and gastroduodenitis 12/06/2019   Left sided abdominal pain 09/20/2019   Burping 09/20/2019   Loss of weight 09/20/2019   Change in bowel function 08/12/2017   Constipation 08/12/2017   Bloating 08/12/2017    PCP: Rosalee Kaufman, PA-C   REFERRING PROVIDER: Dene Gentry, MD   REFERRING DIAG: [phone] (ICD-10-CM) - Chronic pain of left knee   THERAPY DIAG: Chronic pain of left knee   Rationale for Evaluation and Treatment: Rehabilitation  ONSET DATE: 01/28/22  SUBJECTIVE:   SUBJECTIVE STATEMENT: Reports L knee effusion and  inability to fully flex or extend L knee, discomfort with stairs and pushing sons WC  PERTINENT HISTORY: 1. Left knee pain - had lateral meniscus debridement, ACL partial tear debridement, and chondroplasty performed 01/28/22.  She continues to struggle with pain and swelling in this knee now 6 months out from this surgery despite physical therapy, home exercises, intraarticular steroid injection.  Will proceed with MRI to assess for new medial meniscus tear, why she has not improved despite 6 months of conservative treatment. PAIN:  Are you having pain? Yes: NPRS scale: 8/10 Pain location: L knee pain Pain description: ache Aggravating factors: stair climbing and pushing her son's WC Relieving factors: rest  PRECAUTIONS: None  WEIGHT BEARING RESTRICTIONS: No  FALLS:  Has patient fallen in last 6 months? No  LIVING ENVIRONMENT: Lives with: lives with their family  OCCUPATION: not working  PLOF: Independent  PATIENT GOALS: To decrease and manage my knee pain  NEXT MD VISIT:   OBJECTIVE:   DIAGNOSTIC FINDINGS: CLINICAL DATA:  Chronic left knee pain. Patient underwent lateral meniscal debridement, debridement of partial ACL tear and chondroplasty 01/28/2022. Persistent pain and swelling.   EXAM: MRI OF THE LEFT KNEE WITHOUT CONTRAST   TECHNIQUE: Multiplanar, multisequence MR imaging of the knee was performed. No intravenous contrast was administered.   COMPARISON:  MRI 12/19/2021   FINDINGS: MENISCI   Medial meniscus:  Intact with normal morphology.   Lateral meniscus: Interval debridement of the previously demonstrated tear of the anterior horn. No recurrent meniscal tear or displaced meniscal  fragment identified.   LIGAMENTS   Cruciates: The ACL is intact. No definite residual abnormality of the PCL identified.   Collaterals:  Intact.   CARTILAGE   Patellofemoral: Mild lateral patellar chondral thinning and surface irregularity without full-thickness defect  or subchondral signal abnormality.   Medial:  Preserved.   Lateral:  Preserved.   MISCELLANEOUS   Joint: Moderate-sized joint effusion, increased in volume from previous study.   Popliteal Fossa: The popliteus muscle and tendon are intact. Small Baker's cyst.   Extensor Mechanism:  Intact.   Bones:  No acute or significant extra-articular osseous findings.   Other: Mild postoperative scarring in Hoffa's fat.   IMPRESSION: 1. Interval debridement of the previously demonstrated tear of the anterior horn of the lateral meniscus. No recurrent meniscal tear or displaced meniscal fragment identified. 2. The medial meniscus, cruciate and collateral ligaments are intact. 3. Moderate-sized joint effusion and small Baker's cyst, increased in volume from previous study. 4. No acute osseous findings.     Electronically Signed   By: Richardean Sale M.D.   On: 08/18/2022 15:53  PATIENT SURVEYS:  FOTO 45(59 predicted)  COGNITION: Overall cognitive status: Within functional limits for tasks assessed    EDEMA:  L knee effusion   MUSCLE LENGTH: Hamstrings: Right 80 deg; Left 80 deg Thomas test: negative  POSTURE: No Significant postural limitations  PALPATION: Lateral L patellar pull with quad contraction   LOWER EXTREMITY ROM:  A/PROM Right eval Left eval  Hip flexion [facility]  Hip extension [facility]  Hip abduction [facility]  Hip adduction    Hip internal rotation    Hip external rotation    Knee flexion [facility] 110/116  Knee extension [facility] -6/-18d  Ankle dorsiflexion    Ankle plantarflexion    Ankle inversion    Ankle eversion     (Blank rows = not tested)  LOWER EXTREMITY MMT:  MMT Right eval Left eval  Hip flexion    Hip extension    Hip abduction    Hip adduction    Hip internal rotation    Hip external rotation    Knee flexion 5 4+  Knee extension 5 4(TKE)  Ankle dorsiflexion    Ankle plantarflexion    Ankle inversion    Ankle eversion     (Blank  rows = not tested)  LOWER EXTREMITY SPECIAL TESTS:  Knee special tests: Anterior drawer test: negative, Lachman Test: negative, Lateral pull sign: positive , and Patellafemoral grind test: negative  FUNCTIONAL TESTS:  5 times sit to stand: TBD  GAIT: Distance walked: 42f x2 Assistive device utilized: None Level of assistance: Complete Independence Comments: unremarkable   TODAY'S TREATMENT:                                                                                                                              DATE: 09/17/22  Eval and HEP   PATIENT EDUCATION:  Education details: Discussed eval findings,  rehab rationale and POC and patient is in agreement  Person educated: Patient Education method: Explanation Education comprehension: verbalized understanding and needs further education  HOME EXERCISE PROGRAM: Access Code: [email or web address] URL: [email or web address] Date: 09/17/2022 Prepared by: Sharlynn Oliphant  Exercises - Supine Quad Set  - 2 x daily - 5 x weekly - 2 sets - 10 reps - 3s hold - Active Straight Leg Raise with Quad Set  - 2 x daily - 5 x weekly - 2 sets - 10 reps - Sidelying Hip Abduction  - 2 x daily - 5 x weekly - 2 sets - 10 reps  ASSESSMENT:  CLINICAL IMPRESSION: Patient is a 66 y.o. female who was seen today for physical therapy evaluation and treatment for L knee pain and stiffness.   OBJECTIVE IMPAIRMENTS: Abnormal gait, decreased activity tolerance, decreased balance, decreased knowledge of condition, decreased ROM, decreased strength, pain, and effusion .   ACTIVITY LIMITATIONS: carrying, lifting, standing, hygiene/grooming, caring for others, and pushing son's WC  PERSONAL FACTORS: Age and Past/current experiences are also affecting patient's functional outcome.   REHAB POTENTIAL: Fair based on previous unsuccessful conservative treatments   CLINICAL DECISION MAKING: Evolving/moderate complexity  EVALUATION COMPLEXITY:  Moderate   GOALS: Goals reviewed with patient? No  SHORT TERM GOALS: Target date: 10/08/2022   Patient to demonstrate independence in HEP Baseline:[id number] Goal status: INITIAL  2.  Perform 5x STS test and establish goal as needed Baseline: TBD Goal status: INITIAL   LONG TERM GOALS: Target date: 10/29/2022    -3d AROM L knee extension Baseline: -6d AROM extension Goal status: INITIAL  2.  4+/5 L quad strength in TKE Baseline: 4/5 strength in TKE L Goal status: INITIAL  3.  4/10 worst pain Baseline: 8/10  Goal status: INITIAL  4.  Negative lateral L patellar pull sign Baseline: Positive L patellar pull sign Goal status: INITIAL  5.  Increase FOTO score to 59 Baseline: 45 Goal status: INITIAL    PLAN:  PT FREQUENCY: 2x/week  PT DURATION: 6 weeks  PLANNED INTERVENTIONS: Therapeutic exercises, Therapeutic activity, Neuromuscular re-education, Balance training, Gait training, Patient/Family education, Self Care, Joint mobilization, Stair training, Manual therapy, and Re-evaluation  PLAN FOR NEXT SESSION: HEP review and update, ROM, quad strengthening, flexibility, aerobic training   Lanice Shirts, PT 09/17/2022, 4:06 PM   Referring diagnosis? L knee pain Treatment diagnosis? (if different than referring diagnosis) L knee pain What was this (referring dx) caused by? '[x]'$  Surgery '[]'$  Fall '[x]'$  Ongoing issue '[]'$  Arthritis '[]'$  Other: ____________  Laterality: '[]'$  Rt '[x]'$  Lt '[]'$  Both  Check all possible CPT codes:  *CHOOSE 10 OR LESS*    '[x]'$  97110 (Therapeutic Exercise)  '[]'$  92507 (SLP Treatment)  '[x]'$  97112 (Neuro Re-ed)   '[]'$  92526 (Swallowing Treatment)   '[x]'$  97116 (Gait Training)   '[]'$  [id number] (Cognitive Training, 1st 15 minutes) '[x]'$  97140 (Manual Therapy)   '[]'$  97130 (Cognitive Training, each add'l 15 minutes)  '[x]'$  97164 (Re-evaluation)                              '[]'$  Other, List CPT Code ____________  '[x]'$  47425 (Therapeutic Activities)     '[x]'$  95638 (Self  Care)   '[x]'$  All codes above (97110 - 97535)  '[]'$  97012 (Mechanical Traction)  '[]'$  97014 (E-stim Unattended)  '[]'$  97032 (E-stim manual)  '[]'$  97033 (Ionto)  '[]'$  97035 (Ultrasound) '[]'$  97750 (Physical Performance Training) '[]'$  [id number] (Aquatic Therapy) '[]'$  97016 (Vasopneumatic  Device) '[]'$  [id number] (Paraffin) '[]'$  97034 (Contrast Bath) '[]'$  [phone] (Wound Care 1st 20 sq cm) '[]'$  97598 (Wound Care each add'l 20 sq cm) '[]'$  86381 (Orthotic Fabrication, Fitting, Training Initial) '[]'$  [id number] (Prosthetic Management and Training Initial) '[]'$  77116 (Orthotic or Prosthetic Training/ Modification Subsequent)

## 2022-09-17 ENCOUNTER — Encounter: Payer: Self-pay | Admitting: Family Medicine

## 2022-09-17 ENCOUNTER — Ambulatory Visit: Payer: Medicare PPO | Attending: Family Medicine

## 2022-09-17 DIAGNOSIS — M25562 Pain in left knee: Secondary | ICD-10-CM | POA: Diagnosis not present

## 2022-09-17 DIAGNOSIS — M6281 Muscle weakness (generalized): Secondary | ICD-10-CM | POA: Diagnosis not present

## 2022-09-17 DIAGNOSIS — G8929 Other chronic pain: Secondary | ICD-10-CM | POA: Diagnosis not present

## 2022-09-22 ENCOUNTER — Encounter: Payer: Self-pay | Admitting: Family Medicine

## 2022-09-24 DIAGNOSIS — R030 Elevated blood-pressure reading, without diagnosis of hypertension: Secondary | ICD-10-CM | POA: Diagnosis not present

## 2022-09-24 DIAGNOSIS — M7660 Achilles tendinitis, unspecified leg: Secondary | ICD-10-CM | POA: Diagnosis not present

## 2022-09-24 DIAGNOSIS — Z681 Body mass index (BMI) 19 or less, adult: Secondary | ICD-10-CM | POA: Diagnosis not present

## 2022-09-29 NOTE — Therapy (Unsigned)
OUTPATIENT PHYSICAL THERAPY TREATMENT NOTE   Patient Name: [name] MRN: [id number] DOB:11/29/1955, 67 y.o., female Today's Date: 09/30/2022  PCP: Rosalee Kaufman, PA-C   REFERRING PROVIDER: Dene Gentry, MD    END OF SESSION:   PT End of Session - 09/30/22 1445     Visit Number 2    Number of Visits 12    Date for PT Re-Evaluation 11/12/22    Authorization Type humana    PT Start Time 1445    PT Stop Time 1525    PT Time Calculation (min) 40 min             Past Medical History:  Diagnosis Date   GERD (gastroesophageal reflux disease)    Hyperlipidemia    Lung nodule    left    Osteopenia    Osteoporosis    Squamous cell skin cancer    Right leg   Past Surgical History:  Procedure Laterality Date   BIOPSY  11/02/2019   Procedure: BIOPSY;  Surgeon: Daneil Dolin, MD;  [facility];  Service: Endoscopy;;  gastric   Biopsy right leg Right    skin   COLONOSCOPY N/A 09/17/2017   Dr. Gala Romney: normal colonoscopy.  Next colonoscopy in 10 years.   ESOPHAGOGASTRODUODENOSCOPY (EGD) WITH PROPOFOL N/A 11/02/2019   Dr. Gala Romney: Gastric mucosa coated by thick tenacious bile-stained mucus.  Stomach diffusely injected with scattered focal hemorrhagic erosions but no frank ulcer.  Small hiatal hernia.  Gastric biopsy showed nonspecific reactive gastropathy, no H. pylori.  Duodenum appeared normal.   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 Left knee pain - had lateral meniscus debridement, ACL partial tear debridement, and chondroplasty performed 01/28/22.  She continues to struggle with pain and swelling in this knee now 6 months out from this surgery despite physical therapy, home exercises, intraarticular steroid injection.  Will proceed with MRI to assess for new medial meniscus tear, why she has not improved despite 6 months of conservative treatment.   PRECAUTIONS: none  SUBJECTIVE:  SUBJECTIVE STATEMENT:  Having concurrent pain in L heel from ongoing achilles tendinopathy. L knee feels good today, no pain to report   PAIN:  Are you having pain? Yes: NPRS scale: 7/10 Pain location: L knee  Pain description: ache Aggravating factors: prolonged activity/positions Relieving factors: position changes and rest   OBJECTIVE: (objective measures completed at initial evaluation unless otherwise dated)   DIAGNOSTIC FINDINGS: CLINICAL DATA:  Chronic left knee pain. Patient underwent lateral meniscal debridement, debridement of partial ACL tear and chondroplasty 01/28/2022. Persistent pain and swelling.   EXAM: MRI OF THE LEFT KNEE WITHOUT CONTRAST   TECHNIQUE: Multiplanar, multisequence MR imaging of the knee was performed. No intravenous contrast was administered.   COMPARISON:  MRI 12/19/2021   FINDINGS: MENISCI   Medial meniscus:  Intact with normal morphology.   Lateral meniscus: Interval debridement of the previously demonstrated tear of the anterior horn. No recurrent meniscal tear or displaced meniscal fragment identified.   LIGAMENTS   Cruciates: The ACL is intact.  No definite residual abnormality of the PCL identified.   Collaterals:  Intact.   CARTILAGE   Patellofemoral: Mild lateral patellar chondral thinning and surface irregularity without full-thickness defect or subchondral signal abnormality.   Medial:  Preserved.   Lateral:  Preserved.   MISCELLANEOUS   Joint: Moderate-sized joint effusion, increased in volume from previous study.   Popliteal Fossa: The popliteus muscle and tendon are intact. Small Baker's cyst.   Extensor Mechanism:  Intact.   Bones:  No acute or significant extra-articular osseous findings.   Other: Mild postoperative scarring in Hoffa's fat.   IMPRESSION: 1. Interval debridement of the previously demonstrated tear of the anterior horn of the lateral meniscus. No recurrent meniscal tear or displaced meniscal fragment identified. 2. The medial meniscus, cruciate and collateral ligaments are intact. 3. Moderate-sized joint effusion and small Baker's cyst, increased in volume from previous study. 4. No acute osseous findings.     Electronically Signed   By: Richardean Sale M.D.   On: 08/18/2022 15:53   PATIENT SURVEYS:  FOTO 45(59 predicted)   COGNITION: Overall cognitive status: Within functional limits for tasks assessed                EDEMA:  L knee effusion    MUSCLE LENGTH: Hamstrings: Right 80 deg; Left 80 deg Thomas test: negative   POSTURE: No Significant postural limitations   PALPATION: Lateral L patellar pull with quad contraction    LOWER EXTREMITY ROM:   A/PROM Right eval Left eval  Hip flexion [facility]  Hip extension [facility]  Hip abduction [facility]  Hip adduction      Hip internal rotation      Hip external rotation      Knee flexion [facility] 110/116  Knee extension [facility] -6/-18d  Ankle dorsiflexion      Ankle plantarflexion      Ankle inversion      Ankle eversion       (Blank rows = not tested)   LOWER EXTREMITY MMT:   MMT Right eval Left eval  Hip flexion       Hip extension      Hip abduction      Hip adduction      Hip internal rotation      Hip external rotation      Knee flexion 5 4+  Knee extension 5 4(TKE)  Ankle dorsiflexion      Ankle plantarflexion      Ankle inversion  Ankle eversion       (Blank rows = not tested)   LOWER EXTREMITY SPECIAL TESTS:  Knee special tests: Anterior drawer test: negative, Lachman Test: negative, Lateral pull sign: positive , and Patellafemoral grind test: negative   FUNCTIONAL TESTS:  5 times sit to stand: TBD   GAIT: Distance walked: 45f x2 Assistive device utilized: None Level of assistance: Complete Independence Comments: unremarkable     TODAY'S TREATMENT:            [facility] Adult PT Treatment:                                                DATE: 09/30/22 Therapeutic Exercise: Nustep L2 6 min QS's over heel block 3s hold 15x SAQs 2# 15x L abduction 2# 15x L SLR 2# 15x Bridge with ball 15x L clams with PT stabilization 15x Supine hip fallouts GTB 15x FAQs with adduction 3s hold 15x TKEs GTB 15x                                                                                                                  DATE: 09/17/22  Eval and HEP     PATIENT EDUCATION:  Education details: Discussed eval findings, rehab rationale and POC and patient is in agreement  Person educated: Patient Education method: Explanation Education comprehension: verbalized understanding and needs further education   HOME EXERCISE PROGRAM: Access Code: [email or web address]: [email or web address] Date: 09/17/2022 Prepared by: JSharlynn Oliphant  Exercises - Supine Quad Set  - 2 x daily - 5 x weekly - 2 sets - 10 reps - 3s hold - Active Straight Leg Raise with Quad Set  - 2 x daily - 5 x weekly - 2 sets - 10 reps - Sidelying Hip Abduction  - 2 x daily - 5 x weekly - 2 sets - 10 reps   ASSESSMENT:   CLINICAL IMPRESSION: Today's session focused on ROM as well as TKE and quad activation.  Added L hip  abduction strengthening with focus on gluteus medius.  Added weight as noted to increase difficulty and challenge patient .     OBJECTIVE IMPAIRMENTS: Abnormal gait, decreased activity tolerance, decreased balance, decreased knowledge of condition, decreased ROM, decreased strength, pain, and effusion .    ACTIVITY LIMITATIONS: carrying, lifting, standing, hygiene/grooming, caring for others, and pushing son's WC   PERSONAL FACTORS: Age and Past/current experiences are also affecting patient's functional outcome.    REHAB POTENTIAL: Fair based on previous unsuccessful conservative treatments    CLINICAL DECISION MAKING: Evolving/moderate complexity   EVALUATION COMPLEXITY: Moderate     GOALS: Goals reviewed with patient? No   SHORT TERM GOALS: Target date: 10/08/2022   Patient to demonstrate independence in HEP Baseline:[id number] Goal status: INITIAL   2.  Perform 5x STS test and establish goal as needed Baseline: TBD Goal status: INITIAL  LONG TERM GOALS: Target date: 10/29/2022     -3d AROM L knee extension Baseline: -6d AROM extension Goal status: INITIAL   2.  4+/5 L quad strength in TKE Baseline: 4/5 strength in TKE L Goal status: INITIAL   3.  4/10 worst pain Baseline: 8/10  Goal status: INITIAL   4.  Negative lateral L patellar pull sign Baseline: Positive L patellar pull sign Goal status: INITIAL   5.  Increase FOTO score to 59 Baseline: 45 Goal status: INITIAL       PLAN:   PT FREQUENCY: 2x/week   PT DURATION: 6 weeks   PLANNED INTERVENTIONS: Therapeutic exercises, Therapeutic activity, Neuromuscular re-education, Balance training, Gait training, Patient/Family education, Self Care, Joint mobilization, Stair training, Manual therapy, and Re-evaluation   PLAN FOR NEXT SESSION: HEP review and update, ROM, quad strengthening, flexibility, aerobic training   Lanice Shirts, PT 09/30/2022, 3:58 PM

## 2022-09-30 ENCOUNTER — Ambulatory Visit: Payer: Medicare PPO | Attending: Family Medicine

## 2022-09-30 DIAGNOSIS — M6281 Muscle weakness (generalized): Secondary | ICD-10-CM | POA: Diagnosis present

## 2022-09-30 DIAGNOSIS — M25562 Pain in left knee: Secondary | ICD-10-CM | POA: Diagnosis present

## 2022-09-30 DIAGNOSIS — G8929 Other chronic pain: Secondary | ICD-10-CM

## 2022-10-01 ENCOUNTER — Encounter: Payer: Self-pay | Admitting: Family Medicine

## 2022-10-06 ENCOUNTER — Ambulatory Visit (HOSPITAL_COMMUNITY)
Admission: RE | Admit: 2022-10-06 | Discharge: 2022-10-06 | Disposition: A | Discharge status: Home / Self Care | Payer: Medicare PPO | Source: Ambulatory Visit | Attending: Gastroenterology | Admitting: Gastroenterology

## 2022-10-06 DIAGNOSIS — K8681 Exocrine pancreatic insufficiency: Secondary | ICD-10-CM | POA: Insufficient documentation

## 2022-10-06 DIAGNOSIS — R109 Unspecified abdominal pain: Secondary | ICD-10-CM | POA: Diagnosis not present

## 2022-10-06 DIAGNOSIS — R140 Abdominal distension (gaseous): Secondary | ICD-10-CM | POA: Insufficient documentation

## 2022-10-06 DIAGNOSIS — R1013 Epigastric pain: Secondary | ICD-10-CM | POA: Diagnosis not present

## 2022-10-06 DIAGNOSIS — I700 Atherosclerosis of aorta: Secondary | ICD-10-CM | POA: Diagnosis not present

## 2022-10-06 DIAGNOSIS — R197 Diarrhea, unspecified: Secondary | ICD-10-CM | POA: Diagnosis not present

## 2022-10-06 MED ORDER — IOHEXOL 300 MG/ML  SOLN
100.0000 mL | Freq: Once | INTRAMUSCULAR | Status: AC | PRN
  Administered 2022-10-06: 100 mL via INTRAVENOUS

## 2022-10-06 MED ORDER — SODIUM CHLORIDE (PF) 0.9 % IJ SOLN
INTRAMUSCULAR | Status: AC
  Filled 2022-10-06: qty 50

## 2022-10-07 ENCOUNTER — Ambulatory Visit (INDEPENDENT_AMBULATORY_CARE_PROVIDER_SITE_OTHER): Payer: Medicare PPO | Admitting: Family Medicine

## 2022-10-07 ENCOUNTER — Ambulatory Visit: Payer: Medicare PPO

## 2022-10-07 VITALS — BP 126/80 | Ht 65.0 in | Wt 107.0 lb

## 2022-10-07 DIAGNOSIS — M79672 Pain in left foot: Secondary | ICD-10-CM | POA: Diagnosis not present

## 2022-10-07 DIAGNOSIS — M25562 Pain in left knee: Secondary | ICD-10-CM | POA: Diagnosis not present

## 2022-10-07 DIAGNOSIS — M6281 Muscle weakness (generalized): Secondary | ICD-10-CM

## 2022-10-07 DIAGNOSIS — G8929 Other chronic pain: Secondary | ICD-10-CM | POA: Diagnosis not present

## 2022-10-07 NOTE — Patient Instructions (Signed)
Use the heel cup for at least the next couple weeks to minimize rubbing on this area. Icing 15 minutes at a time 3-4 times a day. Don't worry about the redness here. If bothering you despite the cups we can try 5/16 inch heel lifts to take pressure off the achilles. Continue with your rehab for your knee. Message me through [place] for follow-up!

## 2022-10-07 NOTE — Therapy (Signed)
OUTPATIENT PHYSICAL THERAPY TREATMENT NOTE   Patient Name: [name] MRN: [id number] DOB:03-08-1956, 67 y.o., female Today's Date: 10/07/2022  PCP: Rosine Door   REFERRING PROVIDER: Dene Gentry, MD    END OF SESSION:   PT End of Session - 10/07/22 1134     Visit Number 3    Number of Visits 12    Date for PT Re-Evaluation 11/12/22    Authorization Type humana    PT Start Time 1130    PT Stop Time 1210    PT Time Calculation (min) 40 min    Activity Tolerance Patient tolerated treatment well    Behavior During Therapy WFL for tasks assessed/performed             Past Medical History:  Diagnosis Date   GERD (gastroesophageal reflux disease)    Hyperlipidemia    Lung nodule    left    Osteopenia    Osteoporosis    Squamous cell skin cancer    Right leg   Past Surgical History:  Procedure Laterality Date   BIOPSY  11/02/2019   Procedure: BIOPSY;  Surgeon: Daneil Dolin, MD;  [facility];  Service: Endoscopy;;  gastric   Biopsy right leg Right    skin   COLONOSCOPY N/A 09/17/2017   Dr. Gala Romney: normal colonoscopy.  Next colonoscopy in 10 years.   ESOPHAGOGASTRODUODENOSCOPY (EGD) WITH PROPOFOL N/A 11/02/2019   Dr. Gala Romney: Gastric mucosa coated by thick tenacious bile-stained mucus.  Stomach diffusely injected with scattered focal hemorrhagic erosions but no frank ulcer.  Small hiatal hernia.  Gastric biopsy showed nonspecific reactive gastropathy, no H. pylori.  Duodenum appeared normal.   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 Left knee pain - had lateral meniscus debridement, ACL partial tear debridement, and chondroplasty performed 01/28/22.  She continues to struggle with pain and swelling in this knee now 6 months out from this surgery despite physical therapy, home exercises, intraarticular steroid injection.  Will proceed with MRI to assess for new medial meniscus tear, why she has not improved despite 6 months of conservative treatment.   PRECAUTIONS: none  SUBJECTIVE:  SUBJECTIVE STATEMENT:  Some popping in L knee but not painful, feels better at times when it does pop.  Has been compliant with HEP and quad contraction on heel strike   PAIN:  Are you having pain? Yes: NPRS scale: 7/10 Pain location: L knee  Pain description: ache Aggravating factors: prolonged activity/positions Relieving factors: position changes and rest   OBJECTIVE: (objective measures completed at initial evaluation unless otherwise dated)   DIAGNOSTIC FINDINGS: CLINICAL DATA:  Chronic left knee pain. Patient underwent lateral meniscal debridement, debridement of partial ACL tear and chondroplasty 01/28/2022. Persistent pain and swelling.   EXAM: MRI OF THE LEFT KNEE WITHOUT CONTRAST   TECHNIQUE: Multiplanar, multisequence MR imaging of the knee was performed. No intravenous contrast was administered.   COMPARISON:  MRI 12/19/2021   FINDINGS: MENISCI   Medial meniscus:  Intact with normal morphology.   Lateral meniscus: Interval debridement of the previously demonstrated  tear of the anterior horn. No recurrent meniscal tear or displaced meniscal fragment identified.   LIGAMENTS   Cruciates: The ACL is intact. No definite residual abnormality of the PCL identified.   Collaterals:  Intact.   CARTILAGE   Patellofemoral: Mild lateral patellar chondral thinning and surface irregularity without full-thickness defect or subchondral signal abnormality.   Medial:  Preserved.   Lateral:  Preserved.   MISCELLANEOUS   Joint: Moderate-sized joint effusion, increased in volume from previous study.   Popliteal Fossa: The popliteus muscle and tendon are intact. Small Baker's cyst.   Extensor Mechanism:  Intact.   Bones:  No acute or significant extra-articular osseous findings.   Other: Mild postoperative scarring in Hoffa's fat.   IMPRESSION: 1. Interval debridement of the previously demonstrated tear of the anterior horn of the lateral meniscus. No recurrent meniscal tear or displaced meniscal fragment identified. 2. The medial meniscus, cruciate and collateral ligaments are intact. 3. Moderate-sized joint effusion and small Baker's cyst, increased in volume from previous study. 4. No acute osseous findings.     Electronically Signed   By: Richardean Sale M.D.   On: 08/18/2022 15:53   PATIENT SURVEYS:  FOTO 45(59 predicted)   COGNITION: Overall cognitive status: Within functional limits for tasks assessed                EDEMA:  L knee effusion    MUSCLE LENGTH: Hamstrings: Right 80 deg; Left 80 deg Thomas test: negative   POSTURE: No Significant postural limitations   PALPATION: Lateral L patellar pull with quad contraction    LOWER EXTREMITY ROM:   A/PROM Right eval Left eval  Hip flexion [facility]  Hip extension [facility]  Hip abduction [facility]  Hip adduction      Hip internal rotation      Hip external rotation      Knee flexion [facility] 110/116  Knee extension [facility] -6/-18d  Ankle dorsiflexion      Ankle plantarflexion       Ankle inversion      Ankle eversion       (Blank rows = not tested)   LOWER EXTREMITY MMT:   MMT Right eval Left eval  Hip flexion      Hip extension      Hip abduction      Hip adduction      Hip internal rotation      Hip external rotation      Knee flexion 5 4+  Knee extension 5 4(TKE)  Ankle dorsiflexion      Ankle  plantarflexion      Ankle inversion      Ankle eversion       (Blank rows = not tested)   LOWER EXTREMITY SPECIAL TESTS:  Knee special tests: Anterior drawer test: negative, Lachman Test: negative, Lateral pull sign: positive , and Patellafemoral grind test: negative   FUNCTIONAL TESTS:  5 times sit to stand: TBD   GAIT: Distance walked: 67f x2 Assistive device utilized: None Level of assistance: Complete Independence Comments: unremarkable     TODAY'S TREATMENT:     [facility] Adult PT Treatment:                                                DATE: 10/07/22 Therapeutic Exercise: Nustep L3 8 min QS's over heel block 3s hold 15x Heel raise over 4 in block 15x Runners step from 4 in block 10/10 Step downs from 2 in block 10/10 SAQs 2.5# 15x L abduction 2.5# 15x L SLR 2.5# 15x Seated hamstring stretch 30x 2       [facility] Adult PT Treatment:                                                DATE: 09/30/22 Therapeutic Exercise: Nustep L2 6 min QS's over heel block 3s hold 15x SAQs 2# 15x L abduction 2# 15x L SLR 2# 15x Bridge with ball 15x L clams with PT stabilization 15x Supine hip fallouts GTB 15x FAQs with adduction 3s hold 15x TKEs GTB 15x                                                                                                                  DATE: 09/17/22  Eval and HEP     PATIENT EDUCATION:  Education details: Discussed eval findings, rehab rationale and POC and patient is in agreement  Person educated: Patient Education method: Explanation Education comprehension: verbalized understanding and needs further education   HOME  EXERCISE PROGRAM: Access Code: [email or web address]: [email or web address] Date: 09/17/2022 Prepared by: JSharlynn Oliphant  Exercises - Supine Quad Set  - 2 x daily - 5 x weekly - 2 sets - 10 reps - 3s hold - Active Straight Leg Raise with Quad Set  - 2 x daily - 5 x weekly - 2 sets - 10 reps - Sidelying Hip Abduction  - 2 x daily - 5 x weekly - 2 sets - 10 reps   ASSESSMENT:   CLINICAL IMPRESSION: Increase weight and resistance as noted.  Continued extensor lag with SLR and SAQs.  Incorporated functional tasks including step downs and runners step to emphasize functional patterns.     OBJECTIVE IMPAIRMENTS: Abnormal gait, decreased activity tolerance, decreased balance, decreased knowledge of condition, decreased ROM, decreased strength, pain, and effusion .  ACTIVITY LIMITATIONS: carrying, lifting, standing, hygiene/grooming, caring for others, and pushing son's WC   PERSONAL FACTORS: Age and Past/current experiences are also affecting patient's functional outcome.    REHAB POTENTIAL: Fair based on previous unsuccessful conservative treatments    CLINICAL DECISION MAKING: Evolving/moderate complexity   EVALUATION COMPLEXITY: Moderate     GOALS: Goals reviewed with patient? No   SHORT TERM GOALS: Target date: 10/08/2022   Patient to demonstrate independence in HEP Baseline:[id number] Goal status: INITIAL   2.  Perform 5x STS test and establish goal as needed Baseline: TBD Goal status: INITIAL     LONG TERM GOALS: Target date: 10/29/2022     -3d AROM L knee extension Baseline: -6d AROM extension Goal status: INITIAL   2.  4+/5 L quad strength in TKE Baseline: 4/5 strength in TKE L Goal status: INITIAL   3.  4/10 worst pain Baseline: 8/10  Goal status: INITIAL   4.  Negative lateral L patellar pull sign Baseline: Positive L patellar pull sign Goal status: INITIAL   5.  Increase FOTO score to 59 Baseline: 45 Goal status: INITIAL       PLAN:   PT  FREQUENCY: 2x/week   PT DURATION: 6 weeks   PLANNED INTERVENTIONS: Therapeutic exercises, Therapeutic activity, Neuromuscular re-education, Balance training, Gait training, Patient/Family education, Self Care, Joint mobilization, Stair training, Manual therapy, and Re-evaluation   PLAN FOR NEXT SESSION: HEP review and update, ROM, quad strengthening, flexibility, aerobic training   Lanice Shirts, PT 10/07/2022, 12:27 PM

## 2022-10-08 ENCOUNTER — Encounter: Payer: Self-pay | Admitting: Family Medicine

## 2022-10-08 DIAGNOSIS — Z1231 Encounter for screening mammogram for malignant neoplasm of breast: Secondary | ICD-10-CM | POA: Diagnosis not present

## 2022-10-08 NOTE — Progress Notes (Signed)
PCP: Rosalee Kaufman, PA-C  Subjective:   HPI: Patient is a 67 y.o. female here for left heel pain.  Patient reports over christmas she started to notice a little soreness and developed redness posterior left heel. No acute injury or trauma. She has been doing some home exercises and stretches. Tried icing, compression, resting. Stopped nitro patches. No fever.  Past Medical History:  Diagnosis Date   GERD (gastroesophageal reflux disease)    Hyperlipidemia    Lung nodule    left    Osteopenia    Osteoporosis    Squamous cell skin cancer    Right leg    Current Outpatient Medications on File Prior to Visit  Medication Sig Dispense Refill   Ascorbic Acid (VITA-C PO) Take by mouth daily.     COLLAGEN PO Take by mouth daily.     cycloSPORINE (RESTASIS) 0.05 % ophthalmic emulsion Place 1 drop into both eyes 2 (two) times daily.     estradiol (ESTRACE) 0.1 MG/GM vaginal cream Place 1 Applicatorful 2 (two) times a week vaginally.      lipase/protease/amylase (CREON) 36000 UNITS CPEP capsule Take 2 capsules with meals and 1 capsule with snacks. Up to 10 per day. Take with first bite of food. 300 capsule 5   Multiple Vitamin (MULTIVITAMIN) tablet Take 1 tablet by mouth daily.     Multiple Vitamins-Minerals (ALGAE BASED CALCIUM PO) Take by mouth 2 (two) times daily.     nitroGLYCERIN (NITRODUR - DOSED IN MG/24 HR) 0.2 mg/hr patch Apply 1/4th patch to affected achilles, change daily 30 patch 1   NON FORMULARY One scoop of protein powder once daily. One scoop of Barley greens powder once daily.     polyethylene glycol (MIRALAX / GLYCOLAX) 17 g packet Take 17 g by mouth daily.     Probiotic Product (PRO-BIOTIC BLEND PO) Take by mouth.     Strontium Chloride POWD by Does not apply route. capsules     valACYclovir (VALTREX) 1000 MG tablet Take 2,000 mg by mouth 2 (two) times daily as needed (fever blisters).      No current facility-administered medications on file prior to visit.     Past Surgical History:  Procedure Laterality Date   BIOPSY  11/02/2019   Procedure: BIOPSY;  Surgeon: Daneil Dolin, MD;  [facility];  Service: Endoscopy;;  gastric   Biopsy right leg Right    skin   COLONOSCOPY N/A 09/17/2017   Dr. Gala Romney: normal colonoscopy.  Next colonoscopy in 10 years.   ESOPHAGOGASTRODUODENOSCOPY (EGD) WITH PROPOFOL N/A 11/02/2019   Dr. Gala Romney: Gastric mucosa coated by thick tenacious bile-stained mucus.  Stomach diffusely injected with scattered focal hemorrhagic erosions but no frank ulcer.  Small hiatal hernia.  Gastric biopsy showed nonspecific reactive gastropathy, no H. pylori.  Duodenum appeared normal.   TONSILLECTOMY      Allergies  Allergen Reactions   Amoxicillin     Severe fatigue  Did it involve swelling of the face/tongue/throat, SOB, or low BP? No Did it involve sudden or severe rash/hives, skin peeling, or any reaction on the inside of your mouth or nose? No Did you need to seek medical attention at a hospital or doctor's office? No When did it last happen?      6 - 7 years If all above answers are "NO", may proceed with cephalosporin use.    Bactrim [Sulfamethoxazole-Trimethoprim] Hives   Keflex [Cephalexin] Hives   Hydrocodone Nausea Only    BP 126/80  Ht '5\' 5"'$  (1.651 m)   Wt 107 lb (48.5 kg)   BMI 17.81 kg/m       No data to display              No data to display              Objective:  Physical Exam:  Gen: NAD, comfortable in exam room  Left ankle: Mild redness lateral posterior heel.  No warmth or swelling.   FROM ankle without reproduction of pain. Mild TTP posterior heel. Negative calcaneal squeeze. Thompsons test negative. NV intact distally.   Limited MSK u/s left ankle:  achilles tendon normal thickness without tears or swelling.  No neovascularity of soft tissue or within achilles.  Assessment & Plan:  1. Left heel pain - Improving.  Not due to cellulitis.  Ultrasound reassuring.  Heel  cup to help with irritation of heel.  Icing.  Consider heel lifts if not improving with the cups.  Message [place] with updates on her status.

## 2022-10-12 ENCOUNTER — Ambulatory Visit: Payer: Medicare PPO

## 2022-10-12 DIAGNOSIS — G8929 Other chronic pain: Secondary | ICD-10-CM

## 2022-10-12 DIAGNOSIS — M6281 Muscle weakness (generalized): Secondary | ICD-10-CM

## 2022-10-12 DIAGNOSIS — M25562 Pain in left knee: Secondary | ICD-10-CM | POA: Diagnosis not present

## 2022-10-12 NOTE — Therapy (Signed)
OUTPATIENT PHYSICAL THERAPY TREATMENT NOTE   Patient Name: [name] MRN: [id number] DOB:Feb 24, 1956, 67 y.o., female Today's Date: 10/12/2022  PCP: Rosalee Kaufman, PA-C   REFERRING PROVIDER: Dene Gentry, MD    END OF SESSION:   PT End of Session - 10/12/22 1441     Visit Number 4    Number of Visits 12    Date for PT Re-Evaluation 11/12/22    Authorization Type humana    PT Start Time 1440    PT Stop Time 1520    PT Time Calculation (min) 40 min    Activity Tolerance Patient tolerated treatment well    Behavior During Therapy WFL for tasks assessed/performed             Past Medical History:  Diagnosis Date   GERD (gastroesophageal reflux disease)    Hyperlipidemia    Lung nodule    left    Osteopenia    Osteoporosis    Squamous cell skin cancer    Right leg   Past Surgical History:  Procedure Laterality Date   BIOPSY  11/02/2019   Procedure: BIOPSY;  Surgeon: Daneil Dolin, MD;  [facility]: [facility];  Service: Endoscopy;;  gastric   Biopsy right leg Right    skin   COLONOSCOPY N/A 09/17/2017   Dr. Gala Romney: normal colonoscopy.  Next colonoscopy in 10 years.   ESOPHAGOGASTRODUODENOSCOPY (EGD) WITH PROPOFOL N/A 11/02/2019   Dr. Gala Romney: Gastric mucosa coated by thick tenacious bile-stained mucus.  Stomach diffusely injected with scattered focal hemorrhagic erosions but no frank ulcer.  Small hiatal hernia.  Gastric biopsy showed nonspecific reactive gastropathy, no H. pylori.  Duodenum appeared normal.   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 Left knee pain - had lateral meniscus debridement, ACL partial tear debridement, and chondroplasty performed 01/28/22.  She continues to struggle with pain and swelling in this knee now 6 months out from this surgery despite physical therapy, home exercises, intraarticular steroid injection.  Will proceed with MRI to assess for new medial meniscus tear, why she has not improved despite 6 months of conservative treatment.   PRECAUTIONS: none  SUBJECTIVE:  SUBJECTIVE STATEMENT:  L knee has been doing well, no pain to note today.  Did experience brief episodes of discomfort in L hip, knee and heel at separate times over the weekend.   PAIN:  Are you having pain? Yes: NPRS scale: 7/10 Pain location: L knee  Pain description: ache Aggravating factors: prolonged activity/positions Relieving factors: position changes and rest   OBJECTIVE: (objective measures completed at initial evaluation unless otherwise dated)   DIAGNOSTIC FINDINGS: CLINICAL DATA:  Chronic left knee pain. Patient underwent lateral meniscal debridement, debridement of partial ACL tear and chondroplasty 01/28/2022. Persistent pain and swelling.   EXAM: MRI OF THE LEFT KNEE WITHOUT CONTRAST   TECHNIQUE: Multiplanar, multisequence MR imaging of the knee was performed. No intravenous contrast was administered.   COMPARISON:  MRI 12/19/2021   FINDINGS: MENISCI   Medial meniscus:  Intact with normal morphology.   Lateral meniscus: Interval debridement of the  previously demonstrated tear of the anterior horn. No recurrent meniscal tear or displaced meniscal fragment identified.   LIGAMENTS   Cruciates: The ACL is intact. No definite residual abnormality of the PCL identified.   Collaterals:  Intact.   CARTILAGE   Patellofemoral: Mild lateral patellar chondral thinning and surface irregularity without full-thickness defect or subchondral signal abnormality.   Medial:  Preserved.   Lateral:  Preserved.   MISCELLANEOUS   Joint: Moderate-sized joint effusion, increased in volume from previous study.   Popliteal Fossa: The popliteus muscle and tendon are intact. Small Baker's cyst.   Extensor Mechanism:  Intact.   Bones:  No acute or significant extra-articular osseous findings.   Other: Mild postoperative scarring in Hoffa's fat.   IMPRESSION: 1. Interval debridement of the previously demonstrated tear of the anterior horn of the lateral meniscus. No recurrent meniscal tear or displaced meniscal fragment identified. 2. The medial meniscus, cruciate and collateral ligaments are intact. 3. Moderate-sized joint effusion and small Baker's cyst, increased in volume from previous study. 4. No acute osseous findings.     Electronically Signed   By: Richardean Sale M.D.   On: 08/18/2022 15:53   PATIENT SURVEYS:  FOTO 45(59 predicted)   COGNITION: Overall cognitive status: Within functional limits for tasks assessed                EDEMA:  L knee effusion    MUSCLE LENGTH: Hamstrings: Right 80 deg; Left 80 deg Thomas test: negative   POSTURE: No Significant postural limitations   PALPATION: Lateral L patellar pull with quad contraction    LOWER EXTREMITY ROM:   A/PROM Right eval Left eval  Hip flexion [facility]  Hip extension [facility]  Hip abduction [facility]  Hip adduction      Hip internal rotation      Hip external rotation      Knee flexion [facility] 110/116  Knee extension [facility] -6/-18d  Ankle dorsiflexion       Ankle plantarflexion      Ankle inversion      Ankle eversion       (Blank rows = not tested)   LOWER EXTREMITY MMT:   MMT Right eval Left eval  Hip flexion      Hip extension      Hip abduction      Hip adduction      Hip internal rotation      Hip external rotation      Knee flexion 5 4+  Knee extension 5 4(TKE)  Ankle dorsiflexion  Ankle plantarflexion      Ankle inversion      Ankle eversion       (Blank rows = not tested)   LOWER EXTREMITY SPECIAL TESTS:  Knee special tests: Anterior drawer test: negative, Lachman Test: negative, Lateral pull sign: positive , and Patellafemoral grind test: negative   FUNCTIONAL TESTS:  5 times sit to stand: 10/12/22 17s arms crossed   GAIT: Distance walked: 83f x2 Assistive device utilized: None Level of assistance: Complete Independence Comments: unremarkable     TODAY'S TREATMENT:     [facility] Adult PT Treatment:                                                DATE: 10/12/22 Therapeutic Exercise: Nustep L4 8 min Gastroc/soleus stretch 30s x2 ea. QS's over heel block 3s hold 15x Runners step from 4 in block 10/10 no UE support SAQs 3# 15x L abduction 3# 15x FAQs w/adduction 3# 15x L SLR 3# 15x (mirror for visual feedback) TKE GTB 15x   [facility] Adult PT Treatment:                                                DATE: 10/07/22 Therapeutic Exercise: Nustep L3 8 min QS's over heel block 3s hold 15x Heel raise over 4 in block 15x Runners step from 4 in block 10/10 Step downs from 2 in block 10/10 SAQs 2.5# 15x L abduction 2.5# 15x L SLR 2.5# 15x Seated hamstring stretch 30x 2       [facility] Adult PT Treatment:                                                DATE: 09/30/22 Therapeutic Exercise: Nustep L2 6 min QS's over heel block 3s hold 15x SAQs 2# 15x L abduction 2# 15x L SLR 2# 15x Bridge with ball 15x L clams with PT stabilization 15x Supine hip fallouts GTB 15x FAQs with adduction 3s hold 15x TKEs GTB 15x                                                                                                                   DATE: 09/17/22  Eval and HEP     PATIENT EDUCATION:  Education details: Discussed eval findings, rehab rationale and POC and patient is in agreement  Person educated: Patient Education method: Explanation Education comprehension: verbalized understanding and needs further education   HOME EXERCISE PROGRAM: Access Code: [email or web address]: [email or web address] Date: 10/12/2022 Prepared by: JSharlynn Oliphant Exercises - Supine Quad Set  - 2 x daily - 5 x weekly - 2  sets - 15 reps - 3s hold - Active Straight Leg Raise with Quad Set  - 2 x daily - 5 x weekly - 2 sets - 15 reps - Standing Terminal Knee Extension with Resistance  - 2 x daily - 5 x weekly - 2 sets - 15 reps - Short Arc Quad with Ankle Weight  - 2 x daily - 5 x weekly - 2 sets - 15 reps ASSESSMENT:   CLINICAL IMPRESSION: Today's treatment focus was on TKE, VMO activation using visual feedback and tactile sensation.  Still exhibits an extensor lag with SLR, SAQs and FAQs with adduction but able to recognize and self correct.  HEP updated to emphasize TKE and VMO activation.  L VMO/VL latency still present creating a lateral pull at patellofemoral joint.  Improved TKE demoed in gait pattern.    OBJECTIVE IMPAIRMENTS: Abnormal gait, decreased activity tolerance, decreased balance, decreased knowledge of condition, decreased ROM, decreased strength, pain, and effusion .    ACTIVITY LIMITATIONS: carrying, lifting, standing, hygiene/grooming, caring for others, and pushing son's WC   PERSONAL FACTORS: Age and Past/current experiences are also affecting patient's functional outcome.    REHAB POTENTIAL: Fair based on previous unsuccessful conservative treatments    CLINICAL DECISION MAKING: Evolving/moderate complexity   EVALUATION COMPLEXITY: Moderate     GOALS: Goals reviewed with patient? No   SHORT TERM GOALS:  Target date: 10/08/2022   Patient to demonstrate independence in HEP Baseline:[id number] Goal status: Met   2.  Perform 5x STS test and establish goal as needed Baseline: TBD; 10/12/22 17s arms crossed Goal status: Met     LONG TERM GOALS: Target date: 10/29/2022     -3d AROM L knee extension Baseline: -6d AROM extension Goal status: INITIAL   2.  4+/5 L quad strength in TKE Baseline: 4/5 strength in TKE L Goal status: INITIAL   3.  4/10 worst pain Baseline: 8/10  Goal status: INITIAL   4.  Negative lateral L patellar pull sign Baseline: Positive L patellar pull sign Goal status: INITIAL   5.  Increase FOTO score to 59 Baseline: 45 Goal status: INITIAL       PLAN:   PT FREQUENCY: 2x/week   PT DURATION: 6 weeks   PLANNED INTERVENTIONS: Therapeutic exercises, Therapeutic activity, Neuromuscular re-education, Balance training, Gait training, Patient/Family education, Self Care, Joint mobilization, Stair training, Manual therapy, and Re-evaluation   PLAN FOR NEXT SESSION: HEP review and update, ROM, quad strengthening, flexibility, aerobic training   Lanice Shirts, PT 10/12/2022, 4:01 PM

## 2022-10-14 ENCOUNTER — Other Ambulatory Visit: Payer: Self-pay | Admitting: Gastroenterology

## 2022-10-19 ENCOUNTER — Other Ambulatory Visit: Payer: Self-pay | Admitting: Gastroenterology

## 2022-10-19 ENCOUNTER — Ambulatory Visit: Payer: Medicare PPO

## 2022-10-19 DIAGNOSIS — M6281 Muscle weakness (generalized): Secondary | ICD-10-CM | POA: Diagnosis not present

## 2022-10-19 DIAGNOSIS — M25562 Pain in left knee: Secondary | ICD-10-CM | POA: Diagnosis not present

## 2022-10-19 DIAGNOSIS — G8929 Other chronic pain: Secondary | ICD-10-CM

## 2022-10-19 MED ORDER — POLYETHYLENE GLYCOL 3350 17 GM/SCOOP PO POWD: ORAL | 5 refills | Status: AC

## 2022-10-19 NOTE — Therapy (Signed)
OUTPATIENT PHYSICAL THERAPY TREATMENT NOTE   Patient Name: [name] MRN: [id number] DOB:01-Jun-1956, 67 y.o., female Today's Date: 10/19/2022  PCP: Rosine Door   REFERRING PROVIDER: Dene Gentry, MD    END OF SESSION:   PT End of Session - 10/19/22 1440     Visit Number 5    Number of Visits 12    Date for PT Re-Evaluation 11/12/22    Authorization Type humana    PT Start Time 1440    PT Stop Time 1525    PT Time Calculation (min) 45 min    Activity Tolerance Patient tolerated treatment well    Behavior During Therapy WFL for tasks assessed/performed              Past Medical History:  Diagnosis Date   GERD (gastroesophageal reflux disease)    Hyperlipidemia    Lung nodule    left    Osteopenia    Osteoporosis    Squamous cell skin cancer    Right leg   Past Surgical History:  Procedure Laterality Date   BIOPSY  11/02/2019   Procedure: BIOPSY;  Surgeon: Daneil Dolin, MD;  [facility]: [facility];  Service: Endoscopy;;  gastric   Biopsy right leg Right    skin   COLONOSCOPY N/A 09/17/2017   Dr. Gala Romney: normal colonoscopy.  Next colonoscopy in 10 years.   ESOPHAGOGASTRODUODENOSCOPY (EGD) WITH PROPOFOL N/A 11/02/2019   Dr. Gala Romney: Gastric mucosa coated by thick tenacious bile-stained mucus.  Stomach diffusely injected with scattered focal hemorrhagic erosions but no frank ulcer.  Small hiatal hernia.  Gastric biopsy showed nonspecific reactive gastropathy, no H. pylori.  Duodenum appeared normal.   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 Left knee pain - had lateral meniscus debridement, ACL partial tear debridement, and chondroplasty performed 01/28/22.  She continues to struggle with pain and swelling in this knee now 6 months out from this surgery despite physical therapy, home exercises, intraarticular steroid injection.  Will proceed with MRI to assess for new medial meniscus tear, why she has not improved despite 6 months of conservative treatment.   PRECAUTIONS: none  SUBJECTIVE:  SUBJECTIVE STATEMENT:  Has had a hectic weekend attending to her sick mother and kne is aching due to increased physical and mental stressors.   PAIN:  Are you having pain? Yes: NPRS scale: 7/10 Pain location: L knee  Pain description: ache Aggravating factors: prolonged activity/positions Relieving factors: position changes and rest   OBJECTIVE: (objective measures completed at initial evaluation unless otherwise dated)   DIAGNOSTIC FINDINGS: CLINICAL DATA:  Chronic left knee pain. Patient underwent lateral meniscal debridement, debridement of partial ACL tear and chondroplasty 01/28/2022. Persistent pain and swelling.   EXAM: MRI OF THE LEFT KNEE WITHOUT CONTRAST   TECHNIQUE: Multiplanar, multisequence MR imaging of the knee was performed. No intravenous contrast was administered.   COMPARISON:  MRI 12/19/2021   FINDINGS: MENISCI   Medial meniscus:  Intact with normal morphology.   Lateral meniscus: Interval debridement of the previously demonstrated tear of the anterior  horn. No recurrent meniscal tear or displaced meniscal fragment identified.   LIGAMENTS   Cruciates: The ACL is intact. No definite residual abnormality of the PCL identified.   Collaterals:  Intact.   CARTILAGE   Patellofemoral: Mild lateral patellar chondral thinning and surface irregularity without full-thickness defect or subchondral signal abnormality.   Medial:  Preserved.   Lateral:  Preserved.   MISCELLANEOUS   Joint: Moderate-sized joint effusion, increased in volume from previous study.   Popliteal Fossa: The popliteus muscle and tendon are intact. Small Baker's cyst.   Extensor Mechanism:  Intact.   Bones:  No acute or significant extra-articular osseous findings.   Other: Mild postoperative scarring in Hoffa's fat.   IMPRESSION: 1. Interval debridement of the previously demonstrated tear of the anterior horn of the lateral meniscus. No recurrent meniscal tear or displaced meniscal fragment identified. 2. The medial meniscus, cruciate and collateral ligaments are intact. 3. Moderate-sized joint effusion and small Baker's cyst, increased in volume from previous study. 4. No acute osseous findings.     Electronically Signed   By: Richardean Sale M.D.   On: 08/18/2022 15:53   PATIENT SURVEYS:  FOTO 45(59 predicted)   COGNITION: Overall cognitive status: Within functional limits for tasks assessed                EDEMA:  L knee effusion    MUSCLE LENGTH: Hamstrings: Right 80 deg; Left 80 deg Thomas test: negative   POSTURE: No Significant postural limitations   PALPATION: Lateral L patellar pull with quad contraction    LOWER EXTREMITY ROM:   A/PROM Right eval Left eval  Hip flexion [facility]  Hip extension [facility]  Hip abduction [facility]  Hip adduction      Hip internal rotation      Hip external rotation      Knee flexion [facility] 110/116  Knee extension [facility] -6/-18d  Ankle dorsiflexion      Ankle plantarflexion      Ankle inversion       Ankle eversion       (Blank rows = not tested)   LOWER EXTREMITY MMT:   MMT Right eval Left eval  Hip flexion      Hip extension      Hip abduction      Hip adduction      Hip internal rotation      Hip external rotation      Knee flexion 5 4+  Knee extension 5 4(TKE)  Ankle dorsiflexion      Ankle plantarflexion      Ankle  inversion      Ankle eversion       (Blank rows = not tested)   LOWER EXTREMITY SPECIAL TESTS:  Knee special tests: Anterior drawer test: negative, Lachman Test: negative, Lateral pull sign: positive , and Patellafemoral grind test: negative   FUNCTIONAL TESTS:  5 times sit to stand: 10/12/22 17s arms crossed   GAIT: Distance walked: 86f x2 Assistive device utilized: None Level of assistance: Complete Independence Comments: unremarkable     TODAY'S TREATMENT:     [facility] Adult PT Treatment:                                                DATE: 10/19/22 Therapeutic Exercise: Nustep L5 8 min Gastroc/soleus stretch 30s x2 ea. QS's over heel block 3s hold 15x SAQs 4# 15x L abduction 4# 15x L SLR 4# 15x (mirror for visual feedback) TKE GTB 15x 3 angles of pull Step downs 4 in 14=5x  [facility] Adult PT Treatment:                                                DATE: 10/12/22 Therapeutic Exercise: Nustep L4 8 min Gastroc/soleus stretch 30s x2 ea. QS's over heel block 3s hold 15x Runners step from 4 in block 10/10 no UE support SAQs 3# 15x L abduction 3# 15x FAQs w/adduction 3# 15x L SLR 3# 15x (mirror for visual feedback) TKE GTB 15x   [facility] Adult PT Treatment:                                                DATE: 10/07/22 Therapeutic Exercise: Nustep L3 8 min QS's over heel block 3s hold 15x Heel raise over 4 in block 15x Runners step from 4 in block 10/10 Step downs from 2 in block 10/10 SAQs 2.5# 15x L abduction 2.5# 15x L SLR 2.5# 15x Seated hamstring stretch 30x 2       [facility] Adult PT Treatment:                                                 DATE: 09/30/22 Therapeutic Exercise: Nustep L2 6 min QS's over heel block 3s hold 15x SAQs 2# 15x L abduction 2# 15x L SLR 2# 15x Bridge with ball 15x L clams with PT stabilization 15x Supine hip fallouts GTB 15x FAQs with adduction 3s hold 15x TKEs GTB 15x  DATE: 09/17/22  Eval and HEP     PATIENT EDUCATION:  Education details: Discussed eval findings, rehab rationale and POC and patient is in agreement  Person educated: Patient Education method: Explanation Education comprehension: verbalized understanding and needs further education   HOME EXERCISE PROGRAM: Access Code: [email or web address] URL: [email or web address] Date: 10/12/2022 Prepared by: Sharlynn Oliphant  Exercises - Supine Quad Set  - 2 x daily - 5 x weekly - 2 sets - 15 reps - 3s hold - Active Straight Leg Raise with Quad Set  - 2 x daily - 5 x weekly - 2 sets - 15 reps - Standing Terminal Knee Extension with Resistance  - 2 x daily - 5 x weekly - 2 sets - 15 reps - Short Arc Quad with Ankle Weight  - 2 x daily - 5 x weekly - 2 sets - 15 reps ASSESSMENT:   CLINICAL IMPRESSION: Continued gait pattern of IR and flexion of L knee when ambulating. Tried to focus on reversing pattern/habit with exercises and activities in clinic.  Added 3 way pull to TKEs and introduced CKC step downs to emphasize eccentric strength    OBJECTIVE IMPAIRMENTS: Abnormal gait, decreased activity tolerance, decreased balance, decreased knowledge of condition, decreased ROM, decreased strength, pain, and effusion .    ACTIVITY LIMITATIONS: carrying, lifting, standing, hygiene/grooming, caring for others, and pushing son's WC   PERSONAL FACTORS: Age and Past/current experiences are also affecting patient's functional outcome.    REHAB POTENTIAL: Fair based on previous unsuccessful conservative treatments    CLINICAL DECISION  MAKING: Evolving/moderate complexity   EVALUATION COMPLEXITY: Moderate     GOALS: Goals reviewed with patient? No   SHORT TERM GOALS: Target date: 10/08/2022   Patient to demonstrate independence in HEP Baseline:[id number] Goal status: Met   2.  Perform 5x STS test and establish goal as needed Baseline: TBD; 10/12/22 17s arms crossed Goal status: Met     LONG TERM GOALS: Target date: 10/29/2022     -3d AROM L knee extension Baseline: -6d AROM extension Goal status: INITIAL   2.  4+/5 L quad strength in TKE Baseline: 4/5 strength in TKE L Goal status: INITIAL   3.  4/10 worst pain Baseline: 8/10  Goal status: INITIAL   4.  Negative lateral L patellar pull sign Baseline: Positive L patellar pull sign Goal status: INITIAL   5.  Increase FOTO score to 59 Baseline: 45 Goal status: INITIAL       PLAN:   PT FREQUENCY: 2x/week   PT DURATION: 6 weeks   PLANNED INTERVENTIONS: Therapeutic exercises, Therapeutic activity, Neuromuscular re-education, Balance training, Gait training, Patient/Family education, Self Care, Joint mobilization, Stair training, Manual therapy, and Re-evaluation   PLAN FOR NEXT SESSION: HEP review and update, ROM, quad strengthening, flexibility, aerobic training   Lanice Shirts, PT 10/19/2022, 3:28 PM

## 2022-10-26 ENCOUNTER — Ambulatory Visit: Payer: Medicare PPO

## 2022-10-26 DIAGNOSIS — G8929 Other chronic pain: Secondary | ICD-10-CM

## 2022-10-26 DIAGNOSIS — M6281 Muscle weakness (generalized): Secondary | ICD-10-CM

## 2022-10-26 DIAGNOSIS — M25562 Pain in left knee: Secondary | ICD-10-CM | POA: Diagnosis not present

## 2022-10-26 NOTE — Therapy (Signed)
OUTPATIENT PHYSICAL THERAPY TREATMENT NOTE   Patient Name: [name] MRN: [id number] DOB:02/23/1956, 67 y.o., female Today's Date: 10/26/2022  PCP: Rosine Door   REFERRING PROVIDER: Dene Gentry, MD    END OF SESSION:   PT End of Session - 10/26/22 1440     Visit Number 6    Number of Visits 12    Date for PT Re-Evaluation 11/12/22    Authorization Type humana    PT Start Time 1445    PT Stop Time 1525    PT Time Calculation (min) 40 min    Activity Tolerance Patient tolerated treatment well    Behavior During Therapy WFL for tasks assessed/performed              Past Medical History:  Diagnosis Date   GERD (gastroesophageal reflux disease)    Hyperlipidemia    Lung nodule    left    Osteopenia    Osteoporosis    Squamous cell skin cancer    Right leg   Past Surgical History:  Procedure Laterality Date   BIOPSY  11/02/2019   Procedure: BIOPSY;  Surgeon: Daneil Dolin, MD;  Location: [facility];  Service: Endoscopy;;  gastric   Biopsy right leg Right    skin   COLONOSCOPY N/A 09/17/2017   Dr. Gala Romney: normal colonoscopy.  Next colonoscopy in 10 years.   ESOPHAGOGASTRODUODENOSCOPY (EGD) WITH PROPOFOL N/A 11/02/2019   Dr. Gala Romney: Gastric mucosa coated by thick tenacious bile-stained mucus.  Stomach diffusely injected with scattered focal hemorrhagic erosions but no frank ulcer.  Small hiatal hernia.  Gastric biopsy showed nonspecific reactive gastropathy, no H. pylori.  Duodenum appeared normal.   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 Left knee pain - had lateral meniscus debridement, ACL partial tear debridement, and chondroplasty performed 01/28/22.  She continues to struggle with pain and swelling in this knee now 6 months out from this surgery despite physical therapy, home exercises, intraarticular steroid injection.  Will proceed with MRI to assess for new medial meniscus tear, why she has not improved despite 6 months of conservative treatment.   PRECAUTIONS: none  SUBJECTIVE:  SUBJECTIVE STATEMENT:  Feels knee pain is less.  Still notes posterior knee soreness following   PAIN:  Are you having pain? Yes: NPRS scale: 7/10 Pain location: L knee  Pain description: ache Aggravating factors: prolonged activity/positions Relieving factors: position changes and rest   OBJECTIVE: (objective measures completed at initial evaluation unless otherwise dated)   DIAGNOSTIC FINDINGS: CLINICAL DATA:  Chronic left knee pain. Patient underwent lateral meniscal debridement, debridement of partial ACL tear and chondroplasty 01/28/2022. Persistent pain and swelling.   EXAM: MRI OF THE LEFT KNEE WITHOUT CONTRAST   TECHNIQUE: Multiplanar, multisequence MR imaging of the knee was performed. No intravenous contrast was administered.   COMPARISON:  MRI 12/19/2021   FINDINGS: MENISCI   Medial meniscus:  Intact with normal morphology.   Lateral meniscus: Interval debridement of the previously demonstrated tear of the anterior horn. No recurrent meniscal tear or displaced  meniscal fragment identified.   LIGAMENTS   Cruciates: The ACL is intact. No definite residual abnormality of the PCL identified.   Collaterals:  Intact.   CARTILAGE   Patellofemoral: Mild lateral patellar chondral thinning and surface irregularity without full-thickness defect or subchondral signal abnormality.   Medial:  Preserved.   Lateral:  Preserved.   MISCELLANEOUS   Joint: Moderate-sized joint effusion, increased in volume from previous study.   Popliteal Fossa: The popliteus muscle and tendon are intact. Small Baker's cyst.   Extensor Mechanism:  Intact.   Bones:  No acute or significant extra-articular osseous findings.   Other: Mild postoperative scarring in Hoffa's fat.   IMPRESSION: 1. Interval debridement of the previously demonstrated tear of the anterior horn of the lateral meniscus. No recurrent meniscal tear or displaced meniscal fragment identified. 2. The medial meniscus, cruciate and collateral ligaments are intact. 3. Moderate-sized joint effusion and small Baker's cyst, increased in volume from previous study. 4. No acute osseous findings.     Electronically Signed   By: Richardean Sale M.D.   On: 08/18/2022 15:53   PATIENT SURVEYS:  FOTO 45(59 predicted); 10/26/22 47   COGNITION: Overall cognitive status: Within functional limits for tasks assessed                EDEMA:  L knee effusion    MUSCLE LENGTH: Hamstrings: Right 80 deg; Left 80 deg Thomas test: negative   POSTURE: No Significant postural limitations   PALPATION: Lateral L patellar pull with quad contraction    LOWER EXTREMITY ROM:   A/PROM Right eval Left eval L 10/26/22  Hip flexion [facility]   Hip extension [facility]   Hip abduction [facility]   Hip adduction       Hip internal rotation       Hip external rotation       Knee flexion WFL 110/116 126d  Knee extension WFL -18d/-6 -8/-4d  Ankle dorsiflexion       Ankle plantarflexion       Ankle inversion        Ankle eversion        (Blank rows = not tested)   LOWER EXTREMITY MMT:   MMT Right eval Left eval  Hip flexion      Hip extension      Hip abduction      Hip adduction      Hip internal rotation      Hip external rotation      Knee flexion 5 4+  Knee extension 5 4(TKE)  Ankle dorsiflexion      Ankle  plantarflexion      Ankle inversion      Ankle eversion       (Blank rows = not tested)   LOWER EXTREMITY SPECIAL TESTS:  Knee special tests: Anterior drawer test: negative, Lachman Test: negative, Lateral pull sign: positive , and Patellafemoral grind test: negative   FUNCTIONAL TESTS:  5 times sit to stand: 10/12/22 17s arms crossed   GAIT: Distance walked: 40f x2 Assistive device utilized: None Level of assistance: Complete Independence Comments: unremarkable     TODAY'S TREATMENT:     [facility] Adult PT Treatment:                                                DATE: 10/26/22 Therapeutic Exercise: Treadmill retrowalk 15% 0.8 mph 5 min Gastroc/soleus stretch 30s x2 ea. QS's over heel block 3s hold 15x performed Bil SAQs 5# 15x L abduction 5# 15x FAQs with adduction 5# L SLR 5# 15x   Therapeutic Activity: ROM and FOTO  [facility] Adult PT Treatment:                                                DATE: 10/19/22 Therapeutic Exercise: Nustep L5 8 min Gastroc/soleus stretch 30s x2 ea. QS's over heel block 3s hold 15x SAQs 4# 15x L abduction 4# 15x L SLR 4# 15x (mirror for visual feedback) TKE GTB 15x 3 angles of pull Step downs 4 in 15x  [facility] Adult PT Treatment:                                                DATE: 10/12/22 Therapeutic Exercise: Nustep L4 8 min Gastroc/soleus stretch 30s x2 ea. QS's over heel block 3s hold 15x Runners step from 4 in block 10/10 no UE support SAQs 3# 15x L abduction 3# 15x FAQs w/adduction 3# 15x L SLR 3# 15x (mirror for visual feedback) TKE GTB 15x   [facility] Adult PT Treatment:                                                DATE:  10/07/22 Therapeutic Exercise: Nustep L3 8 min QS's over heel block 3s hold 15x Heel raise over 4 in block 15x Runners step from 4 in block 10/10 Step downs from 2 in block 10/10 SAQs 2.5# 15x L abduction 2.5# 15x L SLR 2.5# 15x Seated hamstring stretch 30x 2       [facility] Adult PT Treatment:                                                DATE: 09/30/22 Therapeutic Exercise: Nustep L2 6 min QS's over heel block 3s hold 15x SAQs 2# 15x L abduction 2# 15x L SLR 2# 15x Bridge with ball 15x L clams with PT stabilization 15x Supine hip fallouts GTB 15x  FAQs with adduction 3s hold 15x TKEs GTB 15x                                                                                                                  DATE: 09/17/22  Eval and HEP     PATIENT EDUCATION:  Education details: Discussed eval findings, rehab rationale and POC and patient is in agreement  Person educated: Patient Education method: Explanation Education comprehension: verbalized understanding and needs further education   HOME EXERCISE PROGRAM: Access Code: [email or web address] URL: [email or web address] Date: 10/12/2022 Prepared by: Sharlynn Oliphant  Exercises - Supine Quad Set  - 2 x daily - 5 x weekly - 2 sets - 15 reps - 3s hold - Active Straight Leg Raise with Quad Set  - 2 x daily - 5 x weekly - 2 sets - 15 reps - Standing Terminal Knee Extension with Resistance  - 2 x daily - 5 x weekly - 2 sets - 15 reps - Short Arc Quad with Ankle Weight  - 2 x daily - 5 x weekly - 2 sets - 15 reps ASSESSMENT:   CLINICAL IMPRESSION: Gains made in A/PROM and FOTO, advanced weight on LLE strength tasks, introduced retrowalking on graded treadmill.  Still demos an extensor lag as well as decreased PROM into extension but patient is closing the gap.    OBJECTIVE IMPAIRMENTS: Abnormal gait, decreased activity tolerance, decreased balance, decreased knowledge of condition, decreased ROM, decreased strength, pain, and effusion  .    ACTIVITY LIMITATIONS: carrying, lifting, standing, hygiene/grooming, caring for others, and pushing son's WC   PERSONAL FACTORS: Age and Past/current experiences are also affecting patient's functional outcome.    REHAB POTENTIAL: Fair based on previous unsuccessful conservative treatments    CLINICAL DECISION MAKING: Evolving/moderate complexity   EVALUATION COMPLEXITY: Moderate     GOALS: Goals reviewed with patient? No   SHORT TERM GOALS: Target date: 10/08/2022   Patient to demonstrate independence in HEP Baseline:[id number] Goal status: Met   2.  Perform 5x STS test and establish goal as needed Baseline: TBD; 10/12/22 17s arms crossed Goal status: Met     LONG TERM GOALS: Target date: 10/29/2022     -3d AROM L knee extension Baseline: -6d AROM extension Goal status: INITIAL   2.  4+/5 L quad strength in TKE Baseline: 4/5 strength in TKE L Goal status: INITIAL   3.  4/10 worst pain Baseline: 8/10  Goal status: INITIAL   4.  Negative lateral L patellar pull sign Baseline: Positive L patellar pull sign Goal status: INITIAL   5.  Increase FOTO score to 59 Baseline: 45 Goal status: INITIAL       PLAN:   PT FREQUENCY: 2x/week   PT DURATION: 6 weeks   PLANNED INTERVENTIONS: Therapeutic exercises, Therapeutic activity, Neuromuscular re-education, Balance training, Gait training, Patient/Family education, Self Care, Joint mobilization, Stair training, Manual therapy, and Re-evaluation   PLAN FOR NEXT SESSION: HEP review and update, ROM, quad strengthening, flexibility, aerobic training   Lanice Shirts,  PT 10/26/2022, 3:50 PM

## 2022-10-28 ENCOUNTER — Other Ambulatory Visit: Payer: Medicare PPO | Admitting: Urology

## 2022-10-28 DIAGNOSIS — R3121 Asymptomatic microscopic hematuria: Secondary | ICD-10-CM | POA: Diagnosis not present

## 2022-10-28 DIAGNOSIS — R350 Frequency of micturition: Secondary | ICD-10-CM | POA: Diagnosis not present

## 2022-10-28 DIAGNOSIS — N952 Postmenopausal atrophic vaginitis: Secondary | ICD-10-CM | POA: Diagnosis not present

## 2022-10-28 DIAGNOSIS — R351 Nocturia: Secondary | ICD-10-CM | POA: Diagnosis not present

## 2022-11-02 ENCOUNTER — Ambulatory Visit: Payer: Medicare PPO | Attending: Family Medicine

## 2022-11-02 DIAGNOSIS — G8929 Other chronic pain: Secondary | ICD-10-CM | POA: Insufficient documentation

## 2022-11-02 DIAGNOSIS — M6281 Muscle weakness (generalized): Secondary | ICD-10-CM | POA: Insufficient documentation

## 2022-11-02 DIAGNOSIS — M25562 Pain in left knee: Secondary | ICD-10-CM | POA: Insufficient documentation

## 2022-11-02 NOTE — Therapy (Signed)
OUTPATIENT PHYSICAL THERAPY TREATMENT NOTE   Patient Name: [name] MRN: [id number] DOB:Sep 24, 1956, 67 y.o., female Today's Date: 11/02/2022  PCP: Rosine Door   REFERRING PROVIDER: Dene Gentry, MD    END OF SESSION:   PT End of Session - 11/02/22 1450     Visit Number 7    Number of Visits 12    Date for PT Re-Evaluation 11/12/22    Authorization Type humana    PT Start Time 1445    PT Stop Time 1525    PT Time Calculation (min) 40 min    Activity Tolerance Patient tolerated treatment well    Behavior During Therapy WFL for tasks assessed/performed              Past Medical History:  Diagnosis Date   GERD (gastroesophageal reflux disease)    Hyperlipidemia    Lung nodule    left    Osteopenia    Osteoporosis    Squamous cell skin cancer    Right leg   Past Surgical History:  Procedure Laterality Date   BIOPSY  11/02/2019   Procedure: BIOPSY;  Surgeon: Daneil Dolin, MD;  Location: [facility];  Service: Endoscopy;;  gastric   Biopsy right leg Right    skin   COLONOSCOPY N/A 09/17/2017   Dr. Gala Romney: normal colonoscopy.  Next colonoscopy in 10 years.   ESOPHAGOGASTRODUODENOSCOPY (EGD) WITH PROPOFOL N/A 11/02/2019   Dr. Gala Romney: Gastric mucosa coated by thick tenacious bile-stained mucus.  Stomach diffusely injected with scattered focal hemorrhagic erosions but no frank ulcer.  Small hiatal hernia.  Gastric biopsy showed nonspecific reactive gastropathy, no H. pylori.  Duodenum appeared normal.   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 Left knee pain - had lateral meniscus debridement, ACL partial tear debridement, and chondroplasty performed 01/28/22.  She continues to struggle with pain and swelling in this knee now 6 months out from this surgery despite physical therapy, home exercises, intraarticular steroid injection.  Will proceed with MRI to assess for new medial meniscus tear, why she has not improved despite 6 months of conservative treatment.   PRECAUTIONS: none  SUBJECTIVE:  SUBJECTIVE STATEMENT:  Increased knee symptoms following a hectic and stressful week.  Wore knee brace with uprights and felt it may have aggravated her knee   PAIN:  Are you having pain? Yes: NPRS scale: 7/10 Pain location: L knee  Pain description: ache Aggravating factors: prolonged activity/positions Relieving factors: position changes and rest   OBJECTIVE: (objective measures completed at initial evaluation unless otherwise dated)   DIAGNOSTIC FINDINGS: CLINICAL DATA:  Chronic left knee pain. Patient underwent lateral meniscal debridement, debridement of partial ACL tear and chondroplasty 01/28/2022. Persistent pain and swelling.   EXAM: MRI OF THE LEFT KNEE WITHOUT CONTRAST   TECHNIQUE: Multiplanar, multisequence MR imaging of the knee was performed. No intravenous contrast was administered.   COMPARISON:  MRI 12/19/2021   FINDINGS: MENISCI   Medial meniscus:  Intact with normal morphology.   Lateral meniscus: Interval debridement of the previously demonstrated tear of  the anterior horn. No recurrent meniscal tear or displaced meniscal fragment identified.   LIGAMENTS   Cruciates: The ACL is intact. No definite residual abnormality of the PCL identified.   Collaterals:  Intact.   CARTILAGE   Patellofemoral: Mild lateral patellar chondral thinning and surface irregularity without full-thickness defect or subchondral signal abnormality.   Medial:  Preserved.   Lateral:  Preserved.   MISCELLANEOUS   Joint: Moderate-sized joint effusion, increased in volume from previous study.   Popliteal Fossa: The popliteus muscle and tendon are intact. Small Baker's cyst.   Extensor Mechanism:  Intact.   Bones:  No acute or significant extra-articular osseous findings.   Other: Mild postoperative scarring in Hoffa's fat.   IMPRESSION: 1. Interval debridement of the previously demonstrated tear of the anterior horn of the lateral meniscus. No recurrent meniscal tear or displaced meniscal fragment identified. 2. The medial meniscus, cruciate and collateral ligaments are intact. 3. Moderate-sized joint effusion and small Baker's cyst, increased in volume from previous study. 4. No acute osseous findings.     Electronically Signed   By: Richardean Sale M.D.   On: 08/18/2022 15:53   PATIENT SURVEYS:  FOTO 45(59 predicted); 10/26/22 47   COGNITION: Overall cognitive status: Within functional limits for tasks assessed                EDEMA:  L knee effusion    MUSCLE LENGTH: Hamstrings: Right 80 deg; Left 80 deg Thomas test: negative   POSTURE: No Significant postural limitations   PALPATION: Lateral L patellar pull with quad contraction    LOWER EXTREMITY ROM:   A/PROM Right eval Left eval L 10/26/22  Hip flexion [facility]   Hip extension [facility]   Hip abduction [facility]   Hip adduction       Hip internal rotation       Hip external rotation       Knee flexion WFL 110/116 126d  Knee extension WFL -18d/-6 -8/-4d  Ankle  dorsiflexion       Ankle plantarflexion       Ankle inversion       Ankle eversion        (Blank rows = not tested)   LOWER EXTREMITY MMT:   MMT Right eval Left eval  Hip flexion      Hip extension      Hip abduction      Hip adduction      Hip internal rotation      Hip external rotation      Knee flexion 5 4+  Knee extension  5 4(TKE)  Ankle dorsiflexion      Ankle plantarflexion      Ankle inversion      Ankle eversion       (Blank rows = not tested)   LOWER EXTREMITY SPECIAL TESTS:  Knee special tests: Anterior drawer test: negative, Lachman Test: negative, Lateral pull sign: positive , and Patellafemoral grind test: negative   FUNCTIONAL TESTS:  5 times sit to stand: 10/12/22 17s arms crossed   GAIT: Distance walked: 2f x2 Assistive device utilized: None Level of assistance: Complete Independence Comments: unremarkable     TODAY'S TREATMENT:     [facility] Adult PT Treatment:                                                DATE: 11/02/22 Therapeutic Exercise: QS's 3s hold 15x SLR 5# 15x (to height of other Knee) SAQs 5# 15x SL bridge L 15x L hip fallout GTB (R against bolster) 15x Runner step 4 in L 15x FAQs w/adduction 5#    [facility] Adult PT Treatment:                                                DATE: 10/26/22 Therapeutic Exercise: Treadmill retrowalk 15% 0.8 mph 5 min Gastroc/soleus stretch 30s x2 ea. QS's over heel block 3s hold 15x performed Bil SAQs 5# 15x L abduction 5# 15x FAQs with adduction 5# L SLR 5# 15x   Therapeutic Activity: ROM and FOTO  [facility] Adult PT Treatment:                                                DATE: 10/19/22 Therapeutic Exercise: Nustep L5 8 min Gastroc/soleus stretch 30s x2 ea. QS's over heel block 3s hold 15x SAQs 4# 15x L abduction 4# 15x L SLR 4# 15x (mirror for visual feedback) TKE GTB 15x 3 angles of pull Step downs 4 in 15x  [facility] Adult PT Treatment:                                                DATE:  10/12/22 Therapeutic Exercise: Nustep L4 8 min Gastroc/soleus stretch 30s x2 ea. QS's over heel block 3s hold 15x Runners step from 4 in block 10/10 no UE support SAQs 3# 15x L abduction 3# 15x FAQs w/adduction 3# 15x L SLR 3# 15x (mirror for visual feedback) TKE GTB 15x   [facility] Adult PT Treatment:                                                DATE: 10/07/22 Therapeutic Exercise: Nustep L3 8 min QS's over heel block 3s hold 15x Heel raise over 4 in block 15x Runners step from 4 in block 10/10 Step downs from 2 in block 10/10 SAQs 2.5# 15x L abduction 2.5# 15x L SLR  2.5# 15x Seated hamstring stretch 30x 2       [facility] Adult PT Treatment:                                                DATE: 09/30/22 Therapeutic Exercise: Nustep L2 6 min QS's over heel block 3s hold 15x SAQs 2# 15x L abduction 2# 15x L SLR 2# 15x Bridge with ball 15x L clams with PT stabilization 15x Supine hip fallouts GTB 15x FAQs with adduction 3s hold 15x TKEs GTB 15x                                                                                                                  DATE: 09/17/22  Eval and HEP     PATIENT EDUCATION:  Education details: Discussed eval findings, rehab rationale and POC and patient is in agreement  Person educated: Patient Education method: Explanation Education comprehension: verbalized understanding and needs further education   HOME EXERCISE PROGRAM: Access Code: [email or web address] URL: [email or web address] Date: 10/12/2022 Prepared by: Sharlynn Oliphant  Exercises - Supine Quad Set  - 2 x daily - 5 x weekly - 2 sets - 15 reps - 3s hold - Active Straight Leg Raise with Quad Set  - 2 x daily - 5 x weekly - 2 sets - 15 reps - Standing Terminal Knee Extension with Resistance  - 2 x daily - 5 x weekly - 2 sets - 15 reps - Short Arc Quad with Ankle Weight  - 2 x daily - 5 x weekly - 2 sets - 15 reps ASSESSMENT:   CLINICAL IMPRESSION: Added additional abduction  strengthening tasks against resistance, incorporated single leg bridges, L hip fallouts, clams and runner step to address abduction and ER weakness.  Continued focus on TKE with patient showing improved ability to maintain TKE with SLR and SAQs.  Palpation finds a more defined VMO with improved VMO/VL ratio.    OBJECTIVE IMPAIRMENTS: Abnormal gait, decreased activity tolerance, decreased balance, decreased knowledge of condition, decreased ROM, decreased strength, pain, and effusion .    ACTIVITY LIMITATIONS: carrying, lifting, standing, hygiene/grooming, caring for others, and pushing son's WC   PERSONAL FACTORS: Age and Past/current experiences are also affecting patient's functional outcome.    REHAB POTENTIAL: Fair based on previous unsuccessful conservative treatments    CLINICAL DECISION MAKING: Evolving/moderate complexity   EVALUATION COMPLEXITY: Moderate     GOALS: Goals reviewed with patient? No   SHORT TERM GOALS: Target date: 10/08/2022   Patient to demonstrate independence in HEP Baseline:[id number] Goal status: Met   2.  Perform 5x STS test and establish goal as needed Baseline: TBD; 10/12/22 17s arms crossed Goal status: Met     LONG TERM GOALS: Target date: 10/29/2022     -3d AROM L knee extension Baseline: -6d AROM extension Goal status: INITIAL   2.  4+/5 L quad strength in  TKE Baseline: 4/5 strength in TKE L Goal status: INITIAL   3.  4/10 worst pain Baseline: 8/10  Goal status: INITIAL   4.  Negative lateral L patellar pull sign Baseline: Positive L patellar pull sign Goal status: INITIAL   5.  Increase FOTO score to 59 Baseline: 45 Goal status: INITIAL       PLAN:   PT FREQUENCY: 2x/week   PT DURATION: 6 weeks   PLANNED INTERVENTIONS: Therapeutic exercises, Therapeutic activity, Neuromuscular re-education, Balance training, Gait training, Patient/Family education, Self Care, Joint mobilization, Stair training, Manual therapy, and  Re-evaluation   PLAN FOR NEXT SESSION: HEP review and update, ROM, quad strengthening, flexibility, aerobic training   Lanice Shirts, PT 11/02/2022, 3:34 PM

## 2022-11-09 ENCOUNTER — Ambulatory Visit: Payer: Medicare PPO

## 2022-11-09 DIAGNOSIS — G8929 Other chronic pain: Secondary | ICD-10-CM | POA: Diagnosis not present

## 2022-11-09 DIAGNOSIS — M6281 Muscle weakness (generalized): Secondary | ICD-10-CM | POA: Diagnosis not present

## 2022-11-09 DIAGNOSIS — M25562 Pain in left knee: Secondary | ICD-10-CM | POA: Diagnosis not present

## 2022-11-09 NOTE — Therapy (Signed)
OUTPATIENT PHYSICAL THERAPY TREATMENT NOTE   Patient Name: [name] MRN: [id number] DOB:Apr 25, 1956, 67 y.o., female Today's Date: 11/09/2022  PCP: Rosalee Kaufman, PA-C   REFERRING PROVIDER: Dene Gentry, MD    END OF SESSION:   PT End of Session - 11/09/22 1442     Visit Number 8    Number of Visits 12    Date for PT Re-Evaluation 11/12/22    Authorization Type humana    PT Start Time 1445    PT Stop Time 1525    PT Time Calculation (min) 40 min    Activity Tolerance Patient tolerated treatment well    Behavior During Therapy WFL for tasks assessed/performed              Past Medical History:  Diagnosis Date   GERD (gastroesophageal reflux disease)    Hyperlipidemia    Lung nodule    left    Osteopenia    Osteoporosis    Squamous cell skin cancer    Right leg   Past Surgical History:  Procedure Laterality Date   BIOPSY  11/02/2019   Procedure: BIOPSY;  Surgeon: Daneil Dolin, MD;  [facility]: [facility];  Service: Endoscopy;;  gastric   Biopsy right leg Right    skin   COLONOSCOPY N/A 09/17/2017   Dr. Gala Romney: normal colonoscopy.  Next colonoscopy in 10 years.   ESOPHAGOGASTRODUODENOSCOPY (EGD) WITH PROPOFOL N/A 11/02/2019   Dr. Gala Romney: Gastric mucosa coated by thick tenacious bile-stained mucus.  Stomach diffusely injected with scattered focal hemorrhagic erosions but no frank ulcer.  Small hiatal hernia.  Gastric biopsy showed nonspecific reactive gastropathy, no H. pylori.  Duodenum appeared normal.   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 Left knee pain - had lateral meniscus debridement, ACL partial tear debridement, and chondroplasty performed 01/28/22.  She continues to struggle with pain and swelling in this knee now 6 months out from this surgery despite physical therapy, home exercises, intraarticular steroid injection.  Will proceed with MRI to assess for new medial meniscus tear, why she has not improved despite 6 months of conservative treatment.   PRECAUTIONS: none  SUBJECTIVE:  SUBJECTIVE STATEMENT:  L knee symptoms fluctuate, average pain 4/10 but 0/10 at best.   PAIN:  Are you having pain? Yes: NPRS scale: 7/10 Pain location: L knee  Pain description: ache Aggravating factors: prolonged activity/positions Relieving factors: position changes and rest   OBJECTIVE: (objective measures completed at initial evaluation unless otherwise dated)   DIAGNOSTIC FINDINGS: CLINICAL DATA:  Chronic left knee pain. Patient underwent lateral meniscal debridement, debridement of partial ACL tear and chondroplasty 01/28/2022. Persistent pain and swelling.   EXAM: MRI OF THE LEFT KNEE WITHOUT CONTRAST   TECHNIQUE: Multiplanar, multisequence MR imaging of the knee was performed. No intravenous contrast was administered.   COMPARISON:  MRI 12/19/2021   FINDINGS: MENISCI   Medial meniscus:  Intact with normal morphology.   Lateral meniscus: Interval debridement of the previously demonstrated tear of the anterior horn. No recurrent meniscal tear or displaced meniscal  fragment identified.   LIGAMENTS   Cruciates: The ACL is intact. No definite residual abnormality of the PCL identified.   Collaterals:  Intact.   CARTILAGE   Patellofemoral: Mild lateral patellar chondral thinning and surface irregularity without full-thickness defect or subchondral signal abnormality.   Medial:  Preserved.   Lateral:  Preserved.   MISCELLANEOUS   Joint: Moderate-sized joint effusion, increased in volume from previous study.   Popliteal Fossa: The popliteus muscle and tendon are intact. Small Baker's cyst.   Extensor Mechanism:  Intact.   Bones:  No acute or significant extra-articular osseous findings.   Other: Mild postoperative scarring in Hoffa's fat.   IMPRESSION: 1. Interval debridement of the previously demonstrated tear of the anterior horn of the lateral meniscus. No recurrent meniscal tear or displaced meniscal fragment identified. 2. The medial meniscus, cruciate and collateral ligaments are intact. 3. Moderate-sized joint effusion and small Baker's cyst, increased in volume from previous study. 4. No acute osseous findings.     Electronically Signed   By: Richardean Sale M.D.   On: 08/18/2022 15:53   PATIENT SURVEYS:  FOTO 45(59 predicted); 10/26/22 47   COGNITION: Overall cognitive status: Within functional limits for tasks assessed                EDEMA:  L knee effusion    MUSCLE LENGTH: Hamstrings: Right 80 deg; Left 80 deg Thomas test: negative   POSTURE: No Significant postural limitations   PALPATION: Lateral L patellar pull with quad contraction    LOWER EXTREMITY ROM:   A/PROM Right eval Left eval L 10/26/22 L 11/09/22  Hip flexion [facility]    Hip extension [facility]    Hip abduction [facility]    Hip adduction        Hip internal rotation        Hip external rotation        Knee flexion WFL 110/116 126d   Knee extension WFL -18d/-6 -8/-4d -4/0d  Ankle dorsiflexion        Ankle plantarflexion        Ankle  inversion        Ankle eversion         (Blank rows = not tested)   LOWER EXTREMITY MMT:   MMT Right eval Left eval  Hip flexion      Hip extension      Hip abduction      Hip adduction      Hip internal rotation      Hip external rotation      Knee flexion 5 4+  Knee extension 5 4(TKE)  Ankle dorsiflexion      Ankle plantarflexion      Ankle inversion      Ankle eversion       (Blank rows = not tested)   LOWER EXTREMITY SPECIAL TESTS:  Knee special tests: Anterior drawer test: negative, Lachman Test: negative, Lateral pull sign: positive , and Patellafemoral grind test: negative   FUNCTIONAL TESTS:  5 times sit to stand: 10/12/22 17s arms crossed; 11/09/22 14s arms crossed   GAIT: Distance walked: 96f x2 Assistive device utilized: None Level of assistance: Complete Independence Comments: unremarkable     TODAY'S TREATMENT:     [facility] Adult PT Treatment:                                                DATE: 11/09/22 Therapeutic Exercise: CCharmian Muffwalk 13# bwd/B side step 5 reps each Nustep L6 8 min(60-80 SPM) FAQs with adduction 5# ball   Therapeutic Activity: ROM and stretch assessment 5x STS 14s arms crossed  [facility]                                                [facility]: 11/02/22 Therapeutic Exercise: QS's 3s hold 15x SLR 5# 15x (to height of other Knee) SAQs 5# 15x SL bridge L 15x L hip fallout GTB (R against bolster) 15x Runner step 4 in L 15x FAQs w/adduction 5#    [facility] Adult PT Treatment:                                                DATE: 10/26/22 Therapeutic Exercise: Treadmill retrowalk 15% 0.8 mph 5 min Gastroc/soleus stretch 30s x2 ea. QS's over heel block 3s hold 15x performed Bil SAQs 5# 15x L abduction 5# 15x FAQs with adduction 5# L SLR 5# 15x   Therapeutic Activity: ROM and FOTO                                                                                                                   DATE: 09/17/22  Eval and HEP      PATIENT EDUCATION:  Education details: Discussed eval findings, rehab rationale and POC and patient is in agreement  Person educated: Patient Education method: Explanation Education comprehension: verbalized understanding and needs further education   HOME EXERCISE PROGRAM: Access Code: [email or web address]: [email or web address] Date: 10/12/2022 Prepared by: JSharlynn Oliphant Exercises - Supine Quad Set  - 2 x daily - 5 x weekly - 2 sets - 15 reps - 3s hold - Active Straight Leg Raise with Quad Set  - 2  x daily - 5 x weekly - 2 sets - 15 reps - Standing Terminal Knee Extension with Resistance  - 2 x daily - 5 x weekly - 2 sets - 15 reps - Short Arc Quad with Ankle Weight  - 2 x daily - 5 x weekly - 2 sets - 15 reps  ASSESSMENT:   CLINICAL IMPRESSION: Patient has gained ROM in L knee, extension in prone essentially 0d PROM with a -4d extensor lag during a SLR.  All STGs met.  Patient showing good patellar tracking, an indication of improved VMO/VL firing ratio    OBJECTIVE IMPAIRMENTS: Abnormal gait, decreased activity tolerance, decreased balance, decreased knowledge of condition, decreased ROM, decreased strength, pain, and effusion .    ACTIVITY LIMITATIONS: carrying, lifting, standing, hygiene/grooming, caring for others, and pushing son's WC   PERSONAL FACTORS: Age and Past/current experiences are also affecting patient's functional outcome.    REHAB POTENTIAL: Fair based on previous unsuccessful conservative treatments    CLINICAL DECISION MAKING: Evolving/moderate complexity   EVALUATION COMPLEXITY: Moderate     GOALS: Goals reviewed with patient? No   SHORT TERM GOALS: Target date: 10/08/2022   Patient to demonstrate independence in HEP Baseline:[id number] Goal status: Met   2.  Perform 5x STS test and establish goal as needed Baseline: TBD; 10/12/22 17s arms crossed; 11/09/22 14s Goal status: Met     LONG TERM GOALS: Target date: 10/29/2022     -3d AROM L  knee extension Baseline: -6d AROM extension; 11/09/22 -4d Goal status: Progressing   2.  4+/5 L quad strength in TKE Baseline: 4/5 strength in TKE L Goal status: INITIAL   3.  4/10 worst pain Baseline: 8/10; 11/09/22 4/10 worst pain, 0/10 best pain Goal status: Met   4.  Negative lateral L patellar pull sign Baseline: Positive L patellar pull sign; 11/09/22 minimal pull with FAQs with adduction Goal status: Met   5.  Increase FOTO score to 59 Baseline: 45; 10/26/22 47 Goal status: Progressing       PLAN:   PT FREQUENCY: 2x/week   PT DURATION: 6 weeks   PLANNED INTERVENTIONS: Therapeutic exercises, Therapeutic activity, Neuromuscular re-education, Balance training, Gait training, Patient/Family education, Self Care, Joint mobilization, Stair training, Manual therapy, and Re-evaluation   PLAN FOR NEXT SESSION: HEP review and update, ROM, quad strengthening, flexibility, aerobic training   Lanice Shirts, PT 11/09/2022, 3:28 PM

## 2022-11-17 ENCOUNTER — Ambulatory Visit: Payer: Medicare PPO

## 2022-11-17 DIAGNOSIS — M6281 Muscle weakness (generalized): Secondary | ICD-10-CM

## 2022-11-17 DIAGNOSIS — G8929 Other chronic pain: Secondary | ICD-10-CM

## 2022-11-17 DIAGNOSIS — M25562 Pain in left knee: Secondary | ICD-10-CM | POA: Diagnosis not present

## 2022-11-17 NOTE — Therapy (Signed)
OUTPATIENT PHYSICAL THERAPY TREATMENT NOTE   Patient Name: [name] MRN: [id number] DOB:13-Sep-1956, 67 y.o., female Today's Date: 11/17/2022  PCP: Rosine Door   REFERRING PROVIDER: Dene Gentry, MD    END OF SESSION:   PT End of Session - 11/17/22 1443     Visit Number 9    Number of Visits 12    Date for PT Re-Evaluation 11/12/22    Authorization Type humana    PT Start Time 1445    PT Stop Time 1525    PT Time Calculation (min) 40 min    Activity Tolerance Patient tolerated treatment well    Behavior During Therapy [facility] for tasks assessed/performed              Past Medical History:  Diagnosis Date   GERD (gastroesophageal reflux disease)    Hyperlipidemia    Lung nodule    left    Osteopenia    Osteoporosis    Squamous cell skin cancer    Right leg   Past Surgical History:  Procedure Laterality Date   BIOPSY  11/02/2019   Procedure: BIOPSY;  Surgeon: Daneil Dolin, MD;  Location: [facility];  Service: Endoscopy;;  gastric   Biopsy right leg Right    skin   COLONOSCOPY N/A 09/17/2017   Dr. Gala Romney: normal colonoscopy.  Next colonoscopy in 10 years.   ESOPHAGOGASTRODUODENOSCOPY (EGD) WITH PROPOFOL N/A 11/02/2019   Dr. Gala Romney: Gastric mucosa coated by thick tenacious bile-stained mucus.  Stomach diffusely injected with scattered focal hemorrhagic erosions but no frank ulcer.  Small hiatal hernia.  Gastric biopsy showed nonspecific reactive gastropathy, no H. pylori.  Duodenum appeared normal.   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 Left knee pain - had lateral meniscus debridement, ACL partial tear debridement, and chondroplasty performed 01/28/22.  She continues to struggle with pain and swelling in this knee now 6 months out from this surgery despite physical therapy, home exercises, intraarticular steroid injection.  Will proceed with MRI to assess for new medial meniscus tear, why she has not improved despite 6 months of conservative treatment.   PRECAUTIONS: none  SUBJECTIVE:  SUBJECTIVE STATEMENT:  Rates pain at 4/10 pain when present.  Symptoms more diffuse in nature.   PAIN:  Are you having pain? Yes: NPRS scale: 7/10 Pain location: L knee  Pain description: ache Aggravating factors: prolonged activity/positions Relieving factors: position changes and rest   OBJECTIVE: (objective measures completed at initial evaluation unless otherwise dated)   DIAGNOSTIC FINDINGS: CLINICAL DATA:  Chronic left knee pain. Patient underwent lateral meniscal debridement, debridement of partial ACL tear and chondroplasty 01/28/2022. Persistent pain and swelling.   EXAM: MRI OF THE LEFT KNEE WITHOUT CONTRAST   TECHNIQUE: Multiplanar, multisequence MR imaging of the knee was performed. No intravenous contrast was administered.   COMPARISON:  MRI 12/19/2021   FINDINGS: MENISCI   Medial meniscus:  Intact with normal morphology.   Lateral meniscus: Interval debridement of the previously demonstrated tear of the anterior horn. No recurrent meniscal tear or displaced  meniscal fragment identified.   LIGAMENTS   Cruciates: The ACL is intact. No definite residual abnormality of the PCL identified.   Collaterals:  Intact.   CARTILAGE   Patellofemoral: Mild lateral patellar chondral thinning and surface irregularity without full-thickness defect or subchondral signal abnormality.   Medial:  Preserved.   Lateral:  Preserved.   MISCELLANEOUS   Joint: Moderate-sized joint effusion, increased in volume from previous study.   Popliteal Fossa: The popliteus muscle and tendon are intact. Small Baker's cyst.   Extensor Mechanism:  Intact.   Bones:  No acute or significant extra-articular osseous findings.   Other: Mild postoperative scarring in Hoffa's fat.   IMPRESSION: 1. Interval debridement of the previously demonstrated tear of the anterior horn of the lateral meniscus. No recurrent meniscal tear or displaced meniscal fragment identified. 2. The medial meniscus, cruciate and collateral ligaments are intact. 3. Moderate-sized joint effusion and small Baker's cyst, increased in volume from previous study. 4. No acute osseous findings.     Electronically Signed   By: Richardean Sale M.D.   On: 08/18/2022 15:53   PATIENT SURVEYS:  FOTO 45(59 predicted); 10/26/22 47; 11/17/22 59   COGNITION: Overall cognitive status: Within functional limits for tasks assessed                EDEMA:  L knee effusion    MUSCLE LENGTH: Hamstrings: Right 80 deg; Left 80 deg Thomas test: negative   POSTURE: No Significant postural limitations   PALPATION: Lateral L patellar pull with quad contraction    LOWER EXTREMITY ROM:   A/PROM Right eval Left eval L 10/26/22 L 11/09/22  Hip flexion [facility]    Hip extension [facility]    Hip abduction [facility]    Hip adduction        Hip internal rotation        Hip external rotation        Knee flexion WFL 110/116 126d   Knee extension WFL -18d/-6 -8/-4d -4/0d  Ankle dorsiflexion        Ankle  plantarflexion        Ankle inversion        Ankle eversion         (Blank rows = not tested)   LOWER EXTREMITY MMT:   MMT Right eval Left eval  Hip flexion      Hip extension      Hip abduction      Hip adduction      Hip internal rotation      Hip external rotation      Knee  flexion 5 4+  Knee extension 5 4(TKE)  Ankle dorsiflexion      Ankle plantarflexion      Ankle inversion      Ankle eversion       (Blank rows = not tested)   LOWER EXTREMITY SPECIAL TESTS:  Knee special tests: Anterior drawer test: negative, Lachman Test: negative, Lateral pull sign: positive , and Patellafemoral grind test: negative   FUNCTIONAL TESTS:  5 times sit to stand: 10/12/22 17s arms crossed; 11/09/22 14s arms crossed   GAIT: Distance walked: 77f x2 Assistive device utilized: None Level of assistance: Complete Independence Comments: unremarkable     TODAY'S TREATMENT:     [facility] Adult PT Treatment:                                                DATE: 11/17/22 Therapeutic Exercise: Nustep L6 8 min(60-80 SPM) Bridge with ball 15x Single leg bridge 15/15 Supine hip fallouts BluTB single leg 15/15 Manual Therapy: L knee ptaellar mobs  L posterior glide NAGS 3x10 gaining 130d flexion  [facility] Adult PT Treatment:                                                DATE: 11/09/22 Therapeutic Exercise: Cable walk 13# bwd/B side step 5 reps each Nustep L6 8 min(60-80 SPM) FAQs with adduction 5# ball   Therapeutic Activity: ROM and stretch assessment 5x STS 14s arms crossed  [facility]                                                [facility]: 11/[facility]/22 Therapeutic Exercise: QS's 3s hold 15x SLR 5# 15x (to height of other Knee) SAQs 5# 15x SL bridge L 15x L hip fallout GTB (R against bolster) 15x Runner step 4 in L 15x FAQs w/adduction 5#    [facility] Adult PT Treatment:                                                DATE: 10/26/22 Therapeutic Exercise: Treadmill retrowalk 15% 0.8 mph 5  min Gastroc/soleus stretch 30s x2 ea. QS's over heel block 3s hold 15x performed Bil SAQs 5# 15x L abduction 5# 15x FAQs with adduction 5# L SLR 5# 15x   Therapeutic Activity: ROM and FOTO  DATE: 09/17/22  Eval and HEP     PATIENT EDUCATION:  Education details: Discussed eval findings, rehab rationale and POC and patient is in agreement  Person educated: Patient Education method: Explanation Education comprehension: verbalized understanding and needs further education   HOME EXERCISE PROGRAM: Access Code: [email or web address] URL: [email or web address] Date: 11/17/2022 Prepared by: Sharlynn Oliphant  Exercises - Active Straight Leg Raise with Quad Set  - 2 x daily - 5 x weekly - 2 sets - 15 reps - Standing Terminal Knee Extension with Resistance  - 2 x daily - 5 x weekly - 2 sets - 15 reps - Supine Bridge with Mini Swiss Ball Between Knees  - 2 x daily - 5 x weekly - 2 sets - 15 reps - Figure 4 Bridge  - 2 x daily - 5 x weekly - 2 sets - 15 reps - Hooklying Single Leg Bent Knee Fallouts with Resistance  - 2 x daily - 5 x weekly - 2 sets - 15 reps  ASSESSMENT:   CLINICAL IMPRESSION: Patient reports continued gains in knee strength and function as well as pain relief.  She still notes occasional levels of discomfort at 4 out of 10 but they are now diffuse in nature and short-lived.  She is unable to reproduce these symptoms in clinic and reports they occur with stressful functional activities such as pushing and pulling loads up and down hills as well as anything that involves rotational motions of the left hip.  Patient continues to show a lack of left knee flexion measured at 120 degrees actively which was increased to 130 degrees following manual techniques.  Left patellar mobility also somewhat restricted and most likely playing a part in decreased knee flexion.   Observation within clinic shows some tendency to externally rotate her left hip potentially causing added strain and further symptoms.  Patient has met her Foto goal.  Patient has not yet reached maximum benefit from physical therapy recommending an additional 4 sessions with the goal of improving flexion and stability in her left hip    OBJECTIVE IMPAIRMENTS: Abnormal gait, decreased activity tolerance, decreased balance, decreased knowledge of condition, decreased ROM, decreased strength, pain, and effusion .    ACTIVITY LIMITATIONS: carrying, lifting, standing, hygiene/grooming, caring for others, and pushing son's WC   PERSONAL FACTORS: Age and Past/current experiences are also affecting patient's functional outcome.    REHAB POTENTIAL: Fair based on previous unsuccessful conservative treatments    CLINICAL DECISION MAKING: Evolving/moderate complexity   EVALUATION COMPLEXITY: Moderate     GOALS: Goals reviewed with patient? No   SHORT TERM GOALS: Target date: 10/08/2022   Patient to demonstrate independence in HEP Baseline:[id number] Goal status: Met   2.  Perform 5x STS test and establish goal as needed Baseline: TBD; 10/12/22 17s arms crossed; 11/09/22 14s Goal status: Met     LONG TERM GOALS: Target date: 10/29/2022     -3d AROM L knee extension Baseline: -6d AROM extension; 11/09/22 -4d Goal status: Progressing   2.  4+/5 L quad strength in TKE Baseline: 4/5 strength in TKE L Goal status: ongoing   3.  4/10 worst pain Baseline: 8/10; 11/09/22 4/10 worst pain, 0/10 best pain Goal status: Met   4.  Negative lateral L patellar pull sign Baseline: Positive L patellar pull sign; 11/09/22 minimal pull with FAQs with adduction Goal status: Met   5.  Increase FOTO score to 59 Baseline: 45; 10/26/22 47; 11/17/22 59 Goal status: Met  6. Increase AROM L  knee to 135d  Baseline: 110d  Goal Status: nitial       PLAN:   PT FREQUENCY: 1x/week   PT DURATION: 4 weeks    PLANNED INTERVENTIONS: Therapeutic exercises, Therapeutic activity, Neuromuscular re-education, Balance training, Gait training, Patient/Family education, Self Care, Joint mobilization, Stair training, Manual therapy, and Re-evaluation   PLAN FOR NEXT SESSION: HEP review and update, ROM, quad strengthening, flexibility, aerobic training   Lanice Shirts, PT 11/17/2022, 2:44 PM

## 2022-11-24 ENCOUNTER — Ambulatory Visit: Payer: Medicare PPO

## 2022-11-24 DIAGNOSIS — M6281 Muscle weakness (generalized): Secondary | ICD-10-CM

## 2022-11-24 DIAGNOSIS — M25562 Pain in left knee: Secondary | ICD-10-CM | POA: Diagnosis not present

## 2022-11-24 DIAGNOSIS — G8929 Other chronic pain: Secondary | ICD-10-CM

## 2022-11-24 NOTE — Therapy (Signed)
OUTPATIENT PHYSICAL THERAPY TREATMENT NOTE   Patient Name: [name] MRN: [id number] DOB:April 12, 1956, 67 y.o., female Today's Date: 11/24/2022  PCP: Rosine Door   REFERRING PROVIDER: Dene Gentry, MD    END OF SESSION:   PT End of Session - 11/24/22 1445     Visit Number 10    Number of Visits 16    Date for PT Re-Evaluation 11/12/22    Authorization Type humana    PT Start Time 1445    PT Stop Time 1530    PT Time Calculation (min) 45 min    Activity Tolerance Patient tolerated treatment well    Behavior During Therapy [facility] for tasks assessed/performed              Past Medical History:  Diagnosis Date   GERD (gastroesophageal reflux disease)    Hyperlipidemia    Lung nodule    left    Osteopenia    Osteoporosis    Squamous cell skin cancer    Right leg   Past Surgical History:  Procedure Laterality Date   BIOPSY  11/02/2019   Procedure: BIOPSY;  Surgeon: Daneil Dolin, MD;  Location: [facility];  Service: Endoscopy;;  gastric   Biopsy right leg Right    skin   COLONOSCOPY N/A 09/17/2017   Dr. Gala Romney: normal colonoscopy.  Next colonoscopy in 10 years.   ESOPHAGOGASTRODUODENOSCOPY (EGD) WITH PROPOFOL N/A 11/02/2019   Dr. Gala Romney: Gastric mucosa coated by thick tenacious bile-stained mucus.  Stomach diffusely injected with scattered focal hemorrhagic erosions but no frank ulcer.  Small hiatal hernia.  Gastric biopsy showed nonspecific reactive gastropathy, no H. pylori.  Duodenum appeared normal.   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 Left knee pain - had lateral meniscus debridement, ACL partial tear debridement, and chondroplasty performed 01/28/22.  She continues to struggle with pain and swelling in this knee now 6 months out from this surgery despite physical therapy, home exercises, intraarticular steroid injection.  Will proceed with MRI to assess for new medial meniscus tear, why she has not improved despite 6 months of conservative treatment.   PRECAUTIONS: none  SUBJECTIVE:  SUBJECTIVE STATEMENT:  Brief 5/10 pain but no need to resort to medication. Cannot identify any limiting tasks.   PAIN:  Are you having pain? Yes: NPRS scale: 7/10 Pain location: L knee  Pain description: ache Aggravating factors: prolonged activity/positions Relieving factors: position changes and rest   OBJECTIVE: (objective measures completed at initial evaluation unless otherwise dated)   DIAGNOSTIC FINDINGS: CLINICAL DATA:  Chronic left knee pain. Patient underwent lateral meniscal debridement, debridement of partial ACL tear and chondroplasty 01/28/2022. Persistent pain and swelling.   EXAM: MRI OF THE LEFT KNEE WITHOUT CONTRAST   TECHNIQUE: Multiplanar, multisequence MR imaging of the knee was performed. No intravenous contrast was administered.   COMPARISON:  MRI 12/19/2021   FINDINGS: MENISCI   Medial meniscus:  Intact with normal morphology.   Lateral meniscus: Interval debridement of the previously demonstrated tear of the anterior horn. No recurrent meniscal  tear or displaced meniscal fragment identified.   LIGAMENTS   Cruciates: The ACL is intact. No definite residual abnormality of the PCL identified.   Collaterals:  Intact.   CARTILAGE   Patellofemoral: Mild lateral patellar chondral thinning and surface irregularity without full-thickness defect or subchondral signal abnormality.   Medial:  Preserved.   Lateral:  Preserved.   MISCELLANEOUS   Joint: Moderate-sized joint effusion, increased in volume from previous study.   Popliteal Fossa: The popliteus muscle and tendon are intact. Small Baker's cyst.   Extensor Mechanism:  Intact.   Bones:  No acute or significant extra-articular osseous findings.   Other: Mild postoperative scarring in Hoffa's fat.   IMPRESSION: 1. Interval debridement of the previously demonstrated tear of the anterior horn of the lateral meniscus. No recurrent meniscal tear or displaced meniscal fragment identified. 2. The medial meniscus, cruciate and collateral ligaments are intact. 3. Moderate-sized joint effusion and small Baker's cyst, increased in volume from previous study. 4. No acute osseous findings.     Electronically Signed   By: Richardean Sale M.D.   On: 08/18/2022 15:53   PATIENT SURVEYS:  FOTO 45(59 predicted); 10/26/22 47; 11/17/22 59   COGNITION: Overall cognitive status: Within functional limits for tasks assessed                EDEMA:  L knee effusion    MUSCLE LENGTH: Hamstrings: Right 80 deg; Left 80 deg Thomas test: negative   POSTURE: No Significant postural limitations   PALPATION: Lateral L patellar pull with quad contraction    LOWER EXTREMITY ROM:   A/PROM Right eval Left eval L 10/26/22 L 11/09/22  Hip flexion [facility]    Hip extension [facility]    Hip abduction [facility]    Hip adduction        Hip internal rotation        Hip external rotation        Knee flexion WFL 110/116 126d   Knee extension WFL -18d/-6 -8/-4d -4/0d  Ankle dorsiflexion         Ankle plantarflexion        Ankle inversion        Ankle eversion         (Blank rows = not tested)   LOWER EXTREMITY MMT:   MMT Right eval Left eval  Hip flexion      Hip extension      Hip abduction      Hip adduction      Hip internal rotation      Hip external rotation  Knee flexion 5 4+  Knee extension 5 4(TKE)  Ankle dorsiflexion      Ankle plantarflexion      Ankle inversion      Ankle eversion       (Blank rows = not tested)   LOWER EXTREMITY SPECIAL TESTS:  Knee special tests: Anterior drawer test: negative, Lachman Test: negative, Lateral pull sign: positive , and Patellafemoral grind test: negative   FUNCTIONAL TESTS:  5 times sit to stand: 10/12/22 17s arms crossed; 11/09/22 14s arms crossed   GAIT: Distance walked: 34f x2 Assistive device utilized: None Level of assistance: Complete Independence Comments: unremarkable     TODAY'S TREATMENT:     [facility] Adult PT Treatment:                                                DATE: 11/24/22 Therapeutic Exercise: Nustep L6 8 min(60-80 SPM) Bridge against RTB 15x Bridge with alternating SLR 15/15 S/L abduction L 15x Sidestpping RTB 10/10 steps Flexion on step 12 in block 15x 126d flexion  [facility] Adult PT Treatment:                                                DATE: 11/17/22 Therapeutic Exercise: Nustep L6 8 min(60-80 SPM) Bridge with ball 15x Single leg bridge 15/15 Supine hip fallouts BluTB single leg 15/15 Manual Therapy: L knee ptaellar mobs  L posterior glide NAGS 3x10 gaining 130d flexion  [facility] Adult PT Treatment:                                                DATE: 11/09/22 Therapeutic Exercise: Cable walk 13# bwd/B side step 5 reps each Nustep L6 8 min(60-80 SPM) FAQs with adduction 5# ball   Therapeutic Activity: ROM and stretch assessment 5x STS 14s arms crossed  [facility]                                                [facility]: [facility] Therapeutic Exercise: QS's 3s hold  15x SLR 5# 15x (to height of other Knee) SAQs 5# 15x SL bridge L 15x L hip fallout GTB (R against bolster) 15x Runner step 4 in L 15x FAQs w/adduction 5#    [facility] Adult PT Treatment:                                                DATE: 10/26/22 Therapeutic Exercise: Treadmill retrowalk 15% 0.8 mph 5 min Gastroc/soleus stretch 30s x2 ea. QS's over heel block 3s hold 15x performed Bil SAQs 5# 15x L abduction 5# 15x FAQs with adduction 5# L SLR 5# 15x   Therapeutic Activity: ROM and FOTO  DATE: 09/17/22  Eval and HEP     PATIENT EDUCATION:  Education details: Discussed eval findings, rehab rationale and POC and patient is in agreement  Person educated: Patient Education method: Explanation Education comprehension: verbalized understanding and needs further education   HOME EXERCISE PROGRAM: AAccess [email or web address] URL: [email or web address] Date: 11/24/2022 Prepared by: Sharlynn Oliphant  Exercises - Supine Bridge with Leg Extension  - 2 x daily - 5 x weekly - 2 sets - 15 reps - Bridge with Resistance  - 2 x daily - 5 x weekly - 2 sets - 15 reps - Side Stepping with Resistance at Thighs  - 2 x daily - 5 x weekly - 2 sets - 10 reps - Standing Knee Flexion Stretch on Step  - 2 x daily - 5 x weekly - 2 sets - 15 reps  ASSESSMENT:   CLINICAL IMPRESSION: Focus today was L hip abduction strengthening and revising her HEP.  Reviewed stair negotiation with L abduction weakness identified.  TKE much improved on L.    OBJECTIVE IMPAIRMENTS: Abnormal gait, decreased activity tolerance, decreased balance, decreased knowledge of condition, decreased ROM, decreased strength, pain, and effusion .    ACTIVITY LIMITATIONS: carrying, lifting, standing, hygiene/grooming, caring for others, and pushing son's WC   PERSONAL FACTORS: Age and Past/current experiences are  also affecting patient's functional outcome.    REHAB POTENTIAL: Fair based on previous unsuccessful conservative treatments    CLINICAL DECISION MAKING: Evolving/moderate complexity   EVALUATION COMPLEXITY: Moderate     GOALS: Goals reviewed with patient? No   SHORT TERM GOALS: Target date: 10/08/2022   Patient to demonstrate independence in HEP Baseline:[id number] Goal status: Met   2.  Perform 5x STS test and establish goal as needed Baseline: TBD; 10/12/22 17s arms crossed; 11/09/22 14s Goal status: Met     LONG TERM GOALS: Target date: 10/29/2022     -3d AROM L knee extension Baseline: -6d AROM extension; 11/09/22 -4d Goal status: Progressing   2.  4+/5 L quad strength in TKE Baseline: 4/5 strength in TKE L Goal status: ongoing   3.  4/10 worst pain Baseline: 8/10; 11/09/22 4/10 worst pain, 0/10 best pain Goal status: Met   4.  Negative lateral L patellar pull sign Baseline: Positive L patellar pull sign; 11/09/22 minimal pull with FAQs with adduction Goal status: Met   5.  Increase FOTO score to 59 Baseline: 45; 10/26/22 47; 11/17/22 59 Goal status: Met  6. Increase AROM L knee to 135d  Baseline: 110d  Goal Status: nitial       PLAN:   PT FREQUENCY: 1x/week   PT DURATION: 4 weeks   PLANNED INTERVENTIONS: Therapeutic exercises, Therapeutic activity, Neuromuscular re-education, Balance training, Gait training, Patient/Family education, Self Care, Joint mobilization, Stair training, Manual therapy, and Re-evaluation   PLAN FOR NEXT SESSION: HEP review and update, ROM, quad strengthening, flexibility, aerobic training   Lanice Shirts, PT 11/24/2022, 3:48 PM    [facility] at [place], [place], [address] Phone: [phone]   Fax:  [phone]  Patient Details  Name: HADLEIGH SCHWAKE MRN: [id number] Date of Birth: 08/13/1956 Referring Provider:  Rosalee Kaufman,  *  Encounter Date: 11/24/2022   Lanice Shirts, PT 11/24/2022, 3:48 PM  [facility] at [place], [place], [address] Phone: [phone]   Fax:  [phone]

## 2022-12-01 ENCOUNTER — Ambulatory Visit: Payer: Medicare PPO

## 2022-12-08 ENCOUNTER — Ambulatory Visit: Payer: Medicare PPO | Attending: Family Medicine

## 2022-12-08 DIAGNOSIS — M25562 Pain in left knee: Secondary | ICD-10-CM | POA: Insufficient documentation

## 2022-12-08 DIAGNOSIS — M6281 Muscle weakness (generalized): Secondary | ICD-10-CM | POA: Diagnosis not present

## 2022-12-08 DIAGNOSIS — G8929 Other chronic pain: Secondary | ICD-10-CM | POA: Insufficient documentation

## 2022-12-08 NOTE — Therapy (Signed)
OUTPATIENT PHYSICAL THERAPY TREATMENT NOTE   Patient Name: [name] MRN: [id number] DOB:[birth year], 67 y.o., female Today's Date: 12/08/2022  PCP: Rosalee Kaufman, PA-C   REFERRING PROVIDER: Dene Gentry, MD    END OF SESSION:   PT End of Session - 12/08/22 1355     Visit Number 11    Number of Visits 12    Date for PT Re-Evaluation 11/12/22    Authorization Type humana    Authorization - Visit Number 11    Authorization - Number of Visits 12    PT Start Time 1400    PT Stop Time 1445    PT Time Calculation (min) 45 min    Activity Tolerance Patient tolerated treatment well    Behavior During Therapy WFL for tasks assessed/performed              Past Medical History:  Diagnosis Date   GERD (gastroesophageal reflux disease)    Hyperlipidemia    Lung nodule    left    Osteopenia    Osteoporosis    Squamous cell skin cancer    Right leg   Past Surgical History:  Procedure Laterality Date   BIOPSY  11/02/2019   Procedure: BIOPSY;  Surgeon: Daneil Dolin, MD;  Location: [facility];  Service: Endoscopy;;  gastric   Biopsy right leg Right    skin   COLONOSCOPY N/A 09/17/2017   Dr. Gala Romney: normal colonoscopy.  Next colonoscopy in 10 years.   ESOPHAGOGASTRODUODENOSCOPY (EGD) WITH PROPOFOL N/A 11/02/2019   Dr. Gala Romney: Gastric mucosa coated by thick tenacious bile-stained mucus.  Stomach diffusely injected with scattered focal hemorrhagic erosions but no frank ulcer.  Small hiatal hernia.  Gastric biopsy showed nonspecific reactive gastropathy, no H. pylori.  Duodenum appeared normal.   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 Left knee pain - had lateral meniscus debridement, ACL partial tear debridement, and chondroplasty performed 01/28/22.  She continues to struggle with pain and swelling in this knee now 6 months out from this surgery despite physical therapy, home exercises, intraarticular steroid injection.  Will proceed with MRI to assess for new medial meniscus tear, why she has not improved despite 6 months of conservative treatment.   PRECAUTIONS: none  SUBJECTIVE:  SUBJECTIVE STATEMENT:  Feels she has regressed as she has had a setback with her mother's health condition.     PAIN:  Are you having pain? Yes: NPRS scale: 7/10 Pain location: L knee  Pain description: ache Aggravating factors: prolonged activity/positions Relieving factors: position changes and rest   OBJECTIVE: (objective measures completed at initial evaluation unless otherwise dated)   DIAGNOSTIC FINDINGS: CLINICAL DATA:  Chronic left knee pain. Patient underwent lateral meniscal debridement, debridement of partial ACL tear and chondroplasty 01/28/2022. Persistent pain and swelling.   EXAM: MRI OF THE LEFT KNEE WITHOUT CONTRAST   TECHNIQUE: Multiplanar, multisequence MR imaging of the knee was performed. No intravenous contrast was administered.   COMPARISON:  MRI 12/19/2021   FINDINGS: MENISCI   Medial meniscus:  Intact with normal morphology.   Lateral meniscus: Interval debridement of the  previously demonstrated tear of the anterior horn. No recurrent meniscal tear or displaced meniscal fragment identified.   LIGAMENTS   Cruciates: The ACL is intact. No definite residual abnormality of the PCL identified.   Collaterals:  Intact.   CARTILAGE   Patellofemoral: Mild lateral patellar chondral thinning and surface irregularity without full-thickness defect or subchondral signal abnormality.   Medial:  Preserved.   Lateral:  Preserved.   MISCELLANEOUS   Joint: Moderate-sized joint effusion, increased in volume from previous study.   Popliteal Fossa: The popliteus muscle and tendon are intact. Small Baker's cyst.   Extensor Mechanism:  Intact.   Bones:  No acute or significant extra-articular osseous findings.   Other: Mild postoperative scarring in Hoffa's fat.   IMPRESSION: 1. Interval debridement of the previously demonstrated tear of the anterior horn of the lateral meniscus. No recurrent meniscal tear or displaced meniscal fragment identified. 2. The medial meniscus, cruciate and collateral ligaments are intact. 3. Moderate-sized joint effusion and small Baker's cyst, increased in volume from previous study. 4. No acute osseous findings.     Electronically Signed   By: Richardean Sale M.D.   On: 08/18/2022 15:53   PATIENT SURVEYS:  FOTO 45(59 predicted); 10/26/22 47; 11/17/22 59   COGNITION: Overall cognitive status: Within functional limits for tasks assessed                EDEMA:  L knee effusion    MUSCLE LENGTH: Hamstrings: Right 80 deg; Left 80 deg Thomas test: negative   POSTURE: No Significant postural limitations   PALPATION: Lateral L patellar pull with quad contraction: 12/08/22 restrictions noted in L patellar retinaculum restricting knee flexion, Tp's noted to L medial haamstring group.   LOWER EXTREMITY ROM:   A/PROM Right eval Left eval L 10/26/22 L 11/09/22 L 12/08/22  Hip flexion [facility]     Hip extension [facility]      Hip abduction [facility]     Hip adduction         Hip internal rotation         Hip external rotation         Knee flexion [facility] 110/116 126d  122(128d following stretch)  Knee extension WFL -18d/-6 -8/-4d -4/0d -6/-2d  Ankle dorsiflexion         Ankle plantarflexion         Ankle inversion         Ankle eversion          (Blank rows = not tested)   LOWER EXTREMITY MMT:   MMT Right eval Left eval L 12/08/22  Hip flexion  Hip extension       Hip abduction       Hip adduction       Hip internal rotation       Hip external rotation       Knee flexion 5 4+ 4+  Knee extension 5 4(TKE) 4+(4 in TKE)  Ankle dorsiflexion       Ankle plantarflexion       Ankle inversion       Ankle eversion        (Blank rows = not tested)   LOWER EXTREMITY SPECIAL TESTS:  Knee special tests: Anterior drawer test: negative, Lachman Test: negative, Lateral pull sign: positive , and Patellafemoral grind test: negative   FUNCTIONAL TESTS:  5 times sit to stand: 10/12/22 17s arms crossed; 11/09/22 14s arms crossed   GAIT: Distance walked: 77f x2 Assistive device utilized: None Level of assistance: Complete Independence Comments: unremarkable     TODAY'S TREATMENT:     [facility] Adult PT Treatment:                                                DATE: 12/08/22 Therapeutic Exercise: Nustep L6 8 min(60-80 SPM) Taping applied to L knee using medial glide technique technique.  Patient instructed to remove tape with any increased pain, skin irritation or in the event the tape loosens and can not be re-applied. Cautioned to never apply Leukotape directly over skin without protective cover roll in place.  PKB L 30s x3  [facility] Adult PT Treatment:                                                DATE: 11/24/22 Therapeutic Exercise: Nustep L6 8 min(60-80 SPM) Bridge against RTB 15x Bridge with alternating SLR 15/15 S/L abduction L 15x Sidestpping RTB 10/10 steps Flexion on step 12 in block 15x 126d  flexion  [facility] Adult PT Treatment:                                                DATE: 11/17/22 Therapeutic Exercise: Nustep L6 8 min(60-80 SPM) Bridge with ball 15x Single leg bridge 15/15 Supine hip fallouts BluTB single leg 15/15 Manual Therapy: L knee ptaellar mobs  L posterior glide NAGS 3x10 gaining 130d flexion  [facility] Adult PT Treatment:                                                DATE: 11/09/22 Therapeutic Exercise: Cable walk 13# bwd/B side step 5 reps each Nustep L6 8 min(60-80 SPM) FAQs with adduction 5# ball   Therapeutic Activity: ROM and stretch assessment 5x STS 14s arms crossed  [facility]                                                [facility]:  11[facility]22 Therapeutic Exercise: QS's 3s hold 15x SLR 5# 15x (to height of other Knee) SAQs 5# 15x SL bridge L 15x L hip fallout GTB (R against bolster) 15x Runner step 4 in L 15x FAQs w/adduction 5#    [facility] Adult PT Treatment:                                                DATE: 10/26/22 Therapeutic Exercise: Treadmill retrowalk 15% 0.8 mph 5 min Gastroc/soleus stretch 30s x2 ea. QS's over heel block 3s hold 15x performed Bil SAQs 5# 15x L abduction 5# 15x FAQs with adduction 5# L SLR 5# 15x   Therapeutic Activity: ROM and FOTO                                                                                                                   DATE: 09/17/22  Eval and HEP     PATIENT EDUCATION:  Education details: Discussed eval findings, rehab rationale and POC and patient is in agreement  Person educated: Patient Education method: Explanation Education comprehension: verbalized understanding and needs further education   HOME EXERCISE PROGRAM: AAccess [email or web address] URL: [email or web address] Date: 11/24/2022 Prepared by: Sharlynn Oliphant  Exercises - Supine Bridge with Leg Extension  - 2 x daily - 5 x weekly - 2 sets - 15 reps - Bridge with Resistance  - 2 x daily - 5 x weekly -  2 sets - 15 reps - Side Stepping with Resistance at Thighs  - 2 x daily - 5 x weekly - 2 sets - 10 reps - Standing Knee Flexion Stretch on Step  - 2 x daily - 5 x weekly - 2 sets - 15 reps  ASSESSMENT:   CLINICAL IMPRESSION: Has regressed in ROM as she has been dealing with her mothers medical issues and has been out of town for extended periods.  Despite this she has been conpliant with HEP.  Re-assessment findings demonstrate TP's in L medial hamstring group potentially limiting L TKE, no firm endfeel to TKE just limited by persistent hamstring tone.  Patient shows a lack of TKE with gait due to co-contraction of quadriceps and hamstring groups.  Soft tissue restrictions noted in L patellar retinaculum restricting medial glide and limiting flexion, applied McConnell medial glide to address restrictions.  HEP updated and modified to address soft tissue restrictions.  Patient would benefit from a additional 4 visits to address neew found resttrictions and build on progress made to date regarding long standing L knee dysfunction.    OBJECTIVE IMPAIRMENTS: Abnormal gait, decreased activity tolerance, decreased balance, decreased knowledge of condition, decreased ROM, decreased strength, pain, and effusion .    ACTIVITY LIMITATIONS: carrying, lifting, standing, hygiene/grooming, caring for others, and pushing son's WC   PERSONAL FACTORS: Age and Past/current experiences are also affecting patient's functional outcome.    REHAB POTENTIAL: Fair based on  previous unsuccessful conservative treatments    CLINICAL DECISION MAKING: Evolving/moderate complexity   EVALUATION COMPLEXITY: Moderate     GOALS: Goals reviewed with patient? No   SHORT TERM GOALS: Target date: 10/08/2022   Patient to demonstrate independence in HEP Baseline:[id number] Goal status: Met   2.  Perform 5x STS test and establish goal as needed Baseline: TBD; 10/12/22 17s arms crossed; 11/09/22 14s Goal status: Met     LONG  TERM GOALS: Target date: 10/29/2022     -3d AROM L knee extension Baseline: -6d AROM extension; 11/09/22 -4d; 12/08/22 -6d Goal status: Ongoing   2.  4+/5 L quad strength in TKE Baseline: 4/5 strength in TKE L Goal status: ongoing   3.  4/10 worst pain Baseline: 8/10; 11/09/22 4/10 worst pain, 0/10 best pain Goal status: Met   4.  Negative lateral L patellar pull sign Baseline: Positive L patellar pull sign; 11/09/22 minimal pull with FAQs with adduction Goal status: Met   5.  Increase FOTO score to 59 Baseline: 45; 10/26/22 47; 11/17/22 59 Goal status: Met  6. Increase AROM L knee to 135d  Baseline: 110d; 12/08/22 122d prior to stretch  Goal Status: Progressing       PLAN:   PT FREQUENCY: 1x/week   PT DURATION: 4 weeks   PLANNED INTERVENTIONS: Therapeutic exercises, Therapeutic activity, Neuromuscular re-education, Balance training, Gait training, Patient/Family education, Self Care, Joint mobilization, Stair training, Manual therapy, and Re-evaluation   PLAN FOR NEXT SESSION: HEP review and update, ROM, quad strengthening, flexibility, aerobic training   Lanice Shirts, PT 12/08/2022, 3:03 PM    [facility] [place], [place], [address] Phone: [phone]   Fax:  [phone]

## 2022-12-15 ENCOUNTER — Ambulatory Visit: Payer: Medicare PPO

## 2022-12-15 DIAGNOSIS — M6281 Muscle weakness (generalized): Secondary | ICD-10-CM

## 2022-12-15 DIAGNOSIS — G8929 Other chronic pain: Secondary | ICD-10-CM | POA: Diagnosis not present

## 2022-12-15 DIAGNOSIS — M25562 Pain in left knee: Secondary | ICD-10-CM | POA: Diagnosis not present

## 2022-12-15 NOTE — Therapy (Signed)
OUTPATIENT PHYSICAL THERAPY TREATMENT NOTE   Patient Name: [name] MRN: [id number] DOB:[birth year], 67 y.o., female Today's Date: 12/15/2022  PCP: Rosalee Kaufman, PA-C   REFERRING PROVIDER: Dene Gentry, MD    END OF SESSION:   PT End of Session - 12/15/22 1452     Visit Number 12    Number of Visits 16    Date for PT Re-Evaluation 11/12/22    Authorization Type humana    Authorization - Number of Visits 12    PT Start Time [id number]    Activity Tolerance Patient tolerated treatment well    Behavior During Therapy [facility] for tasks assessed/performed               Past Medical History:  Diagnosis Date   GERD (gastroesophageal reflux disease)    Hyperlipidemia    Lung nodule    left    Osteopenia    Osteoporosis    Squamous cell skin cancer    Right leg   Past Surgical History:  Procedure Laterality Date   BIOPSY  11/02/2019   Procedure: BIOPSY;  Surgeon: Daneil Dolin, MD;  Location: [facility];  Service: Endoscopy;;  gastric   Biopsy right leg Right    skin   COLONOSCOPY N/A 09/17/2017   Dr. Gala Romney: normal colonoscopy.  Next colonoscopy in 10 years.   ESOPHAGOGASTRODUODENOSCOPY (EGD) WITH PROPOFOL N/A 11/02/2019   Dr. Gala Romney: Gastric mucosa coated by thick tenacious bile-stained mucus.  Stomach diffusely injected with scattered focal hemorrhagic erosions but no frank ulcer.  Small hiatal hernia.  Gastric biopsy showed nonspecific reactive gastropathy, no H. pylori.  Duodenum appeared normal.   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 Left knee pain - had lateral meniscus debridement, ACL partial tear debridement, and chondroplasty performed 01/28/22.  She continues to struggle with pain and swelling in this knee now 6 months out from this surgery despite physical therapy, home exercises, intraarticular steroid injection.  Will proceed with MRI to assess for new medial meniscus tear, why she has not improved despite 6 months of conservative treatment.   PRECAUTIONS: none  SUBJECTIVE:  SUBJECTIVE STATEMENT:  Feels taping last session was a bit helpful as she has more knee flexion   PAIN:  Are you having pain? Yes: NPRS scale: 7/10 Pain location: L knee  Pain description: ache Aggravating factors: prolonged activity/positions Relieving factors: position changes and rest   OBJECTIVE: (objective measures completed at initial evaluation unless otherwise dated)   DIAGNOSTIC FINDINGS: CLINICAL DATA:  Chronic left knee pain. Patient underwent lateral meniscal debridement, debridement of partial ACL tear and chondroplasty 01/28/2022. Persistent pain and swelling.   EXAM: MRI OF THE LEFT KNEE WITHOUT CONTRAST   TECHNIQUE: Multiplanar, multisequence MR imaging of the knee was performed. No intravenous contrast was administered.   COMPARISON:  MRI 12/19/2021   FINDINGS: MENISCI   Medial meniscus:  Intact with normal morphology.   Lateral meniscus: Interval debridement of the previously demonstrated tear of the anterior horn. No recurrent meniscal tear or displaced meniscal fragment  identified.   LIGAMENTS   Cruciates: The ACL is intact. No definite residual abnormality of the PCL identified.   Collaterals:  Intact.   CARTILAGE   Patellofemoral: Mild lateral patellar chondral thinning and surface irregularity without full-thickness defect or subchondral signal abnormality.   Medial:  Preserved.   Lateral:  Preserved.   MISCELLANEOUS   Joint: Moderate-sized joint effusion, increased in volume from previous study.   Popliteal Fossa: The popliteus muscle and tendon are intact. Small Baker's cyst.   Extensor Mechanism:  Intact.   Bones:  No acute or significant extra-articular osseous findings.   Other: Mild postoperative scarring in Hoffa's fat.   IMPRESSION: 1. Interval debridement of the previously demonstrated tear of the anterior horn of the lateral meniscus. No recurrent meniscal tear or displaced meniscal fragment identified. 2. The medial meniscus, cruciate and collateral ligaments are intact. 3. Moderate-sized joint effusion and small Baker's cyst, increased in volume from previous study. 4. No acute osseous findings.     Electronically Signed   By: Richardean Sale M.D.   On: 08/18/2022 15:53   PATIENT SURVEYS:  FOTO 45(59 predicted); 10/26/22 47; 11/17/22 59   COGNITION: Overall cognitive status: Within functional limits for tasks assessed                EDEMA:  L knee effusion    MUSCLE LENGTH: Hamstrings: Right 80 deg; Left 80 deg Thomas test: negative   POSTURE: No Significant postural limitations   PALPATION: Lateral L patellar pull with quad contraction: 12/08/22 restrictions noted in L patellar retinaculum restricting knee flexion, Tp's noted to L medial haamstring group.   LOWER EXTREMITY ROM:   A/PROM Right eval Left eval L 10/26/22 L 11/09/22 L 12/08/22  Hip flexion [facility]     Hip extension [facility]     Hip abduction [facility]     Hip adduction         Hip internal rotation         Hip external rotation          Knee flexion [facility] 110/116 126d  122(128d following stretch)  Knee extension WFL -18d/-6 -8/-4d -4/0d -6/-2d  Ankle dorsiflexion         Ankle plantarflexion         Ankle inversion         Ankle eversion          (Blank rows = not tested)   LOWER EXTREMITY MMT:   MMT Right eval Left eval L 12/08/22  Hip flexion       Hip extension  Hip abduction       Hip adduction       Hip internal rotation       Hip external rotation       Knee flexion 5 4+ 4+  Knee extension 5 4(TKE) 4+(4 in TKE)  Ankle dorsiflexion       Ankle plantarflexion       Ankle inversion       Ankle eversion        (Blank rows = not tested)   LOWER EXTREMITY SPECIAL TESTS:  Knee special tests: Anterior drawer test: negative, Lachman Test: negative, Lateral pull sign: positive , and Patellafemoral grind test: negative   FUNCTIONAL TESTS:  5 times sit to stand: 10/12/22 17s arms crossed; 11/09/22 14s arms crossed   GAIT: Distance walked: 85ft x2 Assistive device utilized: None Level of assistance: Complete Independence Comments: unremarkable     TODAY'S TREATMENT:     [facility] Adult PT Treatment:                                                DATE: 12/15/22 Therapeutic Exercise: Quad set in long sit 15x Prone hang 60s TKE in standing with BluTB 15x Manual Therapy: Skilled palpation of L lateral and medial hamstring muscles to identify irritable taught bands in preparation for TPDN.  No tenderness evoked medially but L biceps femoris muscle TTP Trigger Point Dry Needling Treatment: Pre-treatment instruction: Patient instructed on dry needling rationale, procedures, and possible side effects including pain during treatment (achy,cramping feeling), bruising, drop of blood, lightheadedness, nausea, sweating. Patient Consent Given: Yes Education handout provided: Previously provided Muscles treated: L biceps femoris  Needle size and number: .30x57mm x 2 Electrical stimulation performed: No Parameters:  N/A Treatment response/outcome: Twitch response elicited and Palpable decrease in muscle tension Post-treatment instructions: Patient instructed to expect possible mild to moderate muscle soreness later today and/or tomorrow. Patient instructed in methods to reduce muscle soreness and to continue prescribed HEP. If patient was dry needled over the lung field, patient was instructed on signs and symptoms of pneumothorax and, however unlikely, to see immediate medical attention should they occur. Patient was also educated on signs and symptoms of infection and to seek medical attention should they occur. Patient verbalized understanding of these instructions and education.   [facility] Adult PT Treatment:                                                DATE: 12/08/22 Therapeutic Exercise: Nustep L6 8 min(60-80 SPM) Taping applied to L knee using medial glide technique technique.  Patient instructed to remove tape with any increased pain, skin irritation or in the event the tape loosens and can not be re-applied. Cautioned to never apply Leukotape directly over skin without protective cover roll in place.  PKB L 30s x3  [facility] Adult PT Treatment:                                                DATE: 11/24/22 Therapeutic Exercise: Nustep L6 8 min(60-80 SPM) Bridge against RTB 15x Bridge with alternating SLR 15/15  S/L abduction L 15x Sidestpping RTB 10/10 steps Flexion on step 12 in block 15x 126d flexion  [facility] Adult PT Treatment:                                                DATE: 11/17/22 Therapeutic Exercise: Nustep L6 8 min(60-80 SPM) Bridge with ball 15x Single leg bridge 15/15 Supine hip fallouts BluTB single leg 15/15 Manual Therapy: L knee ptaellar mobs  L posterior glide NAGS 3x10 gaining 130d flexion  [facility] Adult PT Treatment:                                                DATE: 11/09/22 Therapeutic Exercise: Cable walk 13# bwd/B side step 5 reps each Nustep L6 8 min(60-80 SPM) FAQs with  adduction 5# ball   Therapeutic Activity: ROM and stretch assessment 5x STS 14s arms crossed  [facility] Adult PT Treatment:                                                DATE: 11/02/22 Therapeutic Exercise: QS's 3s hold 15x SLR 5# 15x (to height of other Knee) SAQs 5# 15x SL bridge L 15x L hip fallout GTB (R against bolster) 15x Runner step 4 in L 15x FAQs w/adduction 5#    [facility] Adult PT Treatment:                                                DATE: 10/26/22 Therapeutic Exercise: Treadmill retrowalk 15% 0.8 mph 5 min Gastroc/soleus stretch 30s x2 ea. QS's over heel block 3s hold 15x performed Bil SAQs 5# 15x L abduction 5# 15x FAQs with adduction 5# L SLR 5# 15x   Therapeutic Activity: ROM and FOTO                                                                                                                   DATE: 09/17/22  Eval and HEP     PATIENT EDUCATION:  Education details: Discussed eval findings, rehab rationale and POC and patient is in agreement  Person educated: Patient Education method: Explanation Education comprehension: verbalized understanding and needs further education   HOME EXERCISE PROGRAM: AAccess [email or web address] URL: [email or web address] Date: 11/24/2022 Prepared by: Sharlynn Oliphant  Exercises - Supine Bridge with Leg Extension  - 2 x daily - 5 x weekly - 2 sets - 15 reps - Bridge with Resistance  - 2 x daily - 5 x weekly - 2  sets - 15 reps - Side Stepping with Resistance at Thighs  - 2 x daily - 5 x weekly - 2 sets - 10 reps - Standing Knee Flexion Stretch on Step  - 2 x daily - 5 x weekly - 2 sets - 15 reps  ASSESSMENT:   CLINICAL IMPRESSION: Added TPDN to relieve L hamstring tone in biceps femoris.  Still demos a latency in ability to perform a quad set using hip extensors to assist.  Had patient perform in long sit to isolate quadriceps.  Still showing cocontraction of L hamstring group leading to decreased TKE.       OBJECTIVE IMPAIRMENTS: Abnormal gait, decreased activity tolerance, decreased balance, decreased knowledge of condition, decreased ROM, decreased strength, pain, and effusion .    ACTIVITY LIMITATIONS: carrying, lifting, standing, hygiene/grooming, caring for others, and pushing son's WC   PERSONAL FACTORS: Age and Past/current experiences are also affecting patient's functional outcome.    REHAB POTENTIAL: Fair based on previous unsuccessful conservative treatments    CLINICAL DECISION MAKING: Evolving/moderate complexity   EVALUATION COMPLEXITY: Moderate     GOALS: Goals reviewed with patient? No   SHORT TERM GOALS: Target date: 10/08/2022   Patient to demonstrate independence in HEP Baseline:[id number] Goal status: Met   2.  Perform 5x STS test and establish goal as needed Baseline: TBD; 10/12/22 17s arms crossed; 11/09/22 14s Goal status: Met     LONG TERM GOALS: Target date: 10/29/2022     -3d AROM L knee extension Baseline: -6d AROM extension; 11/09/22 -4d; 12/08/22 -6d Goal status: Ongoing   2.  4+/5 L quad strength in TKE Baseline: 4/5 strength in TKE L Goal status: ongoing   3.  4/10 worst pain Baseline: 8/10; 11/09/22 4/10 worst pain, 0/10 best pain Goal status: Met   4.  Negative lateral L patellar pull sign Baseline: Positive L patellar pull sign; 11/09/22 minimal pull with FAQs with adduction Goal status: Met   5.  Increase FOTO score to 59 Baseline: 45; 10/26/22 47; 11/17/22 59 Goal status: Met  6. Increase AROM L knee to 135d  Baseline: 110d; 12/08/22 122d prior to stretch  Goal Status: Progressing       PLAN:   PT FREQUENCY: 1x/week   PT DURATION: 4 weeks   PLANNED INTERVENTIONS: Therapeutic exercises, Therapeutic activity, Neuromuscular re-education, Balance training, Gait training, Patient/Family education, Self Care, Joint mobilization, Stair training, Manual therapy, and Re-evaluation   PLAN FOR NEXT SESSION: HEP review and update, ROM, quad  strengthening, flexibility, aerobic training   Lanice Shirts, PT 12/15/2022, 4:11 PM    [facility] at [place], [place], [address] Phone: [phone]   Fax:  [phone]

## 2022-12-21 ENCOUNTER — Telehealth: Payer: Self-pay

## 2022-12-21 NOTE — Telephone Encounter (Signed)
Patient would like a call to confirm auth and schedule appointments with Merry Proud. She states she was unable to schedule when here last but did not want to schedule without auth on file.

## 2023-01-04 ENCOUNTER — Ambulatory Visit: Payer: Medicare PPO | Attending: Family Medicine

## 2023-01-04 DIAGNOSIS — M25562 Pain in left knee: Secondary | ICD-10-CM | POA: Diagnosis not present

## 2023-01-04 DIAGNOSIS — M6281 Muscle weakness (generalized): Secondary | ICD-10-CM | POA: Diagnosis not present

## 2023-01-04 DIAGNOSIS — G8929 Other chronic pain: Secondary | ICD-10-CM | POA: Insufficient documentation

## 2023-01-04 NOTE — Therapy (Signed)
OUTPATIENT PHYSICAL THERAPY TREATMENT NOTE   Patient Name: [name] MRN: [id number] DOB:1956-05-08, 67 y.o., female Today's Date: 01/04/2023  PCP: Royann Shivers, PA-C   REFERRING PROVIDER: Lenda Kelp, MD    END OF SESSION:   PT End of Session - 01/04/23 1449     Visit Number 13    Number of Visits 16    Date for PT Re-Evaluation 11/12/22    Authorization Type humana    Authorization - Number of Visits 12    PT Start Time 1445    PT Stop Time 1525    PT Time Calculation (min) 40 min    Activity Tolerance Patient tolerated treatment well    Behavior During Therapy WFL for tasks assessed/performed                Past Medical History:  Diagnosis Date   GERD (gastroesophageal reflux disease)    Hyperlipidemia    Lung nodule    left    Osteopenia    Osteoporosis    Squamous cell skin cancer    Right leg   Past Surgical History:  Procedure Laterality Date   BIOPSY  11/02/2019   Procedure: BIOPSY;  Surgeon: Corbin Ade, MD;  Location: [facility];  Service: Endoscopy;;  gastric   Biopsy right leg Right    skin   COLONOSCOPY N/A 09/17/2017   Dr. Jena Gauss: normal colonoscopy.  Next colonoscopy in 10 years.   ESOPHAGOGASTRODUODENOSCOPY (EGD) WITH PROPOFOL N/A 11/02/2019   Dr. Jena Gauss: Gastric mucosa coated by thick tenacious bile-stained mucus.  Stomach diffusely injected with scattered focal hemorrhagic erosions but no frank ulcer.  Small hiatal hernia.  Gastric biopsy showed nonspecific reactive gastropathy, no H. pylori.  Duodenum appeared normal.   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 19/23/2021   Gastritis and gastroduodenitis 12/06/2019   Left sided abdominal pain 09/20/2019   Burping 09/20/2019    Loss of weight 09/20/2019   Change in bowel function 08/12/2017   Constipation 08/12/2017   Bloating 08/12/2017    REFERRING DIAG: M25.562,G89.29 (ICD-10-CM) - Chronic pain of left knee    THERAPY DIAG: Chronic pain of left knee    Rationale for Evaluation and Treatment Rehabilitation  PERTINENT HISTORY: 1. Left knee pain - had lateral meniscus debridement, ACL partial tear debridement, and chondroplasty performed 01/28/22.  She continues to struggle with pain and swelling in this knee now 6 months out from this surgery despite physical therapy, home exercises, intraarticular steroid injection.  Will proceed with MRI to assess for new medial meniscus tear, why she has not improved despite 6 months of conservative treatment.   PRECAUTIONS: none  SUBJECTIVE:  SUBJECTIVE STATEMENT:  Overall better, able to push son's WC up ramp with less discomfort.  Has been sporadic with HEP due to schedule of caring for her son and mother    PAIN:  Are you having pain? Yes: NPRS scale: 7/10 Pain location: L knee  Pain description: ache Aggravating factors: prolonged activity/positions Relieving factors: position changes and rest   OBJECTIVE: (objective measures completed at initial evaluation unless otherwise dated)   DIAGNOSTIC FINDINGS: CLINICAL DATA:  Chronic left knee pain. Patient underwent lateral meniscal debridement, debridement of partial ACL tear and chondroplasty 01/28/2022. Persistent pain and swelling.   EXAM: MRI OF THE LEFT KNEE WITHOUT CONTRAST   TECHNIQUE: Multiplanar, multisequence MR imaging of the knee was performed. No intravenous contrast was administered.   COMPARISON:  MRI 12/19/2021   FINDINGS: MENISCI   Medial meniscus:  Intact with normal morphology.   Lateral meniscus: Interval  debridement of the previously demonstrated tear of the anterior horn. No recurrent meniscal tear or displaced meniscal fragment identified.   LIGAMENTS   Cruciates: The ACL is intact. No definite residual abnormality of the PCL identified.   Collaterals:  Intact.   CARTILAGE   Patellofemoral: Mild lateral patellar chondral thinning and surface irregularity without full-thickness defect or subchondral signal abnormality.   Medial:  Preserved.   Lateral:  Preserved.   MISCELLANEOUS   Joint: Moderate-sized joint effusion, increased in volume from previous study.   Popliteal Fossa: The popliteus muscle and tendon are intact. Small Baker's cyst.   Extensor Mechanism:  Intact.   Bones:  No acute or significant extra-articular osseous findings.   Other: Mild postoperative scarring in Hoffa's fat.   IMPRESSION: 1. Interval debridement of the previously demonstrated tear of the anterior horn of the lateral meniscus. No recurrent meniscal tear or displaced meniscal fragment identified. 2. The medial meniscus, cruciate and collateral ligaments are intact. 3. Moderate-sized joint effusion and small Baker's cyst, increased in volume from previous study. 4. No acute osseous findings.     Electronically Signed   By: Carey Bullocks M.D.   On: 08/18/2022 15:53   PATIENT SURVEYS:  FOTO 45(59 predicted); 10/26/22 47; 11/17/22 59   COGNITION: Overall cognitive status: Within functional limits for tasks assessed                EDEMA:  L knee effusion    MUSCLE LENGTH: Hamstrings: Right 80 deg; Left 80 deg Thomas test: negative   POSTURE: No Significant postural limitations   PALPATION: Lateral L patellar pull with quad contraction: 12/08/22 restrictions noted in L patellar retinaculum restricting knee flexion, Tp's noted to L medial haamstring group.   LOWER EXTREMITY ROM:   A/PROM Right eval Left eval L 10/26/22 L 11/09/22 L 12/08/22 L  01/04/23  Hip flexion [facility]       Hip extension [facility]      Hip abduction [facility]      Hip adduction          Hip internal rotation          Hip external rotation          Knee flexion WFL 110/116 126d  122(128d following stretch) 130d   Knee extension WFL -18d/-6 -8/-4d -4/0d -6/-2d -8/-2d  Ankle dorsiflexion          Ankle plantarflexion          Ankle inversion          Ankle eversion           (  Blank rows = not tested)   LOWER EXTREMITY MMT:   MMT Right eval Left eval L 12/08/22 L 01/04/23  Hip flexion        Hip extension        Hip abduction        Hip adduction        Hip internal rotation        Hip external rotation        Knee flexion 5 4+ 4+ 4+  Knee extension 5 4(TKE) 4+(4 in TKE) 4+(4 TKE)  Ankle dorsiflexion        Ankle plantarflexion        Ankle inversion        Ankle eversion         (Blank rows = not tested)   LOWER EXTREMITY SPECIAL TESTS:  Knee special tests: Anterior drawer test: negative, Lachman Test: negative, Lateral pull sign: positive , and Patellafemoral grind test: negative   FUNCTIONAL TESTS:  5 times sit to stand: 10/12/22 17s arms crossed; 11/09/22 14s arms crossed   GAIT: Distance walked: 67ft x2 Assistive device utilized: None Level of assistance: Complete Independence Comments: unremarkable     TODAY'S TREATMENT:     [facility] Adult PT Treatment:                                                DATE: 01/04/23 Therapeutic Exercise: Prone hang 60s  Gait in clinic focus on TKE TKE GTB 15x Re-assessment of ROM and progress towards goals  [facility] Adult PT Treatment:                                                DATE: 12/15/22 Therapeutic Exercise: Quad set in long sit 15x Prone hang 60s TKE in standing with BluTB 15x Manual Therapy: Skilled palpation of L lateral and medial hamstring muscles to identify irritable taught bands in preparation for TPDN.  No tenderness evoked medially but L biceps femoris muscle TTP Trigger Point Dry Needling Treatment: Pre-treatment  instruction: Patient instructed on dry needling rationale, procedures, and possible side effects including pain during treatment (achy,cramping feeling), bruising, drop of blood, lightheadedness, nausea, sweating. Patient Consent Given: Yes Education handout provided: Previously provided Muscles treated: L biceps femoris  Needle size and number: .30x69mm x 2 Electrical stimulation performed: No Parameters: N/A Treatment response/outcome: Twitch response elicited and Palpable decrease in muscle tension Post-treatment instructions: Patient instructed to expect possible mild to moderate muscle soreness later today and/or tomorrow. Patient instructed in methods to reduce muscle soreness and to continue prescribed HEP. If patient was dry needled over the lung field, patient was instructed on signs and symptoms of pneumothorax and, however unlikely, to see immediate medical attention should they occur. Patient was also educated on signs and symptoms of infection and to seek medical attention should they occur. Patient verbalized understanding of these instructions and education.   [facility] Adult PT Treatment:                                                DATE: 12/08/22 Therapeutic Exercise: Lora Paula  L6 8 min(60-80 SPM) Taping applied to L knee using medial glide technique technique.  Patient instructed to remove tape with any increased pain, skin irritation or in the event the tape loosens and can not be re-applied. Cautioned to never apply Leukotape directly over skin without protective cover roll in place.  PKB L 30s x3                                                                                                                    DATE: 09/17/22  Eval and HEP     PATIENT EDUCATION:  Education details: Discussed eval findings, rehab rationale and POC and patient is in agreement  Person educated: Patient Education method: Explanation Education comprehension: verbalized understanding and needs further  education   HOME EXERCISE PROGRAM: AAccess Code: [email or web address] URL: [email or web address] Date: 11/24/2022 Prepared by: Gustavus BryantJeffrey Spenser Harren  Exercises - Supine Bridge with Leg Extension  - 2 x daily - 5 x weekly - 2 sets - 15 reps - Bridge with Resistance  - 2 x daily - 5 x weekly - 2 sets - 15 reps - Side Stepping with Resistance at Thighs  - 2 x daily - 5 x weekly - 2 sets - 10 reps - Standing Knee Flexion Stretch on Step  - 2 x daily - 5 x weekly - 2 sets - 15 reps  ASSESSMENT:   CLINICAL IMPRESSION: Re-assessment finds increased AROM in L knee flexion.  Minimal benefit to TPDN, continues to inhibit R knee TKE by maintaining tone in L hamstrings.  VMO strength has improved and muscle mas has increased. Gait still shows a lack of TKE as well as L hip abduction weakness.    OBJECTIVE IMPAIRMENTS: Abnormal gait, decreased activity tolerance, decreased balance, decreased knowledge of condition, decreased ROM, decreased strength, pain, and effusion .    ACTIVITY LIMITATIONS: carrying, lifting, standing, hygiene/grooming, caring for others, and pushing son's WC   PERSONAL FACTORS: Age and Past/current experiences are also affecting patient's functional outcome.    REHAB POTENTIAL: Fair based on previous unsuccessful conservative treatments    CLINICAL DECISION MAKING: Evolving/moderate complexity   EVALUATION COMPLEXITY: Moderate     GOALS: Goals reviewed with patient? No   SHORT TERM GOALS: Target date: 10/08/2022   Patient to demonstrate independence in HEP Baseline:[email or web address] Goal status: Met   2.  Perform 5x STS test and establish goal as needed Baseline: TBD; 10/12/22 17s arms crossed; 11/09/22 14s Goal status: Met     LONG TERM GOALS: Target date: 10/29/2022     -3d AROM L knee extension Baseline: -6d AROM extension; 11/09/22 -4d; 12/08/22 -6d Goal status: Ongoing   2.  4+/5 L quad strength in TKE Baseline: 4/5 strength in TKE L Goal status: ongoing   3.  4/10  worst pain Baseline: 8/10; 11/09/22 4/10 worst pain, 0/10 best pain Goal status: Met   4.  Negative lateral L patellar pull sign Baseline: Positive L patellar pull sign; 11/09/22 minimal pull with FAQs with adduction Goal status: Met  5.  Increase FOTO score to 59 Baseline: 45; 10/26/22 47; 11/17/22 59 Goal status: Met  6. Increase AROM L knee to 135d  Baseline: 110d; 12/08/22 122d prior to stretch  Goal Status: Progressing       PLAN:   PT FREQUENCY: 1x/week   PT DURATION: 4 weeks   PLANNED INTERVENTIONS: Therapeutic exercises, Therapeutic activity, Neuromuscular re-education, Balance training, Gait training, Patient/Family education, Self Care, Joint mobilization, Stair training, Manual therapy, and Re-evaluation   PLAN FOR NEXT SESSION: HEP review and update, ROM, quad strengthening, flexibility, aerobic training   Hildred Laser, PT 01/04/2023, 5:06 PM    [facility] at [facility] [address] [place], [place], [address] Phone: [phone]   Fax:  [phone]

## 2023-01-08 NOTE — Therapy (Unsigned)
OUTPATIENT PHYSICAL THERAPY TREATMENT NOTE   Patient Name: [name] MRN: [id number] DOB:1956/06/23, 67 y.o., female Today's Date: 01/11/2023  PCP: Royann Shivers, PA-C   REFERRING PROVIDER: Lenda Kelp, MD    END OF SESSION:   PT End of Session - 01/11/23 1448     Visit Number 14    Number of Visits 16    Date for PT Re-Evaluation 11/12/22    Authorization Type humana    Authorization - Number of Visits 12    PT Start Time 1448    PT Stop Time 1528    PT Time Calculation (min) 40 min    Activity Tolerance Patient tolerated treatment well    Behavior During Therapy WFL for tasks assessed/performed                 Past Medical History:  Diagnosis Date   GERD (gastroesophageal reflux disease)    Hyperlipidemia    Lung nodule    left    Osteopenia    Osteoporosis    Squamous cell skin cancer    Right leg   Past Surgical History:  Procedure Laterality Date   BIOPSY  11/02/2019   Procedure: BIOPSY;  Surgeon: Corbin Ade, MD;  Location: [facility];  Service: Endoscopy;;  gastric   Biopsy right leg Right    skin   COLONOSCOPY N/A 09/17/2017   Dr. Jena Gauss: normal colonoscopy.  Next colonoscopy in 10 years.   ESOPHAGOGASTRODUODENOSCOPY (EGD) WITH PROPOFOL N/A 11/02/2019   Dr. Jena Gauss: Gastric mucosa coated by thick tenacious bile-stained mucus.  Stomach diffusely injected with scattered focal hemorrhagic erosions but no frank ulcer.  Small hiatal hernia.  Gastric biopsy showed nonspecific reactive gastropathy, no H. pylori.  Duodenum appeared normal.   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 Left knee pain - had lateral meniscus debridement, ACL partial tear debridement, and chondroplasty performed 01/28/22.  She continues to struggle with pain and swelling in this knee now 6 months out from this surgery despite physical therapy, home exercises, intraarticular steroid injection.  Will proceed with MRI to assess for new medial meniscus tear, why she has not improved despite 6 months of conservative treatment.   PRECAUTIONS: none  SUBJECTIVE:  SUBJECTIVE STATEMENT:  No change since last session, progress remains.   PAIN:  Are you having pain? Yes: NPRS scale: 7/10 Pain location: L knee  Pain description: ache Aggravating factors: prolonged activity/positions Relieving factors: position changes and rest   OBJECTIVE: (objective measures completed at initial evaluation unless otherwise dated)   DIAGNOSTIC FINDINGS: CLINICAL DATA:  Chronic left knee pain. Patient underwent lateral meniscal debridement, debridement of partial ACL tear and chondroplasty 01/28/2022. Persistent pain and swelling.   EXAM: MRI OF THE LEFT KNEE WITHOUT CONTRAST   TECHNIQUE: Multiplanar, multisequence MR imaging of the knee was performed. No intravenous contrast was administered.   COMPARISON:  MRI 12/19/2021   FINDINGS: MENISCI   Medial meniscus:  Intact with normal morphology.   Lateral meniscus: Interval debridement of the previously demonstrated tear of the anterior horn. No recurrent  meniscal tear or displaced meniscal fragment identified.   LIGAMENTS   Cruciates: The ACL is intact. No definite residual abnormality of the PCL identified.   Collaterals:  Intact.   CARTILAGE   Patellofemoral: Mild lateral patellar chondral thinning and surface irregularity without full-thickness defect or subchondral signal abnormality.   Medial:  Preserved.   Lateral:  Preserved.   MISCELLANEOUS   Joint: Moderate-sized joint effusion, increased in volume from previous study.   Popliteal Fossa: The popliteus muscle and tendon are intact. Small Baker's cyst.   Extensor Mechanism:  Intact.   Bones:  No acute or significant extra-articular osseous findings.   Other: Mild postoperative scarring in Hoffa's fat.   IMPRESSION: 1. Interval debridement of the previously demonstrated tear of the anterior horn of the lateral meniscus. No recurrent meniscal tear or displaced meniscal fragment identified. 2. The medial meniscus, cruciate and collateral ligaments are intact. 3. Moderate-sized joint effusion and small Baker's cyst, increased in volume from previous study. 4. No acute osseous findings.     Electronically Signed   By: Carey Bullocks M.D.   On: 08/18/2022 15:53   PATIENT SURVEYS:  FOTO 45(59 predicted); 10/26/22 47; 11/17/22 59   COGNITION: Overall cognitive status: Within functional limits for tasks assessed                EDEMA:  L knee effusion    MUSCLE LENGTH: Hamstrings: Right 80 deg; Left 80 deg Thomas test: negative   POSTURE: No Significant postural limitations   PALPATION: Lateral L patellar pull with quad contraction: 12/08/22 restrictions noted in L patellar retinaculum restricting knee flexion, Tp's noted to L medial haamstring group.   LOWER EXTREMITY ROM:   A/PROM Right eval Left eval L 10/26/22 L 11/09/22 L 12/08/22 L  01/04/23 L  01/14/23  Hip flexion [facility] WFL       Hip extension [facility]       Hip abduction [facility]       Hip  adduction           Hip internal rotation           Hip external rotation           Knee flexion WFL 110/116 126d  122(128d following stretch) 130d  134d  Knee extension WFL -18d/-6 -8/-4d -4/0d -6/-2d -8/-2d   Ankle dorsiflexion           Ankle plantarflexion           Ankle inversion           Ankle eversion            (Blank rows = not tested)  LOWER EXTREMITY MMT:   MMT Right eval Left eval L 12/08/22 L 01/04/23  Hip flexion        Hip extension        Hip abduction        Hip adduction        Hip internal rotation        Hip external rotation        Knee flexion 5 4+ 4+ 4+  Knee extension 5 4(TKE) 4+(4 in TKE) 4+(4 TKE)  Ankle dorsiflexion        Ankle plantarflexion        Ankle inversion        Ankle eversion         (Blank rows = not tested)   LOWER EXTREMITY SPECIAL TESTS:  Knee special tests: Anterior drawer test: negative, Lachman Test: negative, Lateral pull sign: positive , and Patellafemoral grind test: negative   FUNCTIONAL TESTS:  5 times sit to stand: 10/12/22 17s arms crossed; 11/09/22 14s arms crossed   GAIT: Distance walked: 50ft x2 Assistive device utilized: None Level of assistance: Complete Independence Comments: unremarkable     TODAY'S TREATMENT:     [facility] Adult PT Treatment:                                                DATE: 01/11/23 Therapeutic Exercise: BFR training using QS's as exercise of choice.  70% LOP used for bouts of 30/15/15/15 with 60s recovery periods Supine hip fallouts 15x B, 15/15 unilaterally GTB Heel slides 15x w/strap STS from airex 10x arms crossed, 10x arms crosse R foot forward   [facility] Adult PT Treatment:                                                DATE: 01/04/23 Therapeutic Exercise: Prone hang 60s  Gait in clinic focus on TKE TKE GTB 15x Re-assessment of ROM and progress towards goals  [facility] Adult PT Treatment:                                                DATE: 12/15/22 Therapeutic Exercise: Quad set in long  sit 15x Prone hang 60s TKE in standing with BluTB 15x Manual Therapy: Skilled palpation of L lateral and medial hamstring muscles to identify irritable taught bands in preparation for TPDN.  No tenderness evoked medially but L biceps femoris muscle TTP Trigger Point Dry Needling Treatment: Pre-treatment instruction: Patient instructed on dry needling rationale, procedures, and possible side effects including pain during treatment (achy,cramping feeling), bruising, drop of blood, lightheadedness, nausea, sweating. Patient Consent Given: Yes Education handout provided: Previously provided Muscles treated: L biceps femoris  Needle size and number: .30x48mm x 2 Electrical stimulation performed: No Parameters: N/A Treatment response/outcome: Twitch response elicited and Palpable decrease in muscle tension Post-treatment instructions: Patient instructed to expect possible mild to moderate muscle soreness later today and/or tomorrow. Patient instructed in methods to reduce muscle soreness and to continue prescribed HEP. If patient was dry needled over the lung field, patient was instructed on signs and symptoms of pneumothorax and,  however unlikely, to see immediate medical attention should they occur. Patient was also educated on signs and symptoms of infection and to seek medical attention should they occur. Patient verbalized understanding of these instructions and education.   [facility] Adult PT Treatment:                                                DATE: 12/08/22 Therapeutic Exercise: Nustep L6 8 min(60-80 SPM) Taping applied to L knee using medial glide technique technique.  Patient instructed to remove tape with any increased pain, skin irritation or in the event the tape loosens and can not be re-applied. Cautioned to never apply Leukotape directly over skin without protective cover roll in place.  PKB L 30s x3                                                                                                                     DATE: 09/17/22  Eval and HEP     PATIENT EDUCATION:  Education details: Discussed eval findings, rehab rationale and POC and patient is in agreement  Person educated: Patient Education method: Explanation Education comprehension: verbalized understanding and needs further education   HOME EXERCISE PROGRAM: AAccess Code: [email or web address] URL: [email or web address] Date: 11/24/2022 Prepared by: Gustavus Bryant  Exercises - Supine Bridge with Leg Extension  - 2 x daily - 5 x weekly - 2 sets - 15 reps - Bridge with Resistance  - 2 x daily - 5 x weekly - 2 sets - 15 reps - Side Stepping with Resistance at Thighs  - 2 x daily - 5 x weekly - 2 sets - 10 reps - Standing Knee Flexion Stretch on Step  - 2 x daily - 5 x weekly - 2 sets - 15 reps  ASSESSMENT:   CLINICAL IMPRESSION: Good tolerance to BFR training today.  Continued strengthening of L hip abductors and quadriceps, flexion ROM.  Flexion continues to improve.  Added STS from compliant surfaces.  Biased RLE forward to emphasize LLE workload.    OBJECTIVE IMPAIRMENTS: Abnormal gait, decreased activity tolerance, decreased balance, decreased knowledge of condition, decreased ROM, decreased strength, pain, and effusion .    ACTIVITY LIMITATIONS: carrying, lifting, standing, hygiene/grooming, caring for others, and pushing son's WC   PERSONAL FACTORS: Age and Past/current experiences are also affecting patient's functional outcome.    REHAB POTENTIAL: Fair based on previous unsuccessful conservative treatments    CLINICAL DECISION MAKING: Evolving/moderate complexity   EVALUATION COMPLEXITY: Moderate     GOALS: Goals reviewed with patient? No   SHORT TERM GOALS: Target date: 10/08/2022   Patient to demonstrate independence in HEP Baseline:[id number] Goal status: Met   2.  Perform 5x STS test and establish goal as needed Baseline: TBD; 10/12/22 17s arms crossed; 11/09/22 14s Goal status:  Met     LONG TERM GOALS: Target date: 10/29/2022     -  3d AROM L knee extension Baseline: -6d AROM extension; 11/09/22 -4d; 12/08/22 -6d Goal status: Ongoing   2.  4+/5 L quad strength in TKE Baseline: 4/5 strength in TKE L Goal status: ongoing   3.  4/10 worst pain Baseline: 8/10; 11/09/22 4/10 worst pain, 0/10 best pain Goal status: Met   4.  Negative lateral L patellar pull sign Baseline: Positive L patellar pull sign; 11/09/22 minimal pull with FAQs with adduction Goal status: Met   5.  Increase FOTO score to 59 Baseline: 45; 10/26/22 47; 11/17/22 59 Goal status: Met  6. Increase AROM L knee to 135d  Baseline: 110d; 12/08/22 122d prior to stretch  Goal Status: Progressing       PLAN:   PT FREQUENCY: 1x/week   PT DURATION: 4 weeks   PLANNED INTERVENTIONS: Therapeutic exercises, Therapeutic activity, Neuromuscular re-education, Balance training, Gait training, Patient/Family education, Self Care, Joint mobilization, Stair training, Manual therapy, and Re-evaluation   PLAN FOR NEXT SESSION: HEP review and update, ROM, quad strengthening, flexibility, aerobic training   Hildred Laser, PT 01/11/2023, 3:01 PM     [facility] at [facility] [address] [place], [place], [address] Phone: [phone]   Fax:  [phone]

## 2023-01-11 ENCOUNTER — Ambulatory Visit: Payer: Medicare PPO

## 2023-01-11 DIAGNOSIS — G8929 Other chronic pain: Secondary | ICD-10-CM

## 2023-01-11 DIAGNOSIS — M6281 Muscle weakness (generalized): Secondary | ICD-10-CM | POA: Diagnosis not present

## 2023-01-11 DIAGNOSIS — M25562 Pain in left knee: Secondary | ICD-10-CM | POA: Diagnosis not present

## 2023-01-20 ENCOUNTER — Ambulatory Visit: Payer: Medicare PPO

## 2023-01-26 NOTE — Therapy (Unsigned)
OUTPATIENT PHYSICAL THERAPY TREATMENT NOTE   Patient Name: [name] MRN: [id number] DOB:[birth year], 67 y.o., female Today's Date: 01/27/2023  PCP: Royann Shivers, PA-C   REFERRING PROVIDER: Lenda Kelp, MD    END OF SESSION:   PT End of Session - 01/27/23 1452     Visit Number 15    Number of Visits 16    Date for PT Re-Evaluation 11/12/22    Authorization Type humana    PT Start Time 1450    PT Stop Time 1530    PT Time Calculation (min) 40 min    Activity Tolerance Patient tolerated treatment well    Behavior During Therapy WFL for tasks assessed/performed                  Past Medical History:  Diagnosis Date   GERD (gastroesophageal reflux disease)    Hyperlipidemia    Lung nodule    left    Osteopenia    Osteoporosis    Squamous cell skin cancer    Right leg   Past Surgical History:  Procedure Laterality Date   BIOPSY  11/02/2019   Procedure: BIOPSY;  Surgeon: Corbin Ade, MD;  Location: [facility];  Service: Endoscopy;;  gastric   Biopsy right leg Right    skin   COLONOSCOPY N/A 09/17/2017   Dr. Jena Gauss: normal colonoscopy.  Next colonoscopy in 10 years.   ESOPHAGOGASTRODUODENOSCOPY (EGD) WITH PROPOFOL N/A 11/02/2019   Dr. Jena Gauss: Gastric mucosa coated by thick tenacious bile-stained mucus.  Stomach diffusely injected with scattered focal hemorrhagic erosions but no frank ulcer.  Small hiatal hernia.  Gastric biopsy showed nonspecific reactive gastropathy, no H. pylori.  Duodenum appeared normal.   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 Left knee pain - had lateral meniscus debridement, ACL partial tear debridement, and chondroplasty performed 01/28/22.  She continues to struggle with pain and swelling in this knee now 6 months out from this surgery despite physical therapy, home exercises, intraarticular steroid injection.  Will proceed with MRI to assess for new medial meniscus tear, why she has not improved despite 6 months of conservative treatment.   PRECAUTIONS: none  SUBJECTIVE:  SUBJECTIVE STATEMENT:  No change since last session, progress remains.   PAIN:  Are you having pain? Yes: NPRS scale: 7/10 Pain location: L knee  Pain description: ache Aggravating factors: prolonged activity/positions Relieving factors: position changes and rest   OBJECTIVE: (objective measures completed at initial evaluation unless otherwise dated)   DIAGNOSTIC FINDINGS: CLINICAL DATA:  Chronic left knee pain. Patient underwent lateral meniscal debridement, debridement of partial ACL tear and chondroplasty 01/28/2022. Persistent pain and swelling.   EXAM: MRI OF THE LEFT KNEE WITHOUT CONTRAST   TECHNIQUE: Multiplanar, multisequence MR imaging of the knee was performed. No intravenous contrast was administered.   COMPARISON:  MRI 12/19/2021   FINDINGS: MENISCI   Medial meniscus:  Intact with normal morphology.   Lateral meniscus: Interval debridement of the previously demonstrated tear of the anterior horn. No recurrent meniscal tear or displaced meniscal fragment  identified.   LIGAMENTS   Cruciates: The ACL is intact. No definite residual abnormality of the PCL identified.   Collaterals:  Intact.   CARTILAGE   Patellofemoral: Mild lateral patellar chondral thinning and surface irregularity without full-thickness defect or subchondral signal abnormality.   Medial:  Preserved.   Lateral:  Preserved.   MISCELLANEOUS   Joint: Moderate-sized joint effusion, increased in volume from previous study.   Popliteal Fossa: The popliteus muscle and tendon are intact. Small Baker's cyst.   Extensor Mechanism:  Intact.   Bones:  No acute or significant extra-articular osseous findings.   Other: Mild postoperative scarring in Hoffa's fat.   IMPRESSION: 1. Interval debridement of the previously demonstrated tear of the anterior horn of the lateral meniscus. No recurrent meniscal tear or displaced meniscal fragment identified. 2. The medial meniscus, cruciate and collateral ligaments are intact. 3. Moderate-sized joint effusion and small Baker's cyst, increased in volume from previous study. 4. No acute osseous findings.     Electronically Signed   By: Carey Bullocks M.D.   On: 08/18/2022 15:53   PATIENT SURVEYS:  FOTO 45(59 predicted); 10/26/22 47; 11/17/22 59   COGNITION: Overall cognitive status: Within functional limits for tasks assessed                EDEMA:  L knee effusion    MUSCLE LENGTH: Hamstrings: Right 80 deg; Left 80 deg Thomas test: negative   POSTURE: No Significant postural limitations   PALPATION: Lateral L patellar pull with quad contraction: 12/08/22 restrictions noted in L patellar retinaculum restricting knee flexion, Tp's noted to L medial haamstring group.   LOWER EXTREMITY ROM:   A/PROM Right eval Left eval L 10/26/22 L 11/09/22 L 12/08/22 L  01/04/23 L  01/14/23  Hip flexion [facility] WFL       Hip extension [facility]       Hip abduction [facility]       Hip adduction           Hip internal rotation            Hip external rotation           Knee flexion WFL 110/116 126d  122(128d following stretch) 130d  134d  Knee extension WFL -18d/-6 -8/-4d -4/0d -6/-2d -8/-2d   Ankle dorsiflexion           Ankle plantarflexion           Ankle inversion           Ankle eversion            (Blank rows = not tested)  LOWER EXTREMITY MMT:   MMT Right eval Left eval L 12/08/22 L 01/04/23  Hip flexion        Hip extension        Hip abduction        Hip adduction        Hip internal rotation        Hip external rotation        Knee flexion 5 4+ 4+ 4+  Knee extension 5 4(TKE) 4+(4 in TKE) 4+(4 TKE)  Ankle dorsiflexion        Ankle plantarflexion        Ankle inversion        Ankle eversion         (Blank rows = not tested)   LOWER EXTREMITY SPECIAL TESTS:  Knee special tests: Anterior drawer test: negative, Lachman Test: negative, Lateral pull sign: positive , and Patellafemoral grind test: negative   FUNCTIONAL TESTS:  5 times sit to stand: 10/12/22 17s arms crossed; 11/09/22 14s arms crossed   GAIT: Distance walked: 97ft x2 Assistive device utilized: None Level of assistance: Complete Independence Comments: unremarkable     TODAY'S TREATMENT:     [facility] Adult PT Treatment:                                                DATE: 01/27/23 Therapeutic Exercise: BFR training using QS's as exercise of choice.  70% LOP used for bouts of 30/15/15/15 with 60s recovery periods.  1/2 roll used to negate hip extension/compensation. Supine hip fallouts 15x B, 15/15 unilaterally BluTB Heel slides 15x w/strap 137d flexion  [facility] Adult PT Treatment:                                                DATE: 01/11/23 Therapeutic Exercise: BFR training using QS's as exercise of choice.  70% LOP used for bouts of 30/15/15/15 with 60s recovery periods Supine hip fallouts 15x B, 15/15 unilaterally GTB Heel slides 15x w/strap STS from airex 10x arms crossed, 10x arms crosse R foot forward   [facility] Adult PT Treatment:                                                 DATE: 01/04/23 Therapeutic Exercise: Prone hang 60s  Gait in clinic focus on TKE TKE GTB 15x Re-assessment of ROM and progress towards goals  [facility] Adult PT Treatment:                                                DATE: 12/15/22 Therapeutic Exercise: Quad set in long sit 15x Prone hang 60s TKE in standing with BluTB 15x Manual Therapy: Skilled palpation of L lateral and medial hamstring muscles to identify irritable taught bands in preparation for TPDN.  No tenderness evoked medially but L biceps femoris muscle TTP Trigger Point Dry Needling Treatment: Pre-treatment instruction: Patient instructed on dry needling rationale, procedures, and possible side effects including pain  during treatment (achy,cramping feeling), bruising, drop of blood, lightheadedness, nausea, sweating. Patient Consent Given: Yes Education handout provided: Previously provided Muscles treated: L biceps femoris  Needle size and number: .30x47mm x 2 Electrical stimulation performed: No Parameters: N/A Treatment response/outcome: Twitch response elicited and Palpable decrease in muscle tension Post-treatment instructions: Patient instructed to expect possible mild to moderate muscle soreness later today and/or tomorrow. Patient instructed in methods to reduce muscle soreness and to continue prescribed HEP. If patient was dry needled over the lung field, patient was instructed on signs and symptoms of pneumothorax and, however unlikely, to see immediate medical attention should they occur. Patient was also educated on signs and symptoms of infection and to seek medical attention should they occur. Patient verbalized understanding of these instructions and education.   [facility] Adult PT Treatment:                                                DATE: 12/08/22 Therapeutic Exercise: Nustep L6 8 min(60-80 SPM) Taping applied to L knee using medial glide technique technique.  Patient  instructed to remove tape with any increased pain, skin irritation or in the event the tape loosens and can not be re-applied. Cautioned to never apply Leukotape directly over skin without protective cover roll in place.  PKB L 30s x3                                                                                                                    DATE: 09/17/22  Eval and HEP     PATIENT EDUCATION:  Education details: Discussed eval findings, rehab rationale and POC and patient is in agreement  Person educated: Patient Education method: Explanation Education comprehension: verbalized understanding and needs further education   HOME EXERCISE PROGRAM: AAccess Code: [email or web address] URL: [email or web address] Date: 11/24/2022 Prepared by: Gustavus Bryant  Exercises - Supine Bridge with Leg Extension  - 2 x daily - 5 x weekly - 2 sets - 15 reps - Bridge with Resistance  - 2 x daily - 5 x weekly - 2 sets - 15 reps - Side Stepping with Resistance at Thighs  - 2 x daily - 5 x weekly - 2 sets - 10 reps - Standing Knee Flexion Stretch on Step  - 2 x daily - 5 x weekly - 2 sets - 15 reps  ASSESSMENT:   CLINICAL IMPRESSION: Continued use of L hip extensors while performing TKE.  Patient unable to isolate VMO on either LE and uses hip extensors to extend knee in long sit position however L VMO contraction much more apparent.  Of note, patient also substitutes with hip extensors on contralateral side     OBJECTIVE IMPAIRMENTS: Abnormal gait, decreased activity tolerance, decreased balance, decreased knowledge of condition, decreased ROM, decreased strength, pain, and effusion .    ACTIVITY LIMITATIONS: carrying, lifting, standing, hygiene/grooming, caring for others,  and pushing son's WC   PERSONAL FACTORS: Age and Past/current experiences are also affecting patient's functional outcome.    REHAB POTENTIAL: Fair based on previous unsuccessful conservative treatments    CLINICAL  DECISION MAKING: Evolving/moderate complexity   EVALUATION COMPLEXITY: Moderate     GOALS: Goals reviewed with patient? No   SHORT TERM GOALS: Target date: 10/08/2022   Patient to demonstrate independence in HEP Baseline:[id number] Goal status: Met   2.  Perform 5x STS test and establish goal as needed Baseline: TBD; 10/12/22 17s arms crossed; 11/09/22 14s Goal status: Met     LONG TERM GOALS: Target date: 10/29/2022     -3d AROM L knee extension Baseline: -6d AROM extension; 11/09/22 -4d; 12/08/22 -6d; 01/27/23 -3d in prone  Goal status: Met   2.  4+/5 L quad strength in TKE Baseline: 4/5 strength in TKE L;  Goal status: ongoing   3.  4/10 worst pain Baseline: 8/10; 11/09/22 4/10 worst pain, 0/10 best pain Goal status: Met   4.  Negative lateral L patellar pull sign Baseline: Positive L patellar pull sign; 11/09/22 minimal pull with FAQs with adduction Goal status: Met   5.  Increase FOTO score to 59 Baseline: 45; 10/26/22 47; 11/17/22 59 Goal status: Met  6. Increase AROM L knee to 135d  Baseline: 110d; 12/08/22 122d prior to stretch; 01/27/23 137d  Goal Status: Met       PLAN:   PT FREQUENCY: 1x/week   PT DURATION: 2 weeks   PLANNED INTERVENTIONS: Therapeutic exercises, Therapeutic activity, Neuromuscular re-education, Balance training, Gait training, Patient/Family education, Self Care, Joint mobilization, Stair training, Manual therapy, and Re-evaluation   PLAN FOR NEXT SESSION: HEP review and update, ROM, quad strengthening, flexibility, aerobic training   Hildred Laser, PT 01/27/2023, 3:58 PM    [facility] Outpatient Orthopedic Rehabilitation at [facility] [address] [place], [place], [address] Phone: [phone]   Fax:  [phone]

## 2023-01-27 ENCOUNTER — Ambulatory Visit: Payer: Medicare PPO | Attending: Family Medicine

## 2023-01-27 ENCOUNTER — Inpatient Hospital Stay: Payer: Medicare PPO | Attending: Hematology

## 2023-01-27 DIAGNOSIS — R768 Other specified abnormal immunological findings in serum: Secondary | ICD-10-CM

## 2023-01-27 DIAGNOSIS — G8929 Other chronic pain: Secondary | ICD-10-CM | POA: Insufficient documentation

## 2023-01-27 DIAGNOSIS — K5900 Constipation, unspecified: Secondary | ICD-10-CM | POA: Diagnosis not present

## 2023-01-27 DIAGNOSIS — R911 Solitary pulmonary nodule: Secondary | ICD-10-CM

## 2023-01-27 DIAGNOSIS — M6281 Muscle weakness (generalized): Secondary | ICD-10-CM | POA: Diagnosis not present

## 2023-01-27 DIAGNOSIS — D802 Selective deficiency of immunoglobulin A [IgA]: Secondary | ICD-10-CM | POA: Diagnosis not present

## 2023-01-27 DIAGNOSIS — Z800 Family history of malignant neoplasm of digestive organs: Secondary | ICD-10-CM | POA: Insufficient documentation

## 2023-01-27 DIAGNOSIS — Z806 Family history of leukemia: Secondary | ICD-10-CM | POA: Insufficient documentation

## 2023-01-27 DIAGNOSIS — M25562 Pain in left knee: Secondary | ICD-10-CM | POA: Insufficient documentation

## 2023-01-27 LAB — CBC WITH DIFFERENTIAL/PLATELET
Abs Immature Granulocytes: 0 10*3/uL (ref 0.00–0.07)
Basophils Absolute: 0.1 10*3/uL (ref 0.0–0.1)
Basophils Relative: 1 %
Eosinophils Absolute: 0 10*3/uL (ref 0.0–0.5)
Eosinophils Relative: 1 %
HCT: 39.4 % (ref 36.0–46.0)
Hemoglobin: 13 g/dL (ref 12.0–15.0)
Immature Granulocytes: 0 %
Lymphocytes Relative: 39 %
Lymphs Abs: 2.1 10*3/uL (ref 0.7–4.0)
MCH: 30.3 pg (ref 26.0–34.0)
MCHC: 33 g/dL (ref 30.0–36.0)
MCV: 91.8 fL (ref 80.0–100.0)
Monocytes Absolute: 0.4 10*3/uL (ref 0.1–1.0)
Monocytes Relative: 7 %
Neutro Abs: 2.8 10*3/uL (ref 1.7–7.7)
Neutrophils Relative %: 52 %
Platelets: 280 10*3/uL (ref 150–400)
RBC: 4.29 MIL/uL (ref 3.87–5.11)
RDW: 14.2 % (ref 11.5–15.5)
WBC: 5.5 10*3/uL (ref 4.0–10.5)
nRBC: 0 % (ref 0.0–0.2)

## 2023-01-29 DIAGNOSIS — R3121 Asymptomatic microscopic hematuria: Secondary | ICD-10-CM | POA: Diagnosis not present

## 2023-01-29 LAB — IGG, IGA, IGM
IgA: 68 mg/dL — ABNORMAL LOW (ref 87–352)
IgG (Immunoglobin G), Serum: 571 mg/dL — ABNORMAL LOW (ref 586–1602)
IgM (Immunoglobulin M), Srm: 79 mg/dL (ref 26–217)

## 2023-01-29 NOTE — Therapy (Deleted)
OUTPATIENT PHYSICAL THERAPY TREATMENT NOTE   Patient Name: [name] MRN: [id number] DOB:02/23/1956, 67 y.o., female Today's Date: 01/27/2023  PCP: Skillman, Katherine E, PA-C   REFERRING PROVIDER: Hudnall, Shane R, MD    END OF SESSION:   PT End of Session - 01/27/23 1452     Visit Number 15    Number of Visits 16    Date for PT Re-Evaluation 11/12/22    Authorization Type humana    PT Start Time 1450    PT Stop Time 1530    PT Time Calculation (min) 40 min    Activity Tolerance Patient tolerated treatment well    Behavior During Therapy WFL for tasks assessed/performed                  Past Medical History:  Diagnosis Date   GERD (gastroesophageal reflux disease)    Hyperlipidemia    Lung nodule    left    Osteopenia    Osteoporosis    Squamous cell skin cancer    Right leg   Past Surgical History:  Procedure Laterality Date   BIOPSY  11/02/2019   Procedure: BIOPSY;  Surgeon: Rourk, Robert M, MD;  [facility]: [facility];  Service: Endoscopy;;  gastric   Biopsy right leg Right    skin   COLONOSCOPY N/A 09/17/2017   Dr. Rourk: normal colonoscopy.  Next colonoscopy in 10 years.   ESOPHAGOGASTRODUODENOSCOPY (EGD) WITH PROPOFOL N/A 11/02/2019   Dr. Rourk: Gastric mucosa coated by thick tenacious bile-stained mucus.  Stomach diffusely injected with scattered focal hemorrhagic erosions but no frank ulcer.  Small hiatal hernia.  Gastric biopsy showed nonspecific reactive gastropathy, no H. pylori.  Duodenum appeared normal.   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 Left knee pain - had lateral meniscus debridement, ACL partial tear debridement, and chondroplasty performed 01/28/22.  She continues to struggle with pain and swelling in this knee now 6 months out from this surgery despite physical therapy, home exercises, intraarticular steroid injection.  Will proceed with MRI to assess for new medial meniscus tear, why she has not improved despite 6 months of conservative treatment.   PRECAUTIONS: none  SUBJECTIVE:                                                                                                                                                                                        SUBJECTIVE STATEMENT:  No change since last session, progress remains.   PAIN:  Are you having pain? Yes: NPRS scale: 7/10 Pain location: L knee  Pain description: ache Aggravating factors: prolonged activity/positions Relieving factors: position changes and rest   OBJECTIVE: (objective measures completed at initial evaluation unless otherwise dated)   DIAGNOSTIC FINDINGS: CLINICAL DATA:  Chronic left knee pain. Patient underwent lateral meniscal debridement, debridement of partial ACL tear and chondroplasty 01/28/2022. Persistent pain and swelling.   EXAM: MRI OF THE LEFT KNEE WITHOUT CONTRAST   TECHNIQUE: Multiplanar, multisequence MR imaging of the knee was performed. No intravenous contrast was administered.   COMPARISON:  MRI 12/19/2021   FINDINGS: MENISCI   Medial meniscus:  Intact with normal morphology.   Lateral meniscus: Interval debridement of the previously demonstrated tear of the anterior horn. No recurrent meniscal tear or displaced meniscal fragment  identified.   LIGAMENTS   Cruciates: The ACL is intact. No definite residual abnormality of the PCL identified.   Collaterals:  Intact.   CARTILAGE   Patellofemoral: Mild lateral patellar chondral thinning and surface irregularity without full-thickness defect or subchondral signal abnormality.   Medial:  Preserved.   Lateral:  Preserved.   MISCELLANEOUS   Joint: Moderate-sized joint effusion, increased in volume from previous study.   Popliteal Fossa: The popliteus muscle and tendon are intact. Small Baker's cyst.   Extensor Mechanism:  Intact.   Bones:  No acute or significant extra-articular osseous findings.   Other: Mild postoperative scarring in Hoffa's fat.   IMPRESSION: 1. Interval debridement of the previously demonstrated tear of the anterior horn of the lateral meniscus. No recurrent meniscal tear or displaced meniscal fragment identified. 2. The medial meniscus, cruciate and collateral ligaments are intact. 3. Moderate-sized joint effusion and small Baker's cyst, increased in volume from previous study. 4. No acute osseous findings.     Electronically Signed   By: William  Veazey M.D.   On: 08/18/2022 15:53   PATIENT SURVEYS:  FOTO 45(59 predicted); 10/26/22 47; 11/17/22 59   COGNITION: Overall cognitive status: Within functional limits for tasks assessed                EDEMA:  L knee effusion    MUSCLE LENGTH: Hamstrings: Right 80 deg; Left 80 deg Thomas test: negative   POSTURE: No Significant postural limitations   PALPATION: Lateral L patellar pull with quad contraction: 12/08/22 restrictions noted in L patellar retinaculum restricting knee flexion, Tp's noted to L medial haamstring group.   LOWER EXTREMITY ROM:   A/PROM Right eval Left eval L 10/26/22 L 11/09/22 L 12/08/22 L  01/04/23 L  01/14/23  Hip flexion WFL WFL       Hip extension WFL WFL       Hip abduction WFL WFL       Hip adduction           Hip internal rotation            Hip external rotation           Knee flexion WFL 110/116 126d  122(128d following stretch) 130d  134d  Knee extension WFL -18d/-6 -8/-4d -4/0d -6/-2d -8/-2d   Ankle dorsiflexion           Ankle plantarflexion           Ankle inversion           Ankle eversion            (Blank rows = not tested)     LOWER EXTREMITY MMT:   MMT Right eval Left eval L 12/08/22 L 01/04/23  Hip flexion        Hip extension        Hip abduction        Hip adduction        Hip internal rotation        Hip external rotation        Knee flexion 5 4+ 4+ 4+  Knee extension 5 4(TKE) 4+(4 in TKE) 4+(4 TKE)  Ankle dorsiflexion        Ankle plantarflexion        Ankle inversion        Ankle eversion         (Blank rows = not tested)   LOWER EXTREMITY SPECIAL TESTS:  Knee special tests: Anterior drawer test: negative, Lachman Test: negative, Lateral pull sign: positive , and Patellafemoral grind test: negative   FUNCTIONAL TESTS:  5 times sit to stand: 10/12/22 17s arms crossed; 11/09/22 14s arms crossed   GAIT: Distance walked: 75ft x2 Assistive device utilized: None Level of assistance: Complete Independence Comments: unremarkable     TODAY'S TREATMENT:     [facility] Adult PT Treatment:                                                DATE: 01/27/23 Therapeutic Exercise: BFR training using QS's as exercise of choice.  70% LOP used for bouts of 30/15/15/15 with 60s recovery periods.  1/2 roll used to negate hip extension/compensation. Supine hip fallouts 15x B, 15/15 unilaterally BluTB Heel slides 15x w/strap 137d flexion  [facility] Adult PT Treatment:                                                DATE: 01/11/23 Therapeutic Exercise: BFR training using QS's as exercise of choice.  70% LOP used for bouts of 30/15/15/15 with 60s recovery periods Supine hip fallouts 15x B, 15/15 unilaterally GTB Heel slides 15x w/strap STS from airex 10x arms crossed, 10x arms crosse R foot forward   [facility] Adult PT Treatment:                                                 DATE: 01/04/23 Therapeutic Exercise: Prone hang 60s  Gait in clinic focus on TKE TKE GTB 15x Re-assessment of ROM and progress towards goals  [facility] Adult PT Treatment:                                                DATE: 12/15/22 Therapeutic Exercise: Quad set in long sit 15x Prone hang 60s TKE in standing with BluTB 15x Manual Therapy: Skilled palpation of L lateral and medial hamstring muscles to identify irritable taught bands in preparation for TPDN.  No tenderness evoked medially but L biceps femoris muscle TTP Trigger Point Dry Needling Treatment: Pre-treatment instruction: Patient instructed on dry needling rationale, procedures, and possible side effects including pain   during treatment (achy,cramping feeling), bruising, drop of blood, lightheadedness, nausea, sweating. Patient Consent Given: Yes Education handout provided: Previously provided Muscles treated: L biceps femoris  Needle size and number: .30x75mm x 2 Electrical stimulation performed: No Parameters: N/A Treatment response/outcome: Twitch response elicited and Palpable decrease in muscle tension Post-treatment instructions: Patient instructed to expect possible mild to moderate muscle soreness later today and/or tomorrow. Patient instructed in methods to reduce muscle soreness and to continue prescribed HEP. If patient was dry needled over the lung field, patient was instructed on signs and symptoms of pneumothorax and, however unlikely, to see immediate medical attention should they occur. Patient was also educated on signs and symptoms of infection and to seek medical attention should they occur. Patient verbalized understanding of these instructions and education.   [facility] Adult PT Treatment:                                                DATE: 12/08/22 Therapeutic Exercise: Nustep L6 8 min(60-80 SPM) Taping applied to L knee using medial glide technique technique.  Patient  instructed to remove tape with any increased pain, skin irritation or in the event the tape loosens and can not be re-applied. Cautioned to never apply Leukotape directly over skin without protective cover roll in place.  PKB L 30s x3                                                                                                                    DATE: 09/17/22  Eval and HEP     PATIENT EDUCATION:  Education details: Discussed eval findings, rehab rationale and POC and patient is in agreement  Person educated: Patient Education method: Explanation Education comprehension: verbalized understanding and needs further education   HOME EXERCISE PROGRAM: AAccess Code: [email or web address] URL: [email or web address] Date: 11/24/2022 Prepared by: Garth Diffley  Exercises - Supine Bridge with Leg Extension  - 2 x daily - 5 x weekly - 2 sets - 15 reps - Bridge with Resistance  - 2 x daily - 5 x weekly - 2 sets - 15 reps - Side Stepping with Resistance at Thighs  - 2 x daily - 5 x weekly - 2 sets - 10 reps - Standing Knee Flexion Stretch on Step  - 2 x daily - 5 x weekly - 2 sets - 15 reps  ASSESSMENT:   CLINICAL IMPRESSION: Continued use of L hip extensors while performing TKE.  Patient unable to isolate VMO on either LE and uses hip extensors to extend knee in long sit position however L VMO contraction much more apparent.  Of note, patient also substitutes with hip extensors on contralateral side     OBJECTIVE IMPAIRMENTS: Abnormal gait, decreased activity tolerance, decreased balance, decreased knowledge of condition, decreased ROM, decreased strength, pain, and effusion .    ACTIVITY LIMITATIONS: carrying, lifting, standing, hygiene/grooming, caring for others,   and pushing son's WC   PERSONAL FACTORS: Age and Past/current experiences are also affecting patient's functional outcome.    REHAB POTENTIAL: Fair based on previous unsuccessful conservative treatments    CLINICAL  DECISION MAKING: Evolving/moderate complexity   EVALUATION COMPLEXITY: Moderate     GOALS: Goals reviewed with patient? No   SHORT TERM GOALS: Target date: 10/08/2022   Patient to demonstrate independence in HEP Baseline:[email or web address] Goal status: Met   2.  Perform 5x STS test and establish goal as needed Baseline: TBD; 10/12/22 17s arms crossed; 11/09/22 14s Goal status: Met     LONG TERM GOALS: Target date: 10/29/2022     -3d AROM L knee extension Baseline: -6d AROM extension; 11/09/22 -4d; 12/08/22 -6d; 01/27/23 -3d in prone  Goal status: Met   2.  4+/5 L quad strength in TKE Baseline: 4/5 strength in TKE L;  Goal status: ongoing   3.  4/10 worst pain Baseline: 8/10; 11/09/22 4/10 worst pain, 0/10 best pain Goal status: Met   4.  Negative lateral L patellar pull sign Baseline: Positive L patellar pull sign; 11/09/22 minimal pull with FAQs with adduction Goal status: Met   5.  Increase FOTO score to 59 Baseline: 45; 10/26/22 47; 11/17/22 59 Goal status: Met  6. Increase AROM L knee to 135d  Baseline: 110d; 12/08/22 122d prior to stretch; 01/27/23 137d  Goal Status: Met       PLAN:   PT FREQUENCY: 1x/week   PT DURATION: 2 weeks   PLANNED INTERVENTIONS: Therapeutic exercises, Therapeutic activity, Neuromuscular re-education, Balance training, Gait training, Patient/Family education, Self Care, Joint mobilization, Stair training, Manual therapy, and Re-evaluation   PLAN FOR NEXT SESSION: HEP review and update, ROM, quad strengthening, flexibility, aerobic training   Espyn Radwan M Tonica Brasington, PT 01/27/2023, 3:58 PM    [facility] [address] [place], , [address] Phone: [phone]   Fax:  [phone] 

## 2023-02-02 ENCOUNTER — Ambulatory Visit: Payer: Medicare PPO

## 2023-02-03 ENCOUNTER — Inpatient Hospital Stay: Payer: Medicare PPO | Admitting: Hematology

## 2023-02-11 ENCOUNTER — Encounter: Payer: Self-pay | Admitting: Physician Assistant

## 2023-02-11 ENCOUNTER — Inpatient Hospital Stay (HOSPITAL_BASED_OUTPATIENT_CLINIC_OR_DEPARTMENT_OTHER): Payer: Medicare PPO | Admitting: Physician Assistant

## 2023-02-11 VITALS — BP 138/73 | HR 78 | Temp 98.0°F | Resp 18 | Wt 103.2 lb

## 2023-02-11 DIAGNOSIS — R768 Other specified abnormal immunological findings in serum: Secondary | ICD-10-CM | POA: Diagnosis not present

## 2023-02-11 DIAGNOSIS — Z800 Family history of malignant neoplasm of digestive organs: Secondary | ICD-10-CM | POA: Diagnosis not present

## 2023-02-11 DIAGNOSIS — R911 Solitary pulmonary nodule: Secondary | ICD-10-CM | POA: Diagnosis not present

## 2023-02-11 DIAGNOSIS — D802 Selective deficiency of immunoglobulin A [IgA]: Secondary | ICD-10-CM | POA: Diagnosis not present

## 2023-02-11 DIAGNOSIS — Z806 Family history of leukemia: Secondary | ICD-10-CM | POA: Diagnosis not present

## 2023-02-11 DIAGNOSIS — K5900 Constipation, unspecified: Secondary | ICD-10-CM | POA: Diagnosis not present

## 2023-02-11 NOTE — Progress Notes (Signed)
[facility] [address], [place] [address]   CLINIC:  Medical Oncology/Hematology  PCP:  Sheela Stack [address] [place] / [place] [address]  [phone]  REASON FOR VISIT:  Follow-up for decreased IgA levels   PRIOR THERAPY: none  CURRENT THERAPY: surveillance  INTERVAL HISTORY:  [name], a 67 y.o. female, returns for routine follow-up for her decreased IgA levels. [name] was last seen by Dr. Ellin Saba on 01/27/2022.   Today, [name] reports that her energy levels are fairly stable. She still struggles with chronic fatigue but is able to complete all her ADLs on her own. She reports her appetite is stable but has lost a few lbs over the last few months. She adds that she was recently diagnosed with pancreatic enzyme deficiency in September 2023 and was started on Creon. She has noticed her stools are more firm and does have some constipation. She has a bowel movement daily with incomplete evacuation. She does have some bloating and lower abdominal discomfort. She denies easy bruising or signs of bleeding. She denies any recurrent infections including sinus infections, pneumonias/bronchitis, UTIs. She denies fevers, chills, sweats, shortness of breath, chest pain or cough. She has no other complaints. Rest of the ROS is below.    REVIEW OF SYSTEMS:  Review of Systems  Constitutional:  Positive for fatigue. Negative for appetite change, fever and unexpected weight change.  Respiratory:  Negative for cough and shortness of breath.   Cardiovascular:  Negative for chest pain.  Gastrointestinal:  Positive for constipation. Negative for abdominal pain, blood in stool, diarrhea, nausea, rectal pain and vomiting.  Skin:  Negative for rash.  Neurological:  Negative for dizziness and numbness.  All other systems reviewed and are negative.   PAST MEDICAL/SURGICAL HISTORY:  Past Medical History:  Diagnosis Date   GERD (gastroesophageal reflux  disease)    Hyperlipidemia    Lung nodule    left    Osteopenia    Osteoporosis    Squamous cell skin cancer    Right leg   Past Surgical History:  Procedure Laterality Date   BIOPSY  11/02/2019   Procedure: BIOPSY;  Surgeon: Corbin Ade, MD;  [facility]: [facility];  Service: Endoscopy;;  gastric   Biopsy right leg Right    skin   COLONOSCOPY N/A 09/17/2017   Dr. Jena Gauss: normal colonoscopy.  Next colonoscopy in 10 years.   ESOPHAGOGASTRODUODENOSCOPY (EGD) WITH PROPOFOL N/A 11/02/2019   Dr. Jena Gauss: Gastric mucosa coated by thick tenacious bile-stained mucus.  Stomach diffusely injected with scattered focal hemorrhagic erosions but no frank ulcer.  Small hiatal hernia.  Gastric biopsy showed nonspecific reactive gastropathy, no H. pylori.  Duodenum appeared normal.   TONSILLECTOMY      SOCIAL HISTORY:  Social History   Socioeconomic History   Marital status: Married    Spouse name: Not on file   Number of children: 1   Years of education: Not on file   Highest education level: Not on file  Occupational History   Not on file  Tobacco Use   Smoking status: Never   Smokeless tobacco: Never  Vaping Use   Vaping Use: Never used  Substance and Sexual Activity   Alcohol use: No   Drug use: No   Sexual activity: Yes  Other Topics Concern   Not on file  Social History Narrative   Not on file   Social Determinants of Health   Financial Resource  Strain: Not on file  Food Insecurity: Not on file  Transportation Needs: Not on file  Physical Activity: Not on file  Stress: Not on file  Social Connections: Not on file  Intimate Partner Violence: Not on file    FAMILY HISTORY:  Family History  Problem Relation Age of Onset   Osteoporosis Mother    Kidney disease Mother    Autoimmune disease Mother    Leukemia Mother    Parkinson's disease Father    Dementia Father    Kidney disease Father    Stroke Father    Angina Father    Skin cancer Father    Colon cancer  Cousin    Parkinson's disease Maternal Grandfather    Heart attack Paternal Grandfather    Cerebral palsy Son     CURRENT MEDICATIONS:  Current Outpatient Medications  Medication Sig Dispense Refill   Ascorbic Acid (VITA-C PO) Take by mouth daily.     COLLAGEN PO Take by mouth daily.     cycloSPORINE (RESTASIS) 0.05 % ophthalmic emulsion Place 1 drop into both eyes 2 (two) times daily.     estradiol (ESTRACE) 0.1 MG/GM vaginal cream Place 1 Applicatorful 2 (two) times a week vaginally.      lipase/protease/amylase (CREON) 36000 UNITS CPEP capsule Take 2 capsules with meals and 1 capsule with snacks. Up to 10 per day. Take with first bite of food. 300 capsule 5   Multiple Vitamin (MULTIVITAMIN) tablet Take 1 tablet by mouth daily.     Multiple Vitamins-Minerals (ALGAE BASED CALCIUM PO) Take by mouth 2 (two) times daily.     NON FORMULARY One scoop of protein powder once daily. One scoop of Barley greens powder once daily.     polyethylene glycol powder (GLYCOLAX/MIRALAX) 17 GM/SCOOP powder Take one capful twice daily. Hold for diarrhea. 527 g 5   Probiotic Product (PRO-BIOTIC BLEND PO) Take by mouth.     Strontium Chloride POWD by Does not apply route. capsules     valACYclovir (VALTREX) 1000 MG tablet Take 2,000 mg by mouth 2 (two) times daily as needed (fever blisters).      nitroGLYCERIN (NITRODUR - DOSED IN MG/24 HR) 0.2 mg/hr patch Apply 1/4th patch to affected achilles, change daily (Patient not taking: Reported on 02/11/2023) 30 patch 1   No current facility-administered medications for this visit.    ALLERGIES:  Allergies  Allergen Reactions   Amoxicillin     Severe fatigue  Did it involve swelling of the face/tongue/throat, SOB, or low BP? No Did it involve sudden or severe rash/hives, skin peeling, or any reaction on the inside of your mouth or nose? No Did you need to seek medical attention at a hospital or doctor's office? No When did it last happen?      6 - 7 years If  all above answers are "NO", may proceed with cephalosporin use.    Bactrim [Sulfamethoxazole-Trimethoprim] Hives   Keflex [Cephalexin] Hives   Hydrocodone Nausea Only    PHYSICAL EXAM:  Performance status (ECOG): 1 - Symptomatic but completely ambulatory  Vitals:   02/11/23 0931  BP: 138/73  Pulse: 78  Resp: 18  Temp: 98 F (36.7 C)  SpO2: 99%   Wt Readings from Last 3 Encounters:  02/11/23 103 lb 2.8 oz (46.8 kg)  10/07/22 107 lb (48.5 kg)  09/14/22 107 lb (48.5 kg)   Physical Exam Vitals reviewed.  Constitutional:      Appearance: Normal appearance.  Cardiovascular:     Rate  and Rhythm: Normal rate and regular rhythm.     Heart sounds: Normal heart sounds.  Pulmonary:     Effort: Pulmonary effort is normal.     Breath sounds: Normal breath sounds.  Abdominal:     Palpations: There is no hepatomegaly or splenomegaly.  Musculoskeletal:     Right lower leg: No edema.     Left lower leg: No edema.  Neurological:     General: No focal deficit present.     Mental Status: She is alert and oriented to person, place, and time.  Psychiatric:        Mood and Affect: Mood normal.        Behavior: Behavior normal.     LABORATORY DATA:  I have reviewed the labs as listed.     Latest Ref Rng & Units 01/27/2023    1:23 PM 01/14/2022    9:25 AM 01/20/2021    2:39 PM  CBC  WBC 4.0 - 10.5 K/uL 5.5  4.7  6.2   Hemoglobin 12.0 - 15.0 g/dL 16.1  09.6  04.5   Hematocrit 36.0 - 46.0 % 39.4  39.1  40.2   Platelets 150 - 400 K/uL 280  288  223       Latest Ref Rng & Units 09/14/2022   11:54 AM 07/21/2021    8:17 AM 01/20/2021    2:39 PM  CMP  Glucose 70 - 99 mg/dL 96   86   BUN 8 - 27 mg/dL 14   21   Creatinine 4.09 - 1.00 mg/dL 8.11  9.14  7.82   Sodium 134 - 144 mmol/L 139   135   Potassium 3.5 - 5.2 mmol/L 4.3   4.0   Chloride 96 - 106 mmol/L 99   98   CO2 20 - 29 mmol/L 26   24   Calcium 8.7 - 10.3 mg/dL 9.5   8.5   Total Protein 6.0 - 8.5 g/dL 6.4   6.3   Total  Bilirubin 0.0 - 1.2 mg/dL 0.3   0.7   Alkaline Phos 44 - 121 IU/L 84   62   AST 0 - 40 IU/L 17   25   ALT 0 - 32 IU/L 16   18       Component Value Date/Time   RBC 4.29 01/27/2023 1323   MCV 91.8 01/27/2023 1323   MCV 92 10/03/2019 1345   MCH 30.3 01/27/2023 1323   MCHC 33.0 01/27/2023 1323   RDW 14.2 01/27/2023 1323   RDW 12.4 10/03/2019 1345   LYMPHSABS 2.1 01/27/2023 1323   LYMPHSABS 1.6 10/03/2019 1345   MONOABS 0.4 01/27/2023 1323   EOSABS 0.0 01/27/2023 1323   EOSABS 0.0 10/03/2019 1345   BASOSABS 0.1 01/27/2023 1323   BASOSABS 0.0 10/03/2019 1345    DIAGNOSTIC IMAGING:  I have independently reviewed the scans and discussed with the patient. No results found.   ASSESSMENT:  1.  Mild IgA deficiency: -She was evaluated for low IgA levels of 72. -Repeat immunoglobulin levels show IgA of 76.  IgG was normal.  She does not have any recurrent infections including sinopulmonary infections. -No active intervention necessary.   2.  Left upper lobe pulmonary nodule: -CT chest on 07/16/2020 shows stable 8 x 5 mm subpleural left upper lobe pulmonary nodule with no mediastinal or hilar adenopathy.  Stable biapical pleural and parenchymal scarring type changes. -She is non-smoker. - CT chest without contrast on 01/20/2021: Benign pleural parenchymal scarring in the  posterior left upper lobe, benign.  No suspicious lung nodules.  No further imaging required.   PLAN:  1.  Mild IgA deficiency: - She does not report any recurrent infections in the last 1 year. - No fevers, night sweats or significant weight loss in the last 1 year. - Reviewed labs from 01/27/2023 that show  IgA level is 68 and IgG level is mildly low at 571.  IgM was normal. - CBC was normal. - RTC 1 year for follow-up with repeat labs.       Orders placed this encounter:  No orders of the defined types were placed in this encounter.   Patient expressed understanding of the plan provided.   I have spent a  total of 25 minutes minutes of face-to-face and non-face-to-face time, preparing to see the patient,  performing a medically appropriate examination, counseling and educating the patient,  documenting clinical information in the electronic health record, and care coordination.   Georga Kaufmann PA-C Dept of Hematology and Oncology [facility]

## 2023-02-24 NOTE — Therapy (Unsigned)
OUTPATIENT PHYSICAL THERAPY TREATMENT NOTE/DC SUMMARY   Patient Name: [name] MRN: [id number] DOB:Jan 02, 1956, 67 y.o., female Today's Date: 02/25/2023  PCP: Royann Shivers, PA-C   REFERRING PROVIDER: Lenda Kelp, MD   PHYSICAL THERAPY DISCHARGE SUMMARY  Visits from Start of Care: 16  Current functional level related to goals / functional outcomes: Goals met   Remaining deficits: L hip weakness   Education / Equipment: HEP   Patient agrees to discharge. Patient goals were met. Patient is being discharged due to being pleased with the current functional level.  END OF SESSION:   PT End of Session - 02/25/23 1651     Visit Number 16    Number of Visits 16    Date for PT Re-Evaluation 11/12/22    Authorization Type humana    PT Start Time 1650    PT Stop Time 1735    PT Time Calculation (min) 45 min    Activity Tolerance Patient tolerated treatment well    Behavior During Therapy WFL for tasks assessed/performed                  Past Medical History:  Diagnosis Date   GERD (gastroesophageal reflux disease)    Hyperlipidemia    Lung nodule    left    Osteopenia    Osteoporosis    Squamous cell skin cancer    Right leg   Past Surgical History:  Procedure Laterality Date   BIOPSY  11/02/2019   Procedure: BIOPSY;  Surgeon: Corbin Ade, MD;  [facility]: [facility];  Service: Endoscopy;;  gastric   Biopsy right leg Right    skin   COLONOSCOPY N/A 09/17/2017   Dr. Jena Gauss: normal colonoscopy.  Next colonoscopy in 10 years.   ESOPHAGOGASTRODUODENOSCOPY (EGD) WITH PROPOFOL N/A 11/02/2019   Dr. Jena Gauss: Gastric mucosa coated by thick tenacious bile-stained mucus.  Stomach diffusely injected with scattered focal hemorrhagic erosions but no frank ulcer.  Small hiatal hernia.  Gastric biopsy showed nonspecific reactive gastropathy, no H. pylori.  Duodenum appeared normal.   TONSILLECTOMY     Patient Active Problem List   Diagnosis Date Noted    Epigastric pain 09/14/2022   Diarrhea 09/14/2022   Exocrine pancreatic insufficiency 08/04/2022   Umbilical hernia without obstruction and without gangrene 03/06/2021   Microhematuria 08/07/2020   Low serum IgA for age 46/23/2021   Gastritis and gastroduodenitis 12/06/2019   Left sided abdominal pain 09/20/2019   Burping 09/20/2019   Loss of weight 09/20/2019   Change in bowel function 08/12/2017   Constipation 08/12/2017   Bloating 08/12/2017    REFERRING DIAG: M25.562,G89.29 (ICD-10-CM) - Chronic pain of left knee    THERAPY DIAG: Chronic pain of left knee    Rationale for Evaluation and Treatment Rehabilitation  PERTINENT HISTORY: 1. Left knee pain - had lateral meniscus debridement, ACL partial tear debridement, and chondroplasty performed 01/28/22.  She continues to struggle with pain and swelling in this knee now 6 months out from this surgery despite physical therapy, home exercises, intraarticular steroid injection.  Will proceed with MRI to assess for new medial meniscus tear, why she has not improved despite 6 months of conservative treatment.   PRECAUTIONS: none  SUBJECTIVE:  SUBJECTIVE STATEMENT:  Overall improved, 3/10 pain on average.  Compression socks help.     PAIN:  Are you having pain? Yes: NPRS scale: 2/10 Pain location: L knee  Pain description: ache Aggravating factors: prolonged activity/positions Relieving factors: position changes and rest   OBJECTIVE: (objective measures completed at initial evaluation unless otherwise dated)   DIAGNOSTIC FINDINGS: CLINICAL DATA:  Chronic left knee pain. Patient underwent lateral meniscal debridement, debridement of partial ACL tear and chondroplasty 01/28/2022. Persistent pain and swelling.   EXAM: MRI OF THE LEFT KNEE WITHOUT  CONTRAST   TECHNIQUE: Multiplanar, multisequence MR imaging of the knee was performed. No intravenous contrast was administered.   COMPARISON:  MRI 12/19/2021   FINDINGS: MENISCI   Medial meniscus:  Intact with normal morphology.   Lateral meniscus: Interval debridement of the previously demonstrated tear of the anterior horn. No recurrent meniscal tear or displaced meniscal fragment identified.   LIGAMENTS   Cruciates: The ACL is intact. No definite residual abnormality of the PCL identified.   Collaterals:  Intact.   CARTILAGE   Patellofemoral: Mild lateral patellar chondral thinning and surface irregularity without full-thickness defect or subchondral signal abnormality.   Medial:  Preserved.   Lateral:  Preserved.   MISCELLANEOUS   Joint: Moderate-sized joint effusion, increased in volume from previous study.   Popliteal Fossa: The popliteus muscle and tendon are intact. Small Baker's cyst.   Extensor Mechanism:  Intact.   Bones:  No acute or significant extra-articular osseous findings.   Other: Mild postoperative scarring in Hoffa's fat.   IMPRESSION: 1. Interval debridement of the previously demonstrated tear of the anterior horn of the lateral meniscus. No recurrent meniscal tear or displaced meniscal fragment identified. 2. The medial meniscus, cruciate and collateral ligaments are intact. 3. Moderate-sized joint effusion and small Baker's cyst, increased in volume from previous study. 4. No acute osseous findings.     Electronically Signed   By: Carey Bullocks M.D.   On: 08/18/2022 15:53   PATIENT SURVEYS:  FOTO 45(59 predicted); 10/26/22 47; 11/17/22 59   COGNITION: Overall cognitive status: Within functional limits for tasks assessed                EDEMA:  L knee effusion    MUSCLE LENGTH: Hamstrings: Right 80 deg; Left 80 deg Thomas test: negative   POSTURE: No Significant postural limitations   PALPATION: Lateral L patellar  pull with quad contraction: 12/08/22 restrictions noted in L patellar retinaculum restricting knee flexion, Tp's noted to L medial haamstring group.   LOWER EXTREMITY ROM:   A/PROM Right eval Left eval L 10/26/22 L 11/09/22 L 12/08/22 L  01/04/23 L  01/14/23 L 02/25/23  Hip flexion [facility] WFL        Hip extension [facility]        Hip abduction [facility]        Hip adduction            Hip internal rotation            Hip external rotation            Knee flexion WFL 110/116 126d  122(128d following stretch) 130d  134d 132d  Knee extension WFL -18d/-6 -8/-4d -4/0d -6/-2d -8/-2d  -4d  Ankle dorsiflexion            Ankle plantarflexion            Ankle inversion            Ankle  eversion             (Blank rows = not tested)   LOWER EXTREMITY MMT:   MMT Right eval Left eval L 12/08/22 L 01/04/23 L 02/25/23  Hip flexion         Hip extension         Hip abduction         Hip adduction         Hip internal rotation         Hip external rotation         Knee flexion 5 4+ 4+ 4+ 4+  Knee extension 5 4(TKE) 4+(4 in TKE) 4+(4 TKE) 4+  Ankle dorsiflexion         Ankle plantarflexion         Ankle inversion         Ankle eversion          (Blank rows = not tested)   LOWER EXTREMITY SPECIAL TESTS:  Knee special tests: Anterior drawer test: negative, Lachman Test: negative, Lateral pull sign: positive , and Patellafemoral grind test: negative   FUNCTIONAL TESTS:  5 times sit to stand: 10/12/22 17s arms crossed; 11/09/22 14s arms crossed   GAIT: Distance walked: 74ft x2 Assistive device utilized: None Level of assistance: Complete Independence Comments: unremarkable     TODAY'S TREATMENT:     [facility] Adult PT Treatment:                                                DATE: 02/25/23 Reassessment of progress towards goals  16 steps with alternating pattern no UE support with L abduction weakness identified.  SELF CARE: Discussed and demoed knee sleeves as well as activities to engage in to  regain L hip strength.  [facility] Adult PT Treatment:                                                DATE: 01/27/23 Therapeutic Exercise: BFR training using QS's as exercise of choice.  70% LOP used for bouts of 30/15/15/15 with 60s recovery periods.  1/2 roll used to negate hip extension/compensation. Supine hip fallouts 15x B, 15/15 unilaterally BluTB Heel slides 15x w/strap 137d flexion  [facility] Adult PT Treatment:                                                DATE: 01/11/23 Therapeutic Exercise: BFR training using QS's as exercise of choice.  70% LOP used for bouts of 30/15/15/15 with 60s recovery periods Supine hip fallouts 15x B, 15/15 unilaterally GTB Heel slides 15x w/strap STS from airex 10x arms crossed, 10x arms crosse R foot forward   [facility] Adult PT Treatment:                                                DATE: 01/04/23 Therapeutic Exercise: Prone hang 60s  Gait in clinic focus on TKE TKE GTB 15x Re-assessment of  ROM and progress towards goals  [facility] Adult PT Treatment:                                                DATE: 12/15/22 Therapeutic Exercise: Quad set in long sit 15x Prone hang 60s TKE in standing with BluTB 15x Manual Therapy: Skilled palpation of L lateral and medial hamstring muscles to identify irritable taught bands in preparation for TPDN.  No tenderness evoked medially but L biceps femoris muscle TTP Trigger Point Dry Needling Treatment: Pre-treatment instruction: Patient instructed on dry needling rationale, procedures, and possible side effects including pain during treatment (achy,cramping feeling), bruising, drop of blood, lightheadedness, nausea, sweating. Patient Consent Given: Yes Education handout provided: Previously provided Muscles treated: L biceps femoris  Needle size and number: .30x27mm x 2 Electrical stimulation performed: No Parameters: N/A Treatment response/outcome: Twitch response elicited and Palpable decrease in muscle tension Post-treatment  instructions: Patient instructed to expect possible mild to moderate muscle soreness later today and/or tomorrow. Patient instructed in methods to reduce muscle soreness and to continue prescribed HEP. If patient was dry needled over the lung field, patient was instructed on signs and symptoms of pneumothorax and, however unlikely, to see immediate medical attention should they occur. Patient was also educated on signs and symptoms of infection and to seek medical attention should they occur. Patient verbalized understanding of these instructions and education.   [facility] Adult PT Treatment:                                                DATE: 12/08/22 Therapeutic Exercise: Nustep L6 8 min(60-80 SPM) Taping applied to L knee using medial glide technique technique.  Patient instructed to remove tape with any increased pain, skin irritation or in the event the tape loosens and can not be re-applied. Cautioned to never apply Leukotape directly over skin without protective cover roll in place.  PKB L 30s x3                                                                                                                    DATE: 09/17/22  Eval and HEP     PATIENT EDUCATION:  Education details: Discussed eval findings, rehab rationale and POC and patient is in agreement  Person educated: Patient Education method: Explanation Education comprehension: verbalized understanding and needs further education   HOME EXERCISE PROGRAM: AAccess Code: [email or web address] URL: [email or web address] Date: 11/24/2022 Prepared by: Gustavus Bryant  Exercises - Supine Bridge with Leg Extension  - 2 x daily - 5 x weekly - 2 sets - 15 reps - Bridge with Resistance  - 2 x daily - 5 x weekly - 2 sets - 15 reps - Side Stepping with Resistance at Thighs  -  2 x daily - 5 x weekly - 2 sets - 10 reps - Standing Knee Flexion Stretch on Step  - 2 x daily - 5 x weekly - 2 sets - 15 reps  ASSESSMENT:   CLINICAL IMPRESSION:  All rehab goals met.  Only trace L abduction weakness identified in gluteus medius.  Discussed and demoed knee sleeves as well as activities to engage in to regain L hip strength.    OBJECTIVE IMPAIRMENTS: Abnormal gait, decreased activity tolerance, decreased balance, decreased knowledge of condition, decreased ROM, decreased strength, pain, and effusion .    ACTIVITY LIMITATIONS: carrying, lifting, standing, hygiene/grooming, caring for others, and pushing son's WC   PERSONAL FACTORS: Age and Past/current experiences are also affecting patient's functional outcome.    REHAB POTENTIAL: Fair based on previous unsuccessful conservative treatments    CLINICAL DECISION MAKING: Evolving/moderate complexity   EVALUATION COMPLEXITY: Moderate     GOALS: Goals reviewed with patient? No   SHORT TERM GOALS: Target date: 10/08/2022   Patient to demonstrate independence in HEP Baseline:[id number] Goal status: Met   2.  Perform 5x STS test and establish goal as needed Baseline: TBD; 10/12/22 17s arms crossed; 11/09/22 14s Goal status: Met     LONG TERM GOALS: Target date: 02/27/2023     -3d AROM L knee extension Baseline: -6d AROM extension; 11/09/22 -4d; 12/08/22 -6d; 01/27/23 -3d in prone  Goal status: Met   2.  4+/5 L quad strength in TKE Baseline: 4/5 strength in TKE L; 02/25/23 4+/5 Goal status: Met   3.  4/10 worst pain Baseline: 8/10; 11/09/22 4/10 worst pain, 0/10 best pain Goal status: Met   4.  Negative lateral L patellar pull sign Baseline: Positive L patellar pull sign; 11/09/22 minimal pull with FAQs with adduction Goal status: Met   5.  Increase FOTO score to 59 Baseline: 45; 10/26/22 47; 11/17/22 59 Goal status: Met  6. Increase AROM L knee to 135d  Baseline: 110d; 12/08/22 122d prior to stretch; 01/27/23 137d  Goal Status: Met       PLAN:   PT FREQUENCY: 1x/week   PT DURATION: 2 weeks   PLANNED INTERVENTIONS: Therapeutic exercises, Therapeutic activity, Neuromuscular  re-education, Balance training, Gait training, Patient/Family education, Self Care, Joint mobilization, Stair training, Manual therapy, and Re-evaluation   PLAN FOR NEXT SESSION: DC to HEP   Hildred Laser, PT 02/25/2023, 6:17 PM    [facility] at [facility] [address] [place], [place], [address] Phone: [phone]   Fax:  [phone]

## 2023-02-25 ENCOUNTER — Ambulatory Visit: Payer: Medicare PPO

## 2023-02-25 DIAGNOSIS — M6281 Muscle weakness (generalized): Secondary | ICD-10-CM | POA: Diagnosis not present

## 2023-02-25 DIAGNOSIS — M25562 Pain in left knee: Secondary | ICD-10-CM | POA: Diagnosis not present

## 2023-02-25 DIAGNOSIS — G8929 Other chronic pain: Secondary | ICD-10-CM

## 2023-03-15 DIAGNOSIS — D225 Melanocytic nevi of trunk: Secondary | ICD-10-CM | POA: Diagnosis not present

## 2023-03-15 DIAGNOSIS — L820 Inflamed seborrheic keratosis: Secondary | ICD-10-CM | POA: Diagnosis not present

## 2023-03-15 DIAGNOSIS — L821 Other seborrheic keratosis: Secondary | ICD-10-CM | POA: Diagnosis not present

## 2023-03-15 DIAGNOSIS — D2271 Melanocytic nevi of right lower limb, including hip: Secondary | ICD-10-CM | POA: Diagnosis not present

## 2023-03-15 DIAGNOSIS — D2262 Melanocytic nevi of left upper limb, including shoulder: Secondary | ICD-10-CM | POA: Diagnosis not present

## 2023-03-15 DIAGNOSIS — Z85828 Personal history of other malignant neoplasm of skin: Secondary | ICD-10-CM | POA: Diagnosis not present

## 2023-04-12 DIAGNOSIS — Z01419 Encounter for gynecological examination (general) (routine) without abnormal findings: Secondary | ICD-10-CM | POA: Diagnosis not present

## 2023-05-11 DIAGNOSIS — K5900 Constipation, unspecified: Secondary | ICD-10-CM | POA: Diagnosis not present

## 2023-05-11 DIAGNOSIS — R140 Abdominal distension (gaseous): Secondary | ICD-10-CM | POA: Diagnosis not present

## 2023-05-11 DIAGNOSIS — R1084 Generalized abdominal pain: Secondary | ICD-10-CM | POA: Diagnosis not present

## 2023-06-23 DIAGNOSIS — E785 Hyperlipidemia, unspecified: Secondary | ICD-10-CM | POA: Diagnosis not present

## 2023-06-23 DIAGNOSIS — R911 Solitary pulmonary nodule: Secondary | ICD-10-CM | POA: Diagnosis not present

## 2023-06-23 DIAGNOSIS — I7090 Unspecified atherosclerosis: Secondary | ICD-10-CM | POA: Diagnosis not present

## 2023-06-23 DIAGNOSIS — R7303 Prediabetes: Secondary | ICD-10-CM | POA: Diagnosis not present

## 2023-06-23 DIAGNOSIS — R634 Abnormal weight loss: Secondary | ICD-10-CM | POA: Diagnosis not present

## 2023-06-23 DIAGNOSIS — I890 Lymphedema, not elsewhere classified: Secondary | ICD-10-CM | POA: Diagnosis not present

## 2023-06-28 ENCOUNTER — Encounter: Payer: Self-pay | Admitting: Family Medicine

## 2023-07-01 DIAGNOSIS — R319 Hematuria, unspecified: Secondary | ICD-10-CM | POA: Diagnosis not present

## 2023-07-01 DIAGNOSIS — R7303 Prediabetes: Secondary | ICD-10-CM | POA: Diagnosis not present

## 2023-07-01 DIAGNOSIS — Z0001 Encounter for general adult medical examination with abnormal findings: Secondary | ICD-10-CM | POA: Diagnosis not present

## 2023-07-01 DIAGNOSIS — M810 Age-related osteoporosis without current pathological fracture: Secondary | ICD-10-CM | POA: Diagnosis not present

## 2023-07-01 DIAGNOSIS — R911 Solitary pulmonary nodule: Secondary | ICD-10-CM | POA: Diagnosis not present

## 2023-07-01 DIAGNOSIS — E785 Hyperlipidemia, unspecified: Secondary | ICD-10-CM | POA: Diagnosis not present

## 2023-07-01 DIAGNOSIS — Z681 Body mass index (BMI) 19 or less, adult: Secondary | ICD-10-CM | POA: Diagnosis not present

## 2023-07-01 DIAGNOSIS — R030 Elevated blood-pressure reading, without diagnosis of hypertension: Secondary | ICD-10-CM | POA: Diagnosis not present

## 2023-07-01 DIAGNOSIS — Z23 Encounter for immunization: Secondary | ICD-10-CM | POA: Diagnosis not present

## 2023-07-07 ENCOUNTER — Ambulatory Visit: Payer: Medicare PPO | Admitting: Family Medicine

## 2023-07-07 VITALS — BP 124/68 | Ht 66.0 in

## 2023-07-07 DIAGNOSIS — M79672 Pain in left foot: Secondary | ICD-10-CM

## 2023-07-07 DIAGNOSIS — M25552 Pain in left hip: Secondary | ICD-10-CM | POA: Diagnosis not present

## 2023-07-08 NOTE — Progress Notes (Signed)
PCP: Royann Shivers, PA-C  Subjective:   HPI: Patient is a 67 y.o. female here for left foot and hip pain.  Patient has unfortunately continued to have pain in her left heel. Also with pain posterolateral left hip making it difficult to get comfortable. She did 12 visits of physical therapy and had improvement though pain worsened again.  Nitroglycerin patches caused some skin changes/irritation after using for a while so she stopped.  Past Medical History:  Diagnosis Date   GERD (gastroesophageal reflux disease)    Hyperlipidemia    Lung nodule    left    Osteopenia    Osteoporosis    Squamous cell skin cancer    Right leg    Current Outpatient Medications on File Prior to Visit  Medication Sig Dispense Refill   Ascorbic Acid (VITA-C PO) Take by mouth daily.     COLLAGEN PO Take by mouth daily.     cycloSPORINE (RESTASIS) 0.05 % ophthalmic emulsion Place 1 drop into both eyes 2 (two) times daily.     estradiol (ESTRACE) 0.1 MG/GM vaginal cream Place 1 Applicatorful 2 (two) times a week vaginally.      lipase/protease/amylase (CREON) 36000 UNITS CPEP capsule Take 2 capsules with meals and 1 capsule with snacks. Up to 10 per day. Take with first bite of food. 300 capsule 5   Multiple Vitamin (MULTIVITAMIN) tablet Take 1 tablet by mouth daily.     Multiple Vitamins-Minerals (ALGAE BASED CALCIUM PO) Take by mouth 2 (two) times daily.     nitroGLYCERIN (NITRODUR - DOSED IN MG/24 HR) 0.2 mg/hr patch Apply 1/4th patch to affected achilles, change daily (Patient not taking: Reported on 02/11/2023) 30 patch 1   NON FORMULARY One scoop of protein powder once daily. One scoop of Barley greens powder once daily.     polyethylene glycol powder (GLYCOLAX/MIRALAX) 17 GM/SCOOP powder Take one capful twice daily. Hold for diarrhea. 527 g 5   Probiotic Product (PRO-BIOTIC BLEND PO) Take by mouth.     Strontium Chloride POWD by Does not apply route. capsules     valACYclovir (VALTREX) 1000  MG tablet Take 2,000 mg by mouth 2 (two) times daily as needed (fever blisters).      No current facility-administered medications on file prior to visit.    Past Surgical History:  Procedure Laterality Date   BIOPSY  11/02/2019   Procedure: BIOPSY;  Surgeon: Corbin Ade, MD;  Location: [facility];  Service: Endoscopy;;  gastric   Biopsy right leg Right    skin   COLONOSCOPY N/A 09/17/2017   Dr. Jena Gauss: normal colonoscopy.  Next colonoscopy in 10 years.   ESOPHAGOGASTRODUODENOSCOPY (EGD) WITH PROPOFOL N/A 11/02/2019   Dr. Jena Gauss: Gastric mucosa coated by thick tenacious bile-stained mucus.  Stomach diffusely injected with scattered focal hemorrhagic erosions but no frank ulcer.  Small hiatal hernia.  Gastric biopsy showed nonspecific reactive gastropathy, no H. pylori.  Duodenum appeared normal.   TONSILLECTOMY      Allergies  Allergen Reactions   Amoxicillin     Severe fatigue  Did it involve swelling of the face/tongue/throat, SOB, or low BP? No Did it involve sudden or severe rash/hives, skin peeling, or any reaction on the inside of your mouth or nose? No Did you need to seek medical attention at a hospital or doctor's office? No When did it last happen?      6 - 7 years If all above answers are "NO", may proceed with cephalosporin use.  Bactrim [Sulfamethoxazole-Trimethoprim] Hives   Keflex [Cephalexin] Hives   Hydrocodone Nausea Only    BP 124/68   Ht 5\' 6"  (1.676 m)   BMI 16.65 kg/m       No data to display              No data to display              Objective:  Physical Exam:  Gen: NAD, comfortable in exam room  Left foot/ankle: No gross deformity, swelling, ecchymoses Full range of motion tenderness to palpation achilles at insertion negative ant drawer and negative talar tilt.   Negative thompsons test NV intact distally.  Left hip: No deformity. Full range of motion with 5/5 strength except 5-/5 hip abduction tenderness to palpation  over gluteus muscles, minimal trochanter tenderness. Neurovascularly intact distally. Negative logroll Negative faber, fadir, and piriformis stretches.   Assessment & Plan:  1. Left achilles tendinopathy - did not tolerate nitroglycerin with skin irritation.  Heel lifts, home exercises.  Icing, consider shockwave therapy.  2. Left hip pain - primarily hip abductor and external rotation strain/tendinopathy.  Home exercises reviewed. Tylenol, topical medication if needed.

## 2023-07-11 ENCOUNTER — Other Ambulatory Visit: Payer: Self-pay | Admitting: Gastroenterology

## 2023-08-05 ENCOUNTER — Ambulatory Visit: Payer: Medicare PPO | Admitting: Family Medicine

## 2023-08-05 VITALS — BP 110/72 | Ht 65.0 in | Wt 108.0 lb

## 2023-08-05 DIAGNOSIS — M25552 Pain in left hip: Secondary | ICD-10-CM

## 2023-08-05 DIAGNOSIS — M79672 Pain in left foot: Secondary | ICD-10-CM

## 2023-08-08 ENCOUNTER — Encounter: Payer: Self-pay | Admitting: Family Medicine

## 2023-08-08 NOTE — Progress Notes (Signed)
PCP: Royann Shivers, PA-C  Subjective:   HPI: Patient is a 67 y.o. female here for left foot and hip pain.  10/9: Patient has unfortunately continued to have pain in her left heel. Also with pain posterolateral left hip making it difficult to get comfortable. She did 12 visits of physical therapy and had improvement though pain worsened again.  Nitroglycerin patches caused some skin changes/irritation after using for a while so she stopped.  10/7: Patient reports overall she's doing well. However, having more left knee pain. She's doing her home exercises for her left hip and achilles though notes she was not doing the achilles exercises correctly and could be doing hip exercises more - these are difficult though and only able to do 2 sets of 10. No new injuries.  Past Medical History:  Diagnosis Date   GERD (gastroesophageal reflux disease)    Hyperlipidemia    Lung nodule    left    Osteopenia    Osteoporosis    Squamous cell skin cancer    Right leg    Current Outpatient Medications on File Prior to Visit  Medication Sig Dispense Refill   Ascorbic Acid (VITA-C PO) Take by mouth daily.     COLLAGEN PO Take by mouth daily.     CREON 36000-114000 units CPEP capsule TAKE 2 CAPSULES BY MOUTH WITH MEALS, AND 1 CAPSULE WITH SNACKS. TAKE UP TO 10 CAPSULES PER DAY, AND TAKE WITH FIRST BITE OF FOOD. 300 capsule 5   cycloSPORINE (RESTASIS) 0.05 % ophthalmic emulsion Place 1 drop into both eyes 2 (two) times daily.     estradiol (ESTRACE) 0.1 MG/GM vaginal cream Place 1 Applicatorful 2 (two) times a week vaginally.      Multiple Vitamin (MULTIVITAMIN) tablet Take 1 tablet by mouth daily.     Multiple Vitamins-Minerals (ALGAE BASED CALCIUM PO) Take by mouth 2 (two) times daily.     nitroGLYCERIN (NITRODUR - DOSED IN MG/24 HR) 0.2 mg/hr patch Apply 1/4th patch to affected achilles, change daily (Patient not taking: Reported on 02/11/2023) 30 patch 1   NON FORMULARY One scoop of  protein powder once daily. One scoop of Barley greens powder once daily.     polyethylene glycol powder (GLYCOLAX/MIRALAX) 17 GM/SCOOP powder Take one capful twice daily. Hold for diarrhea. 527 g 5   Probiotic Product (PRO-BIOTIC BLEND PO) Take by mouth.     Strontium Chloride POWD by Does not apply route. capsules     valACYclovir (VALTREX) 1000 MG tablet Take 2,000 mg by mouth 2 (two) times daily as needed (fever blisters).      No current facility-administered medications on file prior to visit.    Past Surgical History:  Procedure Laterality Date   BIOPSY  11/02/2019   Procedure: BIOPSY;  Surgeon: Corbin Ade, MD;  [facility]: [facility];  Service: Endoscopy;;  gastric   Biopsy right leg Right    skin   COLONOSCOPY N/A 09/17/2017   Dr. Jena Gauss: normal colonoscopy.  Next colonoscopy in 10 years.   ESOPHAGOGASTRODUODENOSCOPY (EGD) WITH PROPOFOL N/A 11/02/2019   Dr. Jena Gauss: Gastric mucosa coated by thick tenacious bile-stained mucus.  Stomach diffusely injected with scattered focal hemorrhagic erosions but no frank ulcer.  Small hiatal hernia.  Gastric biopsy showed nonspecific reactive gastropathy, no H. pylori.  Duodenum appeared normal.   TONSILLECTOMY      Allergies  Allergen Reactions   Amoxicillin     Severe fatigue  Did it involve swelling of the face/tongue/throat, SOB,  or low BP? No Did it involve sudden or severe rash/hives, skin peeling, or any reaction on the inside of your mouth or nose? No Did you need to seek medical attention at a hospital or doctor's office? No When did it last happen?      6 - 7 years If all above answers are "NO", may proceed with cephalosporin use.    Bactrim [Sulfamethoxazole-Trimethoprim] Hives   Keflex [Cephalexin] Hives   Hydrocodone Nausea Only    BP 110/72   Ht 5\' 5"  (1.651 m)   Wt 108 lb (49 kg)   BMI 17.97 kg/m       No data to display              No data to display              Objective:  Physical Exam:  Gen:  NAD, comfortable in exam room  Left foot/ankle: No gross deformity, swelling, ecchymoses Full range of motion Mild tenderness to palpation achilles tendon at insertion NV intact distally.  Left hip: No deformity. Full range of motion with 5/5 strength except 5-/5 hip abduction. Tenderness to palpation greater trochanter, gluteal muscles. Neurovascularly intact distally. Negative logroll Negative faber, fadir, and piriformis stretches.   Assessment & Plan:  1. Left achilles tendinopathy - will continue with home exercises - demonstrated proper technique today with ATc.  Did not tolerate nitroglycerin.  Heel lifts, icing.  Consider shockwave if not improving.  2. Left hip pain - not much change.  Encouraged more regular exercises, shown how to modify these if too difficult.  Tylenol, topical medication if needed.

## 2023-08-30 DIAGNOSIS — R140 Abdominal distension (gaseous): Secondary | ICD-10-CM | POA: Diagnosis not present

## 2023-08-30 DIAGNOSIS — K3189 Other diseases of stomach and duodenum: Secondary | ICD-10-CM | POA: Diagnosis not present

## 2023-08-30 DIAGNOSIS — Q432 Other congenital functional disorders of colon: Secondary | ICD-10-CM | POA: Diagnosis not present

## 2023-08-30 DIAGNOSIS — R634 Abnormal weight loss: Secondary | ICD-10-CM | POA: Diagnosis not present

## 2023-08-30 DIAGNOSIS — R198 Other specified symptoms and signs involving the digestive system and abdomen: Secondary | ICD-10-CM | POA: Diagnosis not present

## 2023-09-02 DIAGNOSIS — R140 Abdominal distension (gaseous): Secondary | ICD-10-CM | POA: Diagnosis not present

## 2023-09-02 DIAGNOSIS — Z0389 Encounter for observation for other suspected diseases and conditions ruled out: Secondary | ICD-10-CM | POA: Diagnosis not present

## 2023-09-13 DIAGNOSIS — R030 Elevated blood-pressure reading, without diagnosis of hypertension: Secondary | ICD-10-CM | POA: Diagnosis not present

## 2023-09-13 DIAGNOSIS — B029 Zoster without complications: Secondary | ICD-10-CM | POA: Diagnosis not present

## 2023-09-13 DIAGNOSIS — Z681 Body mass index (BMI) 19 or less, adult: Secondary | ICD-10-CM | POA: Diagnosis not present

## 2023-10-20 DIAGNOSIS — R634 Abnormal weight loss: Secondary | ICD-10-CM | POA: Diagnosis not present

## 2023-10-20 DIAGNOSIS — R140 Abdominal distension (gaseous): Secondary | ICD-10-CM | POA: Diagnosis not present

## 2023-10-27 DIAGNOSIS — Z1231 Encounter for screening mammogram for malignant neoplasm of breast: Secondary | ICD-10-CM | POA: Diagnosis not present

## 2023-11-22 DIAGNOSIS — Z780 Asymptomatic menopausal state: Secondary | ICD-10-CM | POA: Diagnosis not present

## 2023-11-22 DIAGNOSIS — M85852 Other specified disorders of bone density and structure, left thigh: Secondary | ICD-10-CM | POA: Diagnosis not present

## 2023-11-22 DIAGNOSIS — M810 Age-related osteoporosis without current pathological fracture: Secondary | ICD-10-CM | POA: Diagnosis not present

## 2023-12-18 DIAGNOSIS — B351 Tinea unguium: Secondary | ICD-10-CM | POA: Diagnosis not present

## 2023-12-18 DIAGNOSIS — L84 Corns and callosities: Secondary | ICD-10-CM | POA: Diagnosis not present

## 2023-12-18 DIAGNOSIS — Z681 Body mass index (BMI) 19 or less, adult: Secondary | ICD-10-CM | POA: Diagnosis not present

## 2024-01-04 DIAGNOSIS — M79674 Pain in right toe(s): Secondary | ICD-10-CM | POA: Diagnosis not present

## 2024-01-04 DIAGNOSIS — M79675 Pain in left toe(s): Secondary | ICD-10-CM | POA: Diagnosis not present

## 2024-01-04 DIAGNOSIS — B351 Tinea unguium: Secondary | ICD-10-CM | POA: Diagnosis not present

## 2024-01-17 ENCOUNTER — Encounter: Payer: Self-pay | Admitting: Family Medicine

## 2024-02-01 DIAGNOSIS — L821 Other seborrheic keratosis: Secondary | ICD-10-CM | POA: Diagnosis not present

## 2024-02-01 DIAGNOSIS — D1801 Hemangioma of skin and subcutaneous tissue: Secondary | ICD-10-CM | POA: Diagnosis not present

## 2024-02-01 DIAGNOSIS — D1722 Benign lipomatous neoplasm of skin and subcutaneous tissue of left arm: Secondary | ICD-10-CM | POA: Diagnosis not present

## 2024-02-01 DIAGNOSIS — L570 Actinic keratosis: Secondary | ICD-10-CM | POA: Diagnosis not present

## 2024-02-01 DIAGNOSIS — L820 Inflamed seborrheic keratosis: Secondary | ICD-10-CM | POA: Diagnosis not present

## 2024-02-01 DIAGNOSIS — Z85828 Personal history of other malignant neoplasm of skin: Secondary | ICD-10-CM | POA: Diagnosis not present

## 2024-02-02 ENCOUNTER — Other Ambulatory Visit: Payer: Self-pay

## 2024-02-02 DIAGNOSIS — R768 Other specified abnormal immunological findings in serum: Secondary | ICD-10-CM

## 2024-02-03 ENCOUNTER — Ambulatory Visit: Admitting: Family Medicine

## 2024-02-03 ENCOUNTER — Inpatient Hospital Stay: Payer: Medicare PPO

## 2024-02-03 ENCOUNTER — Inpatient Hospital Stay: Attending: Hematology

## 2024-02-03 VITALS — BP 130/64 | Ht 65.5 in | Wt 114.0 lb

## 2024-02-03 DIAGNOSIS — Z808 Family history of malignant neoplasm of other organs or systems: Secondary | ICD-10-CM | POA: Diagnosis not present

## 2024-02-03 DIAGNOSIS — M25562 Pain in left knee: Secondary | ICD-10-CM | POA: Diagnosis not present

## 2024-02-03 DIAGNOSIS — Z800 Family history of malignant neoplasm of digestive organs: Secondary | ICD-10-CM | POA: Insufficient documentation

## 2024-02-03 DIAGNOSIS — R911 Solitary pulmonary nodule: Secondary | ICD-10-CM | POA: Insufficient documentation

## 2024-02-03 DIAGNOSIS — Z806 Family history of leukemia: Secondary | ICD-10-CM | POA: Insufficient documentation

## 2024-02-03 DIAGNOSIS — M25552 Pain in left hip: Secondary | ICD-10-CM | POA: Diagnosis not present

## 2024-02-03 DIAGNOSIS — R768 Other specified abnormal immunological findings in serum: Secondary | ICD-10-CM

## 2024-02-03 DIAGNOSIS — D802 Selective deficiency of immunoglobulin A [IgA]: Secondary | ICD-10-CM | POA: Insufficient documentation

## 2024-02-03 DIAGNOSIS — M545 Low back pain, unspecified: Secondary | ICD-10-CM

## 2024-02-03 DIAGNOSIS — G8929 Other chronic pain: Secondary | ICD-10-CM | POA: Diagnosis not present

## 2024-02-03 LAB — CBC WITH DIFFERENTIAL/PLATELET
Abs Immature Granulocytes: 0 10*3/uL (ref 0.00–0.07)
Basophils Absolute: 0.1 10*3/uL (ref 0.0–0.1)
Basophils Relative: 1 %
Eosinophils Absolute: 0.1 10*3/uL (ref 0.0–0.5)
Eosinophils Relative: 3 %
HCT: 40.1 % (ref 36.0–46.0)
Hemoglobin: 13.7 g/dL (ref 12.0–15.0)
Immature Granulocytes: 0 %
Lymphocytes Relative: 47 %
Lymphs Abs: 2.6 10*3/uL (ref 0.7–4.0)
MCH: 31.3 pg (ref 26.0–34.0)
MCHC: 34.2 g/dL (ref 30.0–36.0)
MCV: 91.6 fL (ref 80.0–100.0)
Monocytes Absolute: 0.4 10*3/uL (ref 0.1–1.0)
Monocytes Relative: 8 %
Neutro Abs: 2.2 10*3/uL (ref 1.7–7.7)
Neutrophils Relative %: 41 %
Platelets: 269 10*3/uL (ref 150–400)
RBC: 4.38 MIL/uL (ref 3.87–5.11)
RDW: 13.2 % (ref 11.5–15.5)
WBC: 5.4 10*3/uL (ref 4.0–10.5)
nRBC: 0 % (ref 0.0–0.2)

## 2024-02-03 LAB — COMPREHENSIVE METABOLIC PANEL WITH GFR
ALT: 16 U/L (ref 0–44)
AST: 23 U/L (ref 15–41)
Albumin: 4.1 g/dL (ref 3.5–5.0)
Alkaline Phosphatase: 76 U/L (ref 38–126)
Anion gap: 9 (ref 5–15)
BUN: 12 mg/dL (ref 8–23)
CO2: 28 mmol/L (ref 22–32)
Calcium: 9 mg/dL (ref 8.9–10.3)
Chloride: 96 mmol/L — ABNORMAL LOW (ref 98–111)
Creatinine, Ser: 0.73 mg/dL (ref 0.44–1.00)
GFR, Estimated: >60 mL/min (ref >60)
Glucose, Bld: 103 mg/dL — ABNORMAL HIGH (ref 70–99)
Potassium: 4 mmol/L (ref 3.5–5.1)
Sodium: 133 mmol/L — ABNORMAL LOW (ref 135–145)
Total Bilirubin: 0.7 mg/dL (ref 0.0–1.2)
Total Protein: 6.3 g/dL — ABNORMAL LOW (ref 6.5–8.1)

## 2024-02-03 NOTE — Progress Notes (Addendum)
 PCP: Lizabeth Riggs, PA-C  Subjective:   HPI: Patient is a 68 y.o. female here for low back, left hip and left knee pain.  H/o complete tear of PCL and lateral meniscus now s/p arthroscopic repair in 2023.  Completed 1 year of formal PT, stopped around June 2024.  Previously received steroid and gel injections without relief of symptoms.  Has had low back pain for "years".  Reports she previously saw a chiropractor for 20+ years which helped her symptoms and she has been continuing exercises since that time.  Notes that for the past couple of months her back is felt more stiff and she has been unable to pop it.  Feels like her back pain and knee pain are now contributing to left hip pain.  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  Frequently lifts and carries him.  Past Medical History:  Diagnosis Date   GERD (gastroesophageal reflux disease)    Hyperlipidemia    Lung nodule    left    Osteopenia    Osteoporosis    Squamous cell skin cancer    Right leg    Current Outpatient Medications on File Prior to Visit  Medication Sig Dispense Refill   Ascorbic Acid (VITA-C PO) Take by mouth daily.     COLLAGEN PO Take by mouth daily.     CREON  36000-114000 units CPEP capsule TAKE 2 CAPSULES BY MOUTH WITH MEALS, AND 1 CAPSULE WITH SNACKS. TAKE UP TO 10 CAPSULES PER DAY, AND TAKE WITH FIRST BITE OF FOOD. 300 capsule 5   cycloSPORINE (RESTASIS) 0.05 % ophthalmic emulsion Place 1 drop into both eyes 2 (two) times daily.     estradiol (ESTRACE) 0.1 MG/GM vaginal cream Place 1 Applicatorful 2 (two) times a week vaginally.      Multiple Vitamin (MULTIVITAMIN) tablet Take 1 tablet by mouth daily.     Multiple Vitamins-Minerals (ALGAE BASED CALCIUM PO) Take by mouth 2 (two) times daily.     nitroGLYCERIN  (NITRODUR - DOSED IN MG/24 HR) 0.2 mg/hr patch Apply 1/4th patch to affected achilles, change daily (Patient not taking: Reported on 02/11/2023) 30 patch 1   NON FORMULARY One  scoop of protein powder once daily. One scoop of Barley greens powder once daily.     polyethylene glycol powder (GLYCOLAX /MIRALAX ) 17 GM/SCOOP powder Take one capful twice daily. Hold for diarrhea. 527 g 5   Probiotic Product (PRO-BIOTIC BLEND PO) Take by mouth.     Strontium Chloride POWD by Does not apply route. capsules     valACYclovir (VALTREX) 1000 MG tablet Take 2,000 mg by mouth 2 (two) times daily as needed (fever blisters).      No current facility-administered medications on file prior to visit.    Past Surgical History:  Procedure Laterality Date   BIOPSY  11/02/2019   Procedure: BIOPSY;  Surgeon: Suzette Espy, MD;  [facility];  Service: Endoscopy;;  gastric   Biopsy right leg Right    skin   COLONOSCOPY N/A 09/17/2017   Dr. Riley Cheadle: normal colonoscopy.  Next colonoscopy in 10 years.   ESOPHAGOGASTRODUODENOSCOPY (EGD) WITH PROPOFOL  N/A 11/02/2019   Dr. Riley Cheadle: Gastric mucosa coated by thick tenacious bile-stained mucus.  Stomach diffusely injected with scattered focal hemorrhagic erosions but no frank ulcer.  Small hiatal hernia.  Gastric biopsy showed nonspecific reactive gastropathy, no H. pylori.  Duodenum appeared normal.   TONSILLECTOMY      Allergies  Allergen Reactions   Amoxicillin  Severe fatigue  Did it involve swelling of the face/tongue/throat, SOB, or low BP? No Did it involve sudden or severe rash/hives, skin peeling, or any reaction on the inside of your mouth or nose? No Did you need to seek medical attention at a hospital or doctor's office? No When did it last happen?      6 - 7 years If all above answers are "NO", may proceed with cephalosporin use.    Bactrim [Sulfamethoxazole-Trimethoprim] Hives   Keflex [Cephalexin] Hives   Hydrocodone Nausea Only    BP 130/64   Ht 5' 5.5" (1.664 m)   Wt 114 lb (51.7 kg)   BMI 18.68 kg/m       No data to display              No data to display              Objective:  Physical  Exam:  Gen: NAD, comfortable in exam room MSK: Spine: -Exaggerated thoracic kyphosis and lumbar lordosis -Mild scoliotic curve of thoracic spine with right hump -Nontender to palpation over lumbar spinous process -Significant lumbar paraspinal hypertonicity and gluteal hypertonicity noted particularly on the right -Normal strength lower extremities -Negative straight leg raise  Left hip: -No erythema, edema or ecchymosis noted -Nontender to palpation over left groin or greater trochanter -Full ROM without pain -Negative Thompson, FABER and FADIR - 5/5 hip flexion  Left knee: -No erythema or edema noted -TTP over medial and lateral joint lines -Negative varus, valgus, anterior, posterior drawer -Mild pain with McMurray's bilaterally -5/5 knee flexion and extension -ROM 0-110 degrees   Assessment & Plan:  1.  Low back pain: Chronic, likely secondary to arthritis and postural muscle hypertonicity.  Discussed with patient, she would like to try returning to chiropractic therapy and will consider formal PT if not improving.  Not interested in lumbar spine imaging for possible ESI at this time.  2.  Left knee pain: Chronic in the setting of prior PCL and lateral meniscus injury now s/p repair.  Unfortunately did not have relief with steroid or gel injections in the past.  Limited options, patient would like to continue with supportive care.  3.  Left hip pain: Likely referred from low back and left knee as above.  Otherwise normal hip exam.

## 2024-02-03 NOTE — Patient Instructions (Signed)
 Consider physical therapy if chiropractic care isn't helping - just let us  know and we would refer to the place up in [place] by you. Use heat 15 minutes at a time as needed for your back. Ok to take tylenol if pain is bad enough that you need something. Follow up with me in 1 month for reevaluation.

## 2024-02-04 LAB — IGG, IGA, IGM
IgA: 77 mg/dL — ABNORMAL LOW (ref 87–352)
IgG (Immunoglobin G), Serum: 605 mg/dL (ref 586–1602)
IgM (Immunoglobulin M), Srm: 92 mg/dL (ref 26–217)

## 2024-02-06 ENCOUNTER — Encounter: Payer: Self-pay | Admitting: Family Medicine

## 2024-02-07 ENCOUNTER — Other Ambulatory Visit: Payer: Self-pay

## 2024-02-07 DIAGNOSIS — G8929 Other chronic pain: Secondary | ICD-10-CM

## 2024-02-07 DIAGNOSIS — M25552 Pain in left hip: Secondary | ICD-10-CM

## 2024-02-07 NOTE — Telephone Encounter (Signed)
 Ok to go ahead with the referral and let her know.  Thanks!

## 2024-02-10 ENCOUNTER — Inpatient Hospital Stay: Payer: Medicare PPO | Admitting: Oncology

## 2024-02-10 VITALS — BP 138/66 | HR 95 | Temp 98.4°F | Resp 20 | Wt 114.6 lb

## 2024-02-10 DIAGNOSIS — D802 Selective deficiency of immunoglobulin A [IgA]: Secondary | ICD-10-CM | POA: Diagnosis not present

## 2024-02-10 DIAGNOSIS — Z806 Family history of leukemia: Secondary | ICD-10-CM | POA: Diagnosis not present

## 2024-02-10 DIAGNOSIS — Z808 Family history of malignant neoplasm of other organs or systems: Secondary | ICD-10-CM | POA: Diagnosis not present

## 2024-02-10 DIAGNOSIS — R768 Other specified abnormal immunological findings in serum: Secondary | ICD-10-CM

## 2024-02-10 DIAGNOSIS — Z800 Family history of malignant neoplasm of digestive organs: Secondary | ICD-10-CM | POA: Diagnosis not present

## 2024-02-10 DIAGNOSIS — R911 Solitary pulmonary nodule: Secondary | ICD-10-CM | POA: Diagnosis not present

## 2024-02-10 NOTE — Progress Notes (Signed)
 [facility] [address], [place] [address]   CLINIC:  Medical Oncology/Hematology  PCP:  Skillman, Katherine E, PA-C [address] [place] / [place] [address]  [phone]  REASON FOR VISIT:  Follow-up for decreased IgA levels   PRIOR THERAPY: none  CURRENT THERAPY: surveillance  INTERVAL HISTORY:  [name], a 68 y.o. female, returns for routine follow-up for her decreased IgA levels. [name] was last seen by [place], [place] on 02/11/2023.   In the interim, she reports left lower quadrant pain and bloating.  Palpable fullness in her right lower quadrant.  She is followed by [facility] gastroenterologist who has extensive workup without clear answers.  Reports she was seen by general surgeon for an inguinal hernia member the surgeons name.  Was a female here in states at that time, it was not large enough for surgery and it was not causing any issues.  She did have a colonoscopy and EGD in December 2024 which showed constipation or gastritis.  PCP recommended she eliminate gluten which did help some.  Reports she continues to have chronic fatigue appetite is great.  Her weight is up to 114.  Reports losing a bunch of weight in the time her mother passed away last Jan 22, 2024 due to stress.  He takes care of her son full-time who has CP.  She denies easy bruising or signs of bleeding. She denies any recurrent infections including sinus infections, pneumonias/bronchitis, UTIs. She denies fevers, chills, sweats, shortness of breath, chest pain or cough. She has no other complaints. Rest of the ROS is below.    REVIEW OF SYSTEMS:  Review of Systems  Constitutional:  Positive for fatigue. Negative for appetite change and unexpected weight change.  Gastrointestinal:  Positive for abdominal distention and constipation. Negative for abdominal pain, blood in stool, diarrhea, nausea and rectal pain.  Neurological:  Negative for dizziness and headaches.  Psychiatric/Behavioral:  The  patient is nervous/anxious.     PAST MEDICAL/SURGICAL HISTORY:  Past Medical History:  Diagnosis Date   GERD (gastroesophageal reflux disease)    Hyperlipidemia    Lung nodule    left    Osteopenia    Osteoporosis    Squamous cell skin cancer    Right leg   Past Surgical History:  Procedure Laterality Date   BIOPSY  11/02/2019   Procedure: BIOPSY;  Surgeon: Suzette Espy, MD;  [facility];  Service: Endoscopy;;  gastric   Biopsy right leg Right    skin   COLONOSCOPY N/A 09/17/2017   Dr. Riley Cheadle: normal colonoscopy.  Next colonoscopy in 10 years.   ESOPHAGOGASTRODUODENOSCOPY (EGD) WITH PROPOFOL  N/A 11/02/2019   Dr. Riley Cheadle: Gastric mucosa coated by thick tenacious bile-stained mucus.  Stomach diffusely injected with scattered focal hemorrhagic erosions but no frank ulcer.  Small hiatal hernia.  Gastric biopsy showed nonspecific reactive gastropathy, no H. pylori.  Duodenum appeared normal.   TONSILLECTOMY      SOCIAL HISTORY:  Social History   Socioeconomic History   Marital status: Married    Spouse name: Not on file   Number of children: 1   Years of education: Not on file   Highest education level: Not on file  Occupational History   Not on file  Tobacco Use   Smoking status: Never   Smokeless tobacco: Never  Vaping Use   Vaping status: Never Used  Substance and Sexual Activity   Alcohol use: No   Drug use: No  Sexual activity: Yes  Other Topics Concern   Not on file  Social History Narrative   Not on file   Social Drivers of Health   Financial Resource Strain: Not on file  Food Insecurity: Low Risk  (07/07/2023)   Received from [facility]   Hunger Vital Sign    Worried About Running Out of Food in the Last Year: Never true    Ran Out of Food in the Last Year: Never true  Transportation Needs: No Transportation Needs (07/07/2023)   Received from [facility]    In the past 12 months, has lack of reliable transportation kept  you from medical appointments, meetings, work or from getting things needed for daily living? : No  Physical Activity: Not on file  Stress: No Stress Concern Present (03/13/2021)   Received from [facility] - Occupational Stress Questionnaire    Feeling of Stress : Only a little  Social Connections: Not on file  Intimate Partner Violence: Not At Risk (04/12/2023)   Received from [facility]   Humiliation, Afraid, Rape, and Kick questionnaire    Fear of Current or Ex-Partner: No    Emotionally Abused: No    Physically Abused: No    Sexually Abused: No    FAMILY HISTORY:  Family History  Problem Relation Age of Onset   Osteoporosis Mother    Kidney disease Mother    Autoimmune disease Mother    Leukemia Mother    Parkinson's disease Father    Dementia Father    Kidney disease Father    Stroke Father    Angina Father    Skin cancer Father    Colon cancer Cousin    Parkinson's disease Maternal Grandfather    Heart attack Paternal Grandfather    Cerebral palsy Son     CURRENT MEDICATIONS:  Current Outpatient Medications  Medication Sig Dispense Refill   Ascorbic Acid (VITA-C PO) Take by mouth daily.     COLLAGEN PO Take by mouth daily.     cycloSPORINE (RESTASIS) 0.05 % ophthalmic emulsion Place 1 drop into both eyes 2 (two) times daily.     estradiol (ESTRACE) 0.1 MG/GM vaginal cream Place 1 Applicatorful 2 (two) times a week vaginally.      Multiple Vitamin (MULTIVITAMIN) tablet Take 1 tablet by mouth daily.     Multiple Vitamins-Minerals (ALGAE BASED CALCIUM PO) Take by mouth 2 (two) times daily.     Multiple Vitamins-Minerals (PRESERVISION AREDS PO) Take by mouth.     NON FORMULARY One scoop of protein powder once daily. One scoop of Barley greens powder once daily.     polyethylene glycol powder (GLYCOLAX /MIRALAX ) 17 GM/SCOOP powder Take one capful twice daily. Hold for diarrhea. 527 g 5   Probiotic  Product (PRO-BIOTIC BLEND PO) Take by mouth.     psyllium (METAMUCIL) 58.6 % packet Take 1 packet by mouth daily.     Strontium Chloride POWD by Does not apply route. capsules     valACYclovir (VALTREX) 1000 MG tablet Take 2,000 mg by mouth 2 (two) times daily as needed (fever blisters).      No current facility-administered medications for this visit.    ALLERGIES:  Allergies  Allergen Reactions   Amoxicillin     Severe fatigue  Did it involve swelling of the face/tongue/throat, SOB, or low BP? No Did it involve sudden or severe rash/hives, skin peeling, or any reaction on  the inside of your mouth or nose? No Did you need to seek medical attention at a hospital or doctor's office? No When did it last happen?      6 - 7 years If all above answers are "NO", may proceed with cephalosporin use.    Bactrim [Sulfamethoxazole-Trimethoprim] Hives   Keflex [Cephalexin] Hives   Hydrocodone Nausea Only    PHYSICAL EXAM:  Performance status (ECOG): 1 - Symptomatic but completely ambulatory  Vitals:   02/10/24 1312  BP: 138/66  Pulse: 95  Resp: 20  Temp: 98.4 F (36.9 C)  SpO2: 100%    Wt Readings from Last 3 Encounters:  02/10/24 114 lb 10.2 oz (52 kg)  02/03/24 114 lb (51.7 kg)  08/05/23 108 lb (49 kg)   Physical Exam Constitutional:      Appearance: Normal appearance.  Cardiovascular:     Rate and Rhythm: Normal rate and regular rhythm.  Pulmonary:     Effort: Pulmonary effort is normal.     Breath sounds: Normal breath sounds.  Abdominal:     General: Bowel sounds are normal.     Palpations: Abdomen is soft.  Musculoskeletal:        General: No swelling. Normal range of motion.  Neurological:     Mental Status: She is alert and oriented to person, place, and time. Mental status is at baseline.     LABORATORY DATA:  I have reviewed the labs as listed.     Latest Ref Rng & Units 02/03/2024    8:55 AM 01/27/2023    1:23 PM 01/14/2022    9:25 AM  CBC  WBC 4.0 - 10.5  K/uL 5.4  5.5  4.7   Hemoglobin 12.0 - 15.0 g/dL 95.6  21.3  08.6   Hematocrit 36.0 - 46.0 % 40.1  39.4  39.1   Platelets 150 - 400 K/uL 269  280  288       Latest Ref Rng & Units 02/03/2024    8:55 AM 09/14/2022   11:54 AM 07/21/2021    8:17 AM  CMP  Glucose 70 - 99 mg/dL 578  96    BUN 8 - 23 mg/dL 12  14    Creatinine 4.69 - 1.00 mg/dL 6.29  5.28  4.13   Sodium 135 - 145 mmol/L 133  139    Potassium 3.5 - 5.1 mmol/L 4.0  4.3    Chloride 98 - 111 mmol/L 96  99    CO2 22 - 32 mmol/L 28  26    Calcium 8.9 - 10.3 mg/dL 9.0  9.5    Total Protein 6.5 - 8.1 g/dL 6.3  6.4    Total Bilirubin 0.0 - 1.2 mg/dL 0.7  0.3    Alkaline Phos 38 - 126 U/L 76  84    AST 15 - 41 U/L 23  17    ALT 0 - 44 U/L 16  16        Component Value Date/Time   RBC 4.38 02/03/2024 0855   MCV 91.6 02/03/2024 0855   MCV 92 10/03/2019 1345   MCH 31.3 02/03/2024 0855   MCHC 34.2 02/03/2024 0855   RDW 13.2 02/03/2024 0855   RDW 12.4 10/03/2019 1345   LYMPHSABS 2.6 02/03/2024 0855   LYMPHSABS 1.6 10/03/2019 1345   MONOABS 0.4 02/03/2024 0855   EOSABS 0.1 02/03/2024 0855   EOSABS 0.0 10/03/2019 1345   BASOSABS 0.1 02/03/2024 0855   BASOSABS 0.0 10/03/2019 1345    DIAGNOSTIC  IMAGING:  I have independently reviewed the scans and discussed with the patient. No results found.   ASSESSMENT:  1.  Mild IgA deficiency: -She was evaluated for low IgA levels of 72. -Repeat immunoglobulin levels show IgA of 76.  IgG was normal.  She does not have any recurrent infections including sinopulmonary infections. -No active intervention necessary.   2.  Left upper lobe pulmonary nodule: -CT chest on 07/16/2020 shows stable 8 x 5 mm subpleural left upper lobe pulmonary nodule with no mediastinal or hilar adenopathy.  Stable biapical pleural and parenchymal scarring type changes. -She is non-smoker. - CT chest without contrast on 01/20/2021: Benign pleural parenchymal scarring in the posterior left upper lobe, benign.   No suspicious lung nodules.  No further imaging required.   PLAN:  1.  Mild IgA deficiency: - She does not report any recurrent infections in the last 1 year. - No fevers, night sweats or significant weight loss in the last 1 year. - Reviewed labs from 02/03/2024 that show  IgA level is 77 and IgG level is normal at 605.  IgM was normal. - CBC was normal. -Reports that her PCP also collects her IgA levels annually.  We discussed given stability of her labs she may be discharged from our clinic and follow-up with her PCP only.  She was agreeable.  Disposition: No F/u needed.      Orders placed this encounter:  No orders of the defined types were placed in this encounter.   Patient expressed understanding of the plan provided.   I spent 20 minutes dedicated to the care of this patient (face-to-face and non-face-to-face) on the date of the encounter to include what is described in the assessment and plan.  Charlton Cooler, NP 02/10/2024 2:48 PM

## 2024-03-02 ENCOUNTER — Ambulatory Visit: Admitting: Family Medicine

## 2024-03-02 VITALS — BP 132/69 | Ht 65.5 in | Wt 114.0 lb

## 2024-03-02 DIAGNOSIS — M25519 Pain in unspecified shoulder: Secondary | ICD-10-CM

## 2024-03-02 DIAGNOSIS — M25552 Pain in left hip: Secondary | ICD-10-CM | POA: Diagnosis not present

## 2024-03-02 DIAGNOSIS — M545 Low back pain, unspecified: Secondary | ICD-10-CM

## 2024-03-02 DIAGNOSIS — G8929 Other chronic pain: Secondary | ICD-10-CM | POA: Diagnosis not present

## 2024-03-02 NOTE — Patient Instructions (Addendum)
 You have mild rotator cuff impingement, tendinopathy. Do home exercises daily as directed. Consider topical medications like voltaren gel, biofreeze.  Peppermint oil is a consideration. Consider formal physical therapy.  For your calf and peroneal tendon do home exercises for these as well once daily. Icing 15 minutes at a time as needed. Topical medication as above.  We will place an order for home health pt/ot for you to evaluate for proper lifting technique, transfers, ergonomics.  Show them what you have to help with so they can make sure you're doing it correctly to minimize risk of injury.  [facility]: [phone] [name] is our rep for this company. They will contact you to set up an initial appt.

## 2024-03-02 NOTE — Progress Notes (Unsigned)
 PCP: Lizabeth Riggs, PA-C  Subjective:   HPI: Patient is a 68 y.o. female here for left shoulder and right ankle pain.  She states that her shoulder pain is mainly anterior and most noticeable with overhead exercises such as removing her shirt. She describes the pain as mostly soreness with some sharp pain if she irritates the area. She has not taken any medications for the pain nor used any topical medications. 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 She does have an overhead lift to assist with transfers, but she does manually have to roll him for changing his clothing and cleaning. She has not noticed any swelling, numbness, tingling, or radiation of the pain down her arm.   Additionally, she states she has been having right ankle pain for around one month. She states she has significant morning stiffness and overall has pain with calf exercises. She has been doing standing calf raises to try and help but they have been ineffective. She does not have a history of trauma to the area and is not having any numbness, tingling, burning sensations.   Past Medical History:  Diagnosis Date   GERD (gastroesophageal reflux disease)    Hyperlipidemia    Lung nodule    left    Osteopenia    Osteoporosis    Squamous cell skin cancer    Right leg    Current Outpatient Medications on File Prior to Visit  Medication Sig Dispense Refill   Ascorbic Acid (VITA-C PO) Take by mouth daily.     COLLAGEN PO Take by mouth daily.     cycloSPORINE (RESTASIS) 0.05 % ophthalmic emulsion Place 1 drop into both eyes 2 (two) times daily.     estradiol (ESTRACE) 0.1 MG/GM vaginal cream Place 1 Applicatorful 2 (two) times a week vaginally.      Multiple Vitamin (MULTIVITAMIN) tablet Take 1 tablet by mouth daily.     Multiple Vitamins-Minerals (ALGAE BASED CALCIUM PO) Take by mouth 2 (two) times  daily.     Multiple Vitamins-Minerals (PRESERVISION AREDS PO) Take by mouth.     NON FORMULARY One scoop of protein powder once daily. One scoop of Barley greens powder once daily.     polyethylene glycol powder (GLYCOLAX /MIRALAX ) 17 GM/SCOOP powder Take one capful twice daily. Hold for diarrhea. 527 g 5   Probiotic Product (PRO-BIOTIC BLEND PO) Take by mouth.     psyllium (METAMUCIL) 58.6 % packet Take 1 packet by mouth daily.     Strontium Chloride POWD by Does not apply route. capsules     valACYclovir (VALTREX) 1000 MG tablet Take 2,000 mg by mouth 2 (two) times daily as needed (fever blisters).      No current facility-administered medications on file prior to visit.    Past Surgical History:  Procedure Laterality Date   BIOPSY  11/02/2019   Procedure: BIOPSY;  Surgeon: Suzette Espy, MD;  [facility];  Service: Endoscopy;;  gastric   Biopsy right leg Right    skin   COLONOSCOPY N/A 09/17/2017   Dr. Riley Cheadle: normal colonoscopy.  Next colonoscopy in 10 years.   ESOPHAGOGASTRODUODENOSCOPY (EGD) WITH PROPOFOL  N/A 11/02/2019   Dr. Riley Cheadle: Gastric mucosa coated by thick tenacious bile-stained mucus.  Stomach diffusely injected with scattered focal hemorrhagic erosions but no frank ulcer.  Small hiatal hernia.  Gastric biopsy showed nonspecific reactive gastropathy, no  H. pylori.  Duodenum appeared normal.   TONSILLECTOMY      Allergies  Allergen Reactions   Amoxicillin     Severe fatigue  Did it involve swelling of the face/tongue/throat, SOB, or low BP? No Did it involve sudden or severe rash/hives, skin peeling, or any reaction on the inside of your mouth or nose? No Did you need to seek medical attention at a hospital or doctor's office? No When did it last happen?      6 - 7 years If all above answers are "NO", may proceed with cephalosporin use.    Bactrim [Sulfamethoxazole-Trimethoprim] Hives   Keflex [Cephalexin] Hives   Hydrocodone Nausea Only    BP 132/69    Ht 5' 5.5" (1.664 m)   Wt 114 lb (51.7 kg)   BMI 18.68 kg/m       No data to display              No data to display              Objective:  Physical Exam:  Gen: NAD, comfortable in exam room  MSK: Shoulder Inspection: No visible deformities, bruising, swelling Palpation: No tenderness along shoulder girdle, bicipital groove, AC joint, scapula ROM: extension to 160 deg, ER to 45 deg with pain, IR normal Strength: 5/5 to abduction, extension, ER/IR Special Tests: Neers positive, Hawkins negative  Ankle Inspection: No bruising, swelling. Palpation: Tender to myotendinous junction of gastroc. No tenderness along Achilles, calcaneus.  ROM: Plantar flexion 50 deg, dorsiflexion 20 deg, inversion 30 deg, eversion 10 deg Strength: Plantar flexion 5/5, dorsiflexion 5/5, inversion  5/5, eversion  5/5 Gait:  Special Tests: Calcaneal squeeze negative, Thompson negative   Assessment & Plan:  Patient is a 68 y.o. female here for left shoulder and right ankle pain.  1. Subacromial Impingement  Pain with overhead movements and positive Neer's indicate this is likely subacromial impingement versus a rotator cuff tear or biceps tendinopathy. Concerned that suboptimal ergonomics when caring for her son may be contributing to her symptoms. - Home shoulder and scapular exercises - Home health PT/OT for evaluation of proper lifting and transfer ergonomics - Can consider topical medications, peppermint oil on area - Will consider formal home PT should pain persist  2. Gastrocnemius Tendinopathy Location of pain along the myotendinous junction with an otherwise normal physical exam makes this most likely. Low suspicion for achilles tendinopathy given minimal tenderness in area but can consider should pain persist. - Home exercises daily - Ice, heat, topical medication as needed   Elnoria Hails, MS4 [facility]

## 2024-03-07 ENCOUNTER — Encounter: Payer: Self-pay | Admitting: Family Medicine

## 2024-03-15 ENCOUNTER — Encounter (HOSPITAL_COMMUNITY): Payer: Self-pay

## 2024-03-15 ENCOUNTER — Other Ambulatory Visit: Payer: Self-pay

## 2024-03-15 ENCOUNTER — Ambulatory Visit (HOSPITAL_COMMUNITY): Attending: Family Medicine

## 2024-03-15 DIAGNOSIS — Z8739 Personal history of other diseases of the musculoskeletal system and connective tissue: Secondary | ICD-10-CM | POA: Diagnosis not present

## 2024-03-15 DIAGNOSIS — G8929 Other chronic pain: Secondary | ICD-10-CM | POA: Diagnosis not present

## 2024-03-15 DIAGNOSIS — M545 Low back pain, unspecified: Secondary | ICD-10-CM | POA: Diagnosis not present

## 2024-03-15 DIAGNOSIS — R29898 Other symptoms and signs involving the musculoskeletal system: Secondary | ICD-10-CM | POA: Insufficient documentation

## 2024-03-15 DIAGNOSIS — M25552 Pain in left hip: Secondary | ICD-10-CM | POA: Diagnosis not present

## 2024-03-15 DIAGNOSIS — Z7409 Other reduced mobility: Secondary | ICD-10-CM | POA: Insufficient documentation

## 2024-03-15 NOTE — Therapy (Signed)
 OUTPATIENT PHYSICAL THERAPY THORACOLUMBAR EVALUATION   Patient Name: ATHIRA JANOWICZ MRN: [id number] DOB:1956/03/19, 68 y.o., female Today's Date: 03/15/2024  END OF SESSION:  PT End of Session - 03/15/24 1430     Visit Number 1    Date for PT Re-Evaluation 04/26/24    Authorization Type [employer] CHO[employer] PPO    Authorization Time Period seeking auth    Authorization - Visit Number 0    Progress Note Due on Visit 10    PT Start Time 1300    PT Stop Time 1345    PT Time Calculation (min) 45 min    Activity Tolerance Patient tolerated treatment well;Patient limited by pain    Behavior During Therapy WFL for tasks assessed/performed          Past Medical History:  Diagnosis Date   GERD (gastroesophageal reflux disease)    Hyperlipidemia    Lung nodule    left    Osteopenia    Osteoporosis    Squamous cell skin cancer    Right leg   Past Surgical History:  Procedure Laterality Date   BIOPSY  11/02/2019   Procedure: BIOPSY;  Surgeon: Suzette Espy, MD;  Location: [facility];  Service: Endoscopy;;  gastric   Biopsy right leg Right    skin   COLONOSCOPY N/A 09/17/2017   Dr. Riley Cheadle: normal colonoscopy.  Next colonoscopy in 10 years.   ESOPHAGOGASTRODUODENOSCOPY (EGD) WITH PROPOFOL  N/A 11/02/2019   Dr. Riley Cheadle: Gastric mucosa coated by thick tenacious bile-stained mucus.  Stomach diffusely injected with scattered focal hemorrhagic erosions but no frank ulcer.  Small hiatal hernia.  Gastric biopsy showed nonspecific reactive gastropathy, no H. pylori.  Duodenum appeared normal.   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 54/23/2021   Gastritis and gastroduodenitis 12/06/2019   Left sided abdominal pain 09/20/2019   Burping  09/20/2019   Loss of weight 09/20/2019   Change in bowel function 08/12/2017   Constipation 08/12/2017   Bloating 08/12/2017    PCP: Lizabeth Riggs, PA-C   REFERRING PROVIDER: Salina Craver, MD  REFERRING DIAG: M54.50,G89.29 (ICD-10-CM) - Chronic bilateral low back pain without sciatica M25.552 (ICD-10-CM) - Left hip pain  Rationale for Evaluation and Treatment: Rehabilitation  THERAPY DIAG:  Weakness of both lower extremities - Plan: PT plan of care cert/re-cert  Impaired functional mobility, balance, gait, and endurance - Plan: PT plan of care cert/re-cert  History of low back pain - Plan: PT plan of care cert/re-cert  ONSET DATE: Back, early 90s; hip, February 2024  SUBJECTIVE:  SUBJECTIVE STATEMENT: Pt describes extensive medical history low back pain, . 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 Pt states she is starting to look for help for caring for son as he is high maintenance. Pt report history of shoulder pain, left and right but left most recent. Pt reports her priorities would be her left knee and hip and low back. Pt states she thinks it from overuse from caring fro adult son. Pt describes shoulder pain has kept her from performing normal UE exercise. Pt states she really wants to take care of herself as she want to remain active and healthy as long has possible.  PERTINENT HISTORY:  Chiropractor/acupuncture, for low back pain Has had PT for low back occasionally over the years -Osteopenia -Left heel pain -2 years ago, left knee torn meniscus and ACL insidious, pain ever since -History of cancer   PAIN:  Are you having pain? Yes: NPRS scale: 7-8/10 Pain location: left knee, low back and left hip Pain description: aching, dull Aggravating  factors: standing and positional, caring for son Relieving factors: resting, pain meds  PRECAUTIONS: None  RED FLAGS: Hx of cancer   WEIGHT BEARING RESTRICTIONS: No  FALLS:  Has patient fallen in last 6 months? 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  DIAGNOSTIC FINDINGS:  CLINICAL DATA:  Epigastric pain. Bloating. Diarrhea. Pancreatic insufficiency. Weight loss.   EXAM: CT ABDOMEN AND PELVIS WITH CONTRAST   TECHNIQUE: Multidetector CT imaging of the abdomen and pelvis was performed using the standard protocol following bolus administration of intravenous contrast.   RADIATION DOSE REDUCTION: This exam was performed according to the departmental dose-optimization program which includes automated exposure control, adjustment of the mA and/or kV according to patient size and/or use of iterative reconstruction technique.   CONTRAST:  OMNIPAQUE  IOHEXOL  300 MG/ML  SOLN   COMPARISON:  07/23/2021   FINDINGS: Lower Chest: No acute findings.   Hepatobiliary: No hepatic masses identified. Gallbladder is unremarkable. No evidence of biliary ductal dilatation.   Pancreas: No mass or inflammatory changes. No signs of chronic pancreatitis or parenchymal atrophy.   Spleen: Within normal limits in size and appearance.   Adrenals/Urinary Tract: No suspicious masses identified. No evidence of ureteral calculi or hydronephrosis.   Stomach/Bowel: No evidence of obstruction, inflammatory process or abnormal fluid collections. Normal appendix visualized. Moderate-to-large amount of stool again seen throughout colon.   Vascular/Lymphatic: No pathologically enlarged lymph nodes. No acute vascular findings. Aortic atherosclerotic  calcification incidentally noted.   Reproductive:  No mass or other significant abnormality.   Other:  None.   Musculoskeletal:  No suspicious bone lesions identified.   IMPRESSION: No acute findings within the abdomen or pelvis.   Moderate-to-large stool burden noted; recommend clinical correlation for possible constipation.   Aortic Atherosclerosis (ICD10-I70.0).  PATIENT SURVEYS:  LEFS : TBA   COGNITION: Overall cognitive status: Within functional limits for tasks assessed     SENSATION: WFL   POSTURE: rounded shoulders, forward head, and flexed trunk   PALPATION: Pt tender to palpation of lateral left hip, site of greater trochanter.  LUMBAR ROM:   AROM eval  Flexion 75  Extension 10  Right lateral flexion Fingers to knees  Left lateral flexion Fingers to knees  Right rotation Full riange no pakn  Left rotation Full range no pian   (Blank rows = not tested)  LOWER EXTREMITY ROM:     Active  Right eval Left eval  Hip flexion    Hip extension    Hip abduction    Hip adduction    Hip internal rotation    Hip external rotation    Knee flexion    Knee extension    Ankle dorsiflexion    Ankle plantarflexion    Ankle inversion    Ankle eversion     (Blank rows = not tested)  LOWER EXTREMITY MMT:    MMT Right eval Left eval  Hip flexion 4 3+, pulling pain  Hip extension 4+ 4-  Hip abduction 4 4  Hip adduction 4 4  Hip internal rotation    Hip external rotation    Knee flexion 4 4-  Knee extension 4 4-  Ankle dorsiflexion 4 4  Ankle plantarflexion    Ankle inversion    Ankle eversion     (Blank rows = not tested)  HIP TESTS:  Positive Faber test on LLE  FUNCTIONAL TESTS:  5 times sit to stand: 17.07, pain in left hip 2 minute walk test: TBA  GAIT: Distance walked: 75 feet to and from treatment area Assistive device utilized: None Level of assistance: Complete Independence Comments: pt demonstrates decreased gait speed and  decreased stride length bilaterally, no visible asymmetry noted on this date.  TREATMENT DATE:  03/15/2024  Evaluation: -ROM measured, Strength assessed, HEP prescribed, pt educated on prognosis, findings, and importance of HEP compliance if given.   PATIENT EDUCATION:  Education details: Pt was educated on findings of PT evaluation, prognosis, frequency of therapy visits and rationale, attendance policy, and HEP if given.   Person educated: Patient Education method: Explanation, Verbal cues, and Handouts Education comprehension: verbalized understanding, verbal cues required, tactile cues required, and needs further education  HOME EXERCISE PROGRAM: Access Code: [email or web address] URL: [email or web address] Date: 03/15/2024 Prepared by: Armond Bertin  Exercises - Sit to Stand with Arms Crossed  - 1 x daily - 7 x weekly - 3 sets - 10 reps - Supine Bridge  - 1 x daily - 7 x weekly - 3 sets - 10 reps - Standing Hip Abduction with Counter Support  - 1 x daily - 7 x weekly - 3 sets - 10 reps  ASSESSMENT:  CLINICAL IMPRESSION: Patient is a 68 y.o. female who was seen today for physical therapy evaluation and treatment for M54.50,G89.29 (ICD-10-CM) - Chronic bilateral low back pain without sciatica M25.552 (ICD-10-CM) - Left hip pain.   Patient demonstrates low back pain and left hip pain, decreased bilateral LE strength, abnormal gait pattern, and impaired functional mobility. Patient also demonstrates difficulty with ambulation during today's session with decreased stride length and velocity noted. Patient also demonstrates pain with ER of left hip on lateral side during FABER special test, possible hip pathology. Patient requires education on role of PT, importance of HEP and prognosis. Patient would benefit from skilled physical therapy for decreased low back and LLE pain, increased endurance with ambulation, increased BLE strength, and balance for improved gait quality, return to  higher level of function with ADLs, and progress towards therapy goals.   OBJECTIVE IMPAIRMENTS: Abnormal gait, decreased activity tolerance, decreased balance, decreased endurance, decreased mobility, difficulty walking, decreased ROM, decreased strength, improper body mechanics, postural dysfunction, and pain.   ACTIVITY LIMITATIONS: carrying, lifting, bending, standing, squatting, stairs, and caring for  others  PARTICIPATION LIMITATIONS: meal prep, cleaning, laundry, community activity, occupation, and yard work  PERSONAL FACTORS: Age, Fitness, Past/current experiences, Time since onset of injury/illness/exacerbation, and 1 comorbidity: hx of skin cancer are also affecting patient's functional outcome.   REHAB POTENTIAL: Fair pt has pain throughout body  CLINICAL DECISION MAKING: Evolving/moderate complexity  EVALUATION COMPLEXITY: Moderate   GOALS: Goals reviewed with patient? No  SHORT TERM GOALS: Target date: 04/05/24  Pt will be independent with HEP in order to demonstrate participation in Physical Therapy POC.  Baseline: Goal status: INITIAL  2.  Pt will report 5/10 pain with mobility in order to demonstrate improved pain with ADLs.  Baseline:  Goal status: INITIAL  LONG TERM GOALS: Target date: 04/26/24  Pt will improve 5TSTS by 2.3 seconds in order to demonstrate improved functional strength to return to desired activities.  Baseline: see objective.  Goal status: INITIAL  2.  Pt will improve 2 MWT by 40 feet in order to demonstrate improved functional ambulatory capacity in community setting.  Baseline: see objective.  Goal status: INITIAL  3.  Pt will improve LEFS score by 9 points in order to demonstrate decreased difficulty with ADL and improved quality of life.  Baseline: see objective.  Goal status: INITIAL  4.  Pt will report 3/10 pain with mobility in order to demonstrate reduced pain with ADLs lasting greater than 30 minutes.  Baseline: see objective.   Goal status: INITIAL   PLAN:  PT FREQUENCY: 1-2x/week  PT DURATION: 6 weeks  PLANNED INTERVENTIONS: 97110-Therapeutic exercises, 97530- Therapeutic activity, 97112- Neuromuscular re-education, 97535- Self Care, [id number]- Manual therapy, [phone]- Gait training, Patient/Family education, Balance training, Stair training, Joint mobilization, Spinal mobilization, DME instructions, Cryotherapy, and Moist heat.  PLAN FOR NEXT SESSION: LEFS, , review HEP and goals   Armond Bertin, PT, DPT [facility] Office: [phone] 2:35 PM, 03/15/24  [place] Request  Referring diagnosis code (ICD 10)? M54.50,G89.29 (ICD-10-CM) - Chronic bilateral low back pain without sciatica M25.552 (ICD-10-CM) - Left hip pain Treatment diagnosis codes (ICD 10)? (if different than referring diagnosis) R29.898 ;  Z74.09 ; Z87.39  What was this (referring dx) caused by? []  Surgery []  Fall []  Ongoing issue []  Arthritis []  Other: ____________  Laterality: []  Rt []  Lt []  Both  Deficits: [x]  Pain []  Stiffness [x]  Weakness []  Edema [x]  Balance Deficits []  Coordination [x]  Gait Disturbance []  ROM []  Other   Functional Tool Score: 5 times sit to stand: 17.07, pain in left hip   CPT codes: See Planned Interventions listed in the Plan section of the Evaluation.

## 2024-03-23 ENCOUNTER — Ambulatory Visit (HOSPITAL_COMMUNITY): Admitting: Physical Therapy

## 2024-03-23 DIAGNOSIS — M545 Low back pain, unspecified: Secondary | ICD-10-CM | POA: Diagnosis not present

## 2024-03-23 DIAGNOSIS — Z8739 Personal history of other diseases of the musculoskeletal system and connective tissue: Secondary | ICD-10-CM | POA: Diagnosis not present

## 2024-03-23 DIAGNOSIS — G8929 Other chronic pain: Secondary | ICD-10-CM | POA: Diagnosis not present

## 2024-03-23 DIAGNOSIS — Z7409 Other reduced mobility: Secondary | ICD-10-CM | POA: Diagnosis not present

## 2024-03-23 DIAGNOSIS — R29898 Other symptoms and signs involving the musculoskeletal system: Secondary | ICD-10-CM | POA: Diagnosis not present

## 2024-03-23 DIAGNOSIS — M25552 Pain in left hip: Secondary | ICD-10-CM | POA: Diagnosis not present

## 2024-03-23 NOTE — Therapy (Signed)
 OUTPATIENT PHYSICAL THERAPY THORACOLUMBAR TREATMENT   Patient Name: [name] MRN: [id number] DOB:26-Jun-1956, 68 y.o., female Today's Date: 03/23/2024  END OF SESSION:  PT End of Session - 03/23/24 1624     Visit Number 2    Number of Visits 8    Date for PT Re-Evaluation 04/26/24    Authorization Type [employer]IC[employer] CHOICE PPO    Authorization Time Period 8 visits approved 6/18-8/1    Authorization - Number of Visits 2    Progress Note Due on Visit 8    PT Start Time 1435    PT Stop Time 1517    PT Time Calculation (min) 42 min    Activity Tolerance Patient tolerated treatment well;Patient limited by pain    Behavior During Therapy [facility] for tasks assessed/performed           Past Medical History:  Diagnosis Date   GERD (gastroesophageal reflux disease)    Hyperlipidemia    Lung nodule    left    Osteopenia    Osteoporosis    Squamous cell skin cancer    Right leg   Past Surgical History:  Procedure Laterality Date   BIOPSY  11/02/2019   Procedure: BIOPSY;  Surgeon: Shaaron Lamar HERO, MD;  Location: [facility];  Service: Endoscopy;;  gastric   Biopsy right leg Right    skin   COLONOSCOPY N/A 09/17/2017   Dr. Shaaron: normal colonoscopy.  Next colonoscopy in 10 years.   ESOPHAGOGASTRODUODENOSCOPY (EGD) WITH PROPOFOL  N/A 11/02/2019   Dr. Shaaron: Gastric mucosa coated by thick tenacious bile-stained mucus.  Stomach diffusely injected with scattered focal hemorrhagic erosions but no frank ulcer.  Small hiatal hernia.  Gastric biopsy showed nonspecific reactive gastropathy, no H. pylori.  Duodenum appeared normal.   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 54/23/2021   Gastritis and gastroduodenitis 12/06/2019   Left sided  abdominal pain 09/20/2019   Burping 09/20/2019   Loss of weight 09/20/2019   Change in bowel function 08/12/2017   Constipation 08/12/2017   Bloating 08/12/2017    PCP: [name] BRAVO, PA-C   REFERRING PROVIDER: [name], MD  REFERRING DIAG: M54.50,G89.29 (ICD-10-CM) - Chronic bilateral low back pain without sciatica M25.552 (ICD-10-CM) - Left hip pain  Rationale for Evaluation and Treatment: Rehabilitation  THERAPY DIAG:  Weakness of both lower extremities  Impaired functional mobility, balance, gait, and endurance  History of low back pain  ONSET DATE: Back, early 90s; hip, February 2024  SUBJECTIVE:  SUBJECTIVE STATEMENT: 03/23/24: Pt reports she made a list of activities she noticed increased her pain; states currently she is only having soreness, no pain.  Pt with various questions/concerns throughout session today. Pt wondering if we received order for Lt shoulder.  Evaluation: Pt describes extensive medical history low back pain, . 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 Pt states she is starting to look for help for caring for son as he is high maintenance. Pt report history of shoulder pain, left and right but left most recent. Pt reports her priorities would be her left knee and hip and low back. Pt states she thinks it from overuse from caring fro adult son. Pt describes shoulder pain has kept her from performing normal UE exercise. Pt states she really wants to take care of herself as she want to remain active and healthy as long has possible.  PERTINENT HISTORY:  Chiropractor/acupuncture, for low back pain Has had PT for low back occasionally over the years -Osteopenia -Left heel pain -2 years ago, left knee torn meniscus and ACL insidious,  pain ever since -History of cancer   PAIN:  Are you having pain? Yes: NPRS scale: 7-8/10 Pain location: left knee, low back and left hip Pain description: aching, dull Aggravating factors: standing and positional, caring for son Relieving factors: resting, pain meds  PRECAUTIONS: None  RED FLAGS: Hx of cancer   WEIGHT BEARING RESTRICTIONS: No  FALLS:  Has patient fallen in last 6 months? 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  DIAGNOSTIC FINDINGS:  CLINICAL DATA:  Epigastric pain. Bloating. Diarrhea. Pancreatic insufficiency. Weight loss.   EXAM: CT ABDOMEN AND PELVIS WITH CONTRAST   TECHNIQUE: Multidetector CT imaging of the abdomen and pelvis was performed using the standard protocol following bolus administration of intravenous contrast.   RADIATION DOSE REDUCTION: This exam was performed according to the departmental dose-optimization program which includes automated exposure control, adjustment of the mA and/or kV according to patient size and/or use of iterative reconstruction technique.   CONTRAST:  OMNIPAQUE  IOHEXOL  300 MG/ML  SOLN   COMPARISON:  07/23/2021   FINDINGS: Lower Chest: No acute findings.   Hepatobiliary: No hepatic masses identified. Gallbladder is unremarkable. No evidence of biliary ductal dilatation.   Pancreas: No mass or inflammatory changes. No signs of chronic pancreatitis or parenchymal atrophy.   Spleen: Within normal limits in size and appearance.   Adrenals/Urinary Tract: No suspicious masses identified. No evidence of ureteral calculi or hydronephrosis.   Stomach/Bowel: No evidence of obstruction, inflammatory process or abnormal fluid collections. Normal appendix  visualized. Moderate-to-large amount of stool again seen throughout colon.   Vascular/Lymphatic: No pathologically enlarged lymph nodes. No acute vascular findings. Aortic atherosclerotic calcification incidentally noted.   Reproductive:  No mass or other significant abnormality.   Other:  None.   Musculoskeletal:  No suspicious bone lesions identified.   IMPRESSION: No acute findings within the abdomen or pelvis.   Moderate-to-large stool burden noted; recommend clinical correlation for possible constipation.   Aortic Atherosclerosis (ICD10-I70.0).  PATIENT SURVEYS:  LEFS : TBA    03/23/24:  58/80=47.5%  COGNITION: Overall cognitive status: Within functional limits for tasks assessed     SENSATION: WFL   POSTURE: rounded shoulders,  forward head, and flexed trunk   PALPATION: Pt tender to palpation of lateral left hip, site of greater trochanter.  LUMBAR ROM:   AROM eval  Flexion 75  Extension 10  Right lateral flexion Fingers to knees  Left lateral flexion Fingers to knees  Right rotation Full riange no pakn  Left rotation Full range no pian   (Blank rows = not tested)  LOWER EXTREMITY ROM:     Active  Right eval Left eval  Hip flexion    Hip extension    Hip abduction    Hip adduction    Hip internal rotation    Hip external rotation    Knee flexion    Knee extension    Ankle dorsiflexion    Ankle plantarflexion    Ankle inversion    Ankle eversion     (Blank rows = not tested)  LOWER EXTREMITY MMT:    MMT Right eval Left eval  Hip flexion 4 3+, pulling pain  Hip extension 4+ 4-  Hip abduction 4 4  Hip adduction 4 4  Hip internal rotation    Hip external rotation    Knee flexion 4 4-  Knee extension 4 4-  Ankle dorsiflexion 4 4  Ankle plantarflexion    Ankle inversion    Ankle eversion     (Blank rows = not tested)  HIP TESTS:  Positive Faber test on LLE  FUNCTIONAL TESTS:  5 times sit to stand: 17.07, pain in left hip 2  minute walk test: TBA  03/23/24: 450 feet no AD  GAIT: Distance walked: 75 feet to and from treatment area Assistive device utilized: None Level of assistance: Complete Independence Comments: pt demonstrates decreased gait speed and decreased stride length bilaterally, no visible asymmetry noted on this date.  TREATMENT DATE:  03/23/24 Goal review :  450 feet no AD LEFS: 58/80=47.5% Discussion of POC; symptoms and concerns Standing:  doorway stretch for chest Supine:  PVC shoulder flexion (for HEP)  03/15/2024  Evaluation: -ROM measured, Strength assessed, HEP prescribed, pt educated on prognosis, findings, and importance of HEP compliance if given.   PATIENT EDUCATION:  Education details: Pt was educated on findings of PT evaluation, prognosis, frequency of therapy visits and rationale, attendance policy, and HEP if given.   Person educated: Patient Education method: Explanation, Verbal cues, and Handouts Education comprehension: verbalized understanding, verbal cues required, tactile cues required, and needs further education  HOME EXERCISE PROGRAM: Access Code: [email or web address] URL: [email or web address] Date: 03/15/2024 Prepared by: Lang Ada  Exercises - Sit to Stand with Arms Crossed  - 1 x daily - 7 x weekly - 3 sets - 10 reps - Supine Bridge  - 1 x daily - 7 x weekly - 3 sets - 10 reps - Standing Hip Abduction with Counter Support  - 1 x daily - 7 x weekly - 3 sets - 10 reps  ASSESSMENT:  CLINICAL IMPRESSION: Reviewed goals and POC moving forward.  Pt comes today with list of when her pain symtoms in Lt hip/glute increase and states definitely in the morning when she is stiffer, bending for dishwasher/dryer, locking/unlocking son's wheelchair, mobilizing son/transfers, vacuuming.  States her L knee does bother her due to past injury and she wears thigh high compression garments.  Reports her balance is also off.  Discussed we would work on Estate manager/land agent  as this is possibly related to her LBP.  Educated on how her postural weakness is affecting her dysfunction; educated on Lt shoulder flexion  AAROM using stick/PVC as well as doorway stretch to encourage opening chest.  Unable to complete any further activities today as pt had a lot of questions and remaining testing to complete from evaluation.      OBJECTIVE IMPAIRMENTS: Abnormal gait, decreased activity tolerance, decreased balance, decreased endurance, decreased mobility, difficulty walking, decreased ROM, decreased strength, improper body mechanics, postural dysfunction, and pain.   ACTIVITY LIMITATIONS: carrying, lifting, bending, standing, squatting, stairs, and caring for others  PARTICIPATION LIMITATIONS: meal prep, cleaning, laundry, community activity, occupation, and yard work  PERSONAL FACTORS: Age, Fitness, Past/current experiences, Time since onset of injury/illness/exacerbation, and 1 comorbidity: hx of skin cancer are also affecting patient's functional outcome.   REHAB POTENTIAL: Fair pt has pain throughout body  CLINICAL DECISION MAKING: Evolving/moderate complexity  EVALUATION COMPLEXITY: Moderate   GOALS: Goals reviewed with patient? Yes  SHORT TERM GOALS: Target date: 04/05/24  Pt will be independent with HEP in order to demonstrate participation in Physical Therapy POC.  Baseline: Goal status: IN PROGRESS  2.  Pt will report 5/10 pain with mobility in order to demonstrate improved pain with ADLs.  Baseline:  Goal status: IN PROGRESS  LONG TERM GOALS: Target date: 04/26/24  Pt will improve 5TSTS by 2.3 seconds in order to demonstrate improved functional strength to return to desired activities.  Baseline: see objective.  Goal status: IN PROGRESS  2.  Pt will improve 2 MWT by 40 feet in order to demonstrate improved functional ambulatory capacity in community setting.  Baseline: see objective.  Goal status: IN PROGRESS  3.  Pt will improve LEFS score by 9  points in order to demonstrate decreased difficulty with ADL and improved quality of life.  Baseline: see objective.  Goal status: IN PROGRESS  4.  Pt will report 3/10 pain with mobility in order to demonstrate reduced pain with ADLs lasting greater than 30 minutes.  Baseline: see objective.  Goal status: IN PROGRESS   PLAN:  PT FREQUENCY: 1-2x/week  PT DURATION: 6 weeks  PLANNED INTERVENTIONS: 97110-Therapeutic exercises, 97530- Therapeutic activity, 97112- Neuromuscular re-education, 97535- Self Care, [id number]- Manual therapy, [phone]- Gait training, Patient/Family education, Balance training, Stair training, Joint mobilization, Spinal mobilization, DME instructions, Cryotherapy, and Moist heat.  PLAN FOR NEXT SESSION: continue to progress HEP; LE strengthening and core stabilization.  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   Greig KATHEE Fuse, PTA/CLT [facility] Ph: [phone] 4:25 PM, 03/23/24

## 2024-03-28 ENCOUNTER — Ambulatory Visit (HOSPITAL_COMMUNITY): Attending: Family Medicine

## 2024-03-28 DIAGNOSIS — Z8739 Personal history of other diseases of the musculoskeletal system and connective tissue: Secondary | ICD-10-CM | POA: Insufficient documentation

## 2024-03-28 DIAGNOSIS — Z7409 Other reduced mobility: Secondary | ICD-10-CM | POA: Insufficient documentation

## 2024-03-28 DIAGNOSIS — M6281 Muscle weakness (generalized): Secondary | ICD-10-CM | POA: Insufficient documentation

## 2024-03-28 DIAGNOSIS — R29898 Other symptoms and signs involving the musculoskeletal system: Secondary | ICD-10-CM | POA: Diagnosis not present

## 2024-03-28 NOTE — Therapy (Signed)
 OUTPATIENT PHYSICAL THERAPY THORACOLUMBAR TREATMENT   Patient Name: [name] MRN: [id number] DOB:November 09, 1955, 68 y.o., female Today's Date: 03/28/2024  END OF SESSION:  PT End of Session - 03/28/24 1525     Visit Number 3    Number of Visits 8    Date for PT Re-Evaluation 04/26/24    Authorization Type [employer]IC[employer] CHOICE PPO    Authorization Time Period 8 visits approved 6/18-8/1    Authorization - Number of Visits 3    Progress Note Due on Visit 8    PT Start Time 1305    PT Stop Time 1350    PT Time Calculation (min) 45 min    Activity Tolerance Patient tolerated treatment well;Patient limited by pain    Behavior During Therapy [facility] for tasks assessed/performed            Past Medical History:  Diagnosis Date   GERD (gastroesophageal reflux disease)    Hyperlipidemia    Lung nodule    left    Osteopenia    Osteoporosis    Squamous cell skin cancer    Right leg   Past Surgical History:  Procedure Laterality Date   BIOPSY  11/02/2019   Procedure: BIOPSY;  Surgeon: Shaaron Lamar HERO, MD;  Location: [facility];  Service: Endoscopy;;  gastric   Biopsy right leg Right    skin   COLONOSCOPY N/A 09/17/2017   Dr. Shaaron: normal colonoscopy.  Next colonoscopy in 10 years.   ESOPHAGOGASTRODUODENOSCOPY (EGD) WITH PROPOFOL  N/A 11/02/2019   Dr. Shaaron: Gastric mucosa coated by thick tenacious bile-stained mucus.  Stomach diffusely injected with scattered focal hemorrhagic erosions but no frank ulcer.  Small hiatal hernia.  Gastric biopsy showed nonspecific reactive gastropathy, no H. pylori.  Duodenum appeared normal.   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 91/23/2021   Gastritis and gastroduodenitis 12/06/2019   Left  sided abdominal pain 09/20/2019   Burping 09/20/2019   Loss of weight 09/20/2019   Change in bowel function 08/12/2017   Constipation 08/12/2017   Bloating 08/12/2017    PCP: [name] BRAVO, PA-C   REFERRING PROVIDER: [name], MD  REFERRING DIAG: M54.50,G89.29 (ICD-10-CM) - Chronic bilateral low back pain without sciatica M25.552 (ICD-10-CM) - Left hip pain  Rationale for Evaluation and Treatment: Rehabilitation  THERAPY DIAG:  Impaired functional mobility, balance, gait, and endurance  Muscle weakness (generalized)  Weakness of both lower extremities  ONSET DATE: Back, early 90s; hip, February 2024  SUBJECTIVE:  SUBJECTIVE STATEMENT: 03/28/2024: Discussion regarding continued care of pt's son and further desires for rehabilitation.  Evaluation: Pt describes extensive medical history low back pain, . 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 Pt states she is starting to look for help for caring for son as he is high maintenance. Pt report history of shoulder pain, left and right but left most recent. Pt reports her priorities would be her left knee and hip and low back. Pt states she thinks it from overuse from caring fro adult son. Pt describes shoulder pain has kept her from performing normal UE exercise. Pt states she really wants to take care of herself as she want to remain active and healthy as long has possible.  PERTINENT HISTORY:  Chiropractor/acupuncture, for low back pain Has had PT for low back occasionally over the years -Osteopenia -Left heel pain -2 years ago, left knee torn meniscus and ACL insidious, pain ever since -History of cancer   PAIN:  Are you having pain? Yes: NPRS scale: 7-8/10 Pain location: left knee, low back and left  hip Pain description: aching, dull Aggravating factors: standing and positional, caring for son Relieving factors: resting, pain meds  PRECAUTIONS: None  RED FLAGS: Hx of cancer   WEIGHT BEARING RESTRICTIONS: No  FALLS:  Has patient fallen in last 6 months? 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  DIAGNOSTIC FINDINGS:  CLINICAL DATA:  Epigastric pain. Bloating. Diarrhea. Pancreatic insufficiency. Weight loss.   EXAM: CT ABDOMEN AND PELVIS WITH CONTRAST   TECHNIQUE: Multidetector CT imaging of the abdomen and pelvis was performed using the standard protocol following bolus administration of intravenous contrast.   RADIATION DOSE REDUCTION: This exam was performed according to the departmental dose-optimization program which includes automated exposure control, adjustment of the mA and/or kV according to patient size and/or use of iterative reconstruction technique.   CONTRAST:  OMNIPAQUE  IOHEXOL  300 MG/ML  SOLN   COMPARISON:  07/23/2021   FINDINGS: Lower Chest: No acute findings.   Hepatobiliary: No hepatic masses identified. Gallbladder is unremarkable. No evidence of biliary ductal dilatation.   Pancreas: No mass or inflammatory changes. No signs of chronic pancreatitis or parenchymal atrophy.   Spleen: Within normal limits in size and appearance.   Adrenals/Urinary Tract: No suspicious masses identified. No evidence of ureteral calculi or hydronephrosis.   Stomach/Bowel: No evidence of obstruction, inflammatory process or abnormal fluid collections. Normal appendix visualized. Moderate-to-large amount of stool again seen throughout colon.   Vascular/Lymphatic: No pathologically enlarged lymph nodes. No  acute vascular findings. Aortic atherosclerotic calcification incidentally noted.   Reproductive:  No mass or other significant abnormality.   Other:  None.   Musculoskeletal:  No suspicious bone lesions identified.   IMPRESSION: No acute findings within the abdomen or pelvis.   Moderate-to-large stool burden noted; recommend clinical correlation for possible constipation.   Aortic Atherosclerosis (ICD10-I70.0).  PATIENT SURVEYS:  LEFS : TBA    03/23/24:  58/80=47.5%  COGNITION: Overall cognitive status: Within functional limits for tasks assessed     SENSATION: WFL   POSTURE: rounded shoulders, forward head, and flexed trunk   PALPATION: Pt tender to palpation of lateral left hip, site of greater trochanter.  LUMBAR ROM:   AROM eval  Flexion 75  Extension 10  Right lateral flexion Fingers to knees  Left lateral flexion Fingers to knees  Right rotation Full riange no pakn  Left rotation Full range no pian   (Blank rows = not tested)  LOWER EXTREMITY ROM:     Active  Right eval Left eval  Hip flexion    Hip extension    Hip abduction    Hip adduction    Hip internal rotation    Hip external rotation    Knee flexion    Knee extension    Ankle dorsiflexion    Ankle plantarflexion    Ankle inversion    Ankle eversion     (Blank rows = not tested)  LOWER EXTREMITY MMT:    MMT Right eval Left eval  Hip flexion 4 3+, pulling pain  Hip extension 4+ 4-  Hip abduction 4 4  Hip adduction 4 4  Hip internal rotation    Hip external rotation    Knee flexion 4 4-  Knee extension 4 4-  Ankle dorsiflexion 4 4  Ankle plantarflexion    Ankle inversion    Ankle eversion     (Blank rows = not tested)  HIP TESTS:  Positive Faber test on LLE  FUNCTIONAL TESTS:  5 times sit to stand: 17.07, pain in left hip 2 minute walk test: TBA  03/23/24: 450 feet no AD  GAIT: Distance walked: 75 feet to and from treatment area Assistive device utilized:  None Level of assistance: Complete Independence Comments: pt demonstrates decreased gait speed and decreased stride length bilaterally, no visible asymmetry noted on this date.  TREATMENT DATE:  03/28/2024  -Self care management: - - - - Discussion today involving patient's needs at home for taking care of dependent son.  Discussions involved care coordination between various providers to determine in-home needs, as well as potential referrals to get new hospital bed to ease patient's ADLs and caring for her son.  HEP updated as well for to reflect shoulder activities.  Review of HEP included in education details below  03/23/24 Goal review :  450 feet no AD LEFS: 58/80=47.5% Discussion of POC; symptoms and concerns Standing:  doorway stretch for chest Supine:  PVC shoulder flexion (for HEP)  03/15/2024  Evaluation: -ROM measured, Strength assessed, HEP prescribed, pt educated on prognosis, findings, and importance of HEP compliance if given.   PATIENT EDUCATION:  Education details: Pt was educated on findings of PT evaluation, prognosis, frequency of therapy visits and rationale, attendance policy, and HEP if given.   Person educated: Patient Education method: Explanation, Verbal cues, and Handouts Education comprehension: verbalized understanding, verbal cues required, tactile cues required, and needs further education  HOME EXERCISE PROGRAM: Access Code: [email or web address] URL: [email or web address] Date: 03/15/2024 Prepared by: Lang Ada  Exercises - Sit to Stand with Arms Crossed  - 1 x daily - 7 x weekly - 3 sets - 10 reps - Supine Bridge  - 1 x daily - 7 x weekly - 3 sets - 10 reps - Standing Hip Abduction with Counter Support  - 1 x daily - 7 x weekly - 3 sets - 10 reps   Access Code: [email or web address] URL: [email or web address] Date: 03/28/2024 Prepared by: Omega Bottcher  Exercises - Seated Scapular Retraction  - 1 x daily - 7 x weekly - 3 sets -  10 reps - Shoulder External Rotation and Scapular Retraction with Resistance  - 1 x daily - 7 x weekly - 3 sets - 10 reps -  Standing Shoulder Row with Anchored Resistance  - 1 x daily - 7 x weekly - 3 sets - 10 reps  ASSESSMENT:  CLINICAL IMPRESSION: No functional activities performed today.  Conversation base regarding self-care home management with patient's son who is who is primary caregiver.  Discussed with patient referral sources and resources to get new hospital bed to ease patient's level of burden of care for reaching to change son's linens.  next session to address more functional activities. Pt will benefit from skilled Physical Therapy services to address deficits/limitations in order to improve functional and QOL.   Reviewed goals and POC moving forward.  Pt comes today with list of when her pain symtoms in Lt hip/glute increase and states definitely in the morning when she is stiffer, bending for dishwasher/dryer, locking/unlocking son's wheelchair, mobilizing son/transfers, vacuuming.  States her L knee does bother her due to past injury and she wears thigh high compression garments.  Reports her balance is also off.  Discussed we would work on Estate manager/land agent as this is possibly related to her LBP.  Educated on how her postural weakness is affecting her dysfunction; educated on Lt shoulder flexion AAROM using stick/PVC as well as doorway stretch to encourage opening chest.  Unable to complete any further activities today as pt had a lot of questions and remaining testing to complete from evaluation.      OBJECTIVE IMPAIRMENTS: Abnormal gait, decreased activity tolerance, decreased balance, decreased endurance, decreased mobility, difficulty walking, decreased ROM, decreased strength, improper body mechanics, postural dysfunction, and pain.   ACTIVITY LIMITATIONS: carrying, lifting, bending, standing, squatting, stairs, and caring for others  PARTICIPATION LIMITATIONS: meal prep,  cleaning, laundry, community activity, occupation, and yard work  PERSONAL FACTORS: Age, Fitness, Past/current experiences, Time since onset of injury/illness/exacerbation, and 1 comorbidity: hx of skin cancer are also affecting patient's functional outcome.   REHAB POTENTIAL: Fair pt has pain throughout body  CLINICAL DECISION MAKING: Evolving/moderate complexity  EVALUATION COMPLEXITY: Moderate   GOALS: Goals reviewed with patient? Yes  SHORT TERM GOALS: Target date: 04/05/24  Pt will be independent with HEP in order to demonstrate participation in Physical Therapy POC.  Baseline: Goal status: IN PROGRESS  2.  Pt will report 5/10 pain with mobility in order to demonstrate improved pain with ADLs.  Baseline:  Goal status: IN PROGRESS  LONG TERM GOALS: Target date: 04/26/24  Pt will improve 5TSTS by 2.3 seconds in order to demonstrate improved functional strength to return to desired activities.  Baseline: see objective.  Goal status: IN PROGRESS  2.  Pt will improve 2 MWT by 40 feet in order to demonstrate improved functional ambulatory capacity in community setting.  Baseline: see objective.  Goal status: IN PROGRESS  3.  Pt will improve LEFS score by 9 points in order to demonstrate decreased difficulty with ADL and improved quality of life.  Baseline: see objective.  Goal status: IN PROGRESS  4.  Pt will report 3/10 pain with mobility in order to demonstrate reduced pain with ADLs lasting greater than 30 minutes.  Baseline: see objective.  Goal status: IN PROGRESS   PLAN:  PT FREQUENCY: 1-2x/week  PT DURATION: 6 weeks  PLANNED INTERVENTIONS: 97110-Therapeutic exercises, 97530- Therapeutic activity, 97112- Neuromuscular re-education, 97535- Self Care, [id number]- Manual therapy, [phone]- Gait training, Patient/Family education, Balance training, Stair training, Joint mobilization, Spinal mobilization, DME instructions, Cryotherapy, and Moist heat.  PLAN FOR NEXT  SESSION: continue to progress HEP; LE strengthening and core stabilization.  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   Greig KATHEE Fuse, PTA/CLT [facility] Ph: [phone] 4:16 PM, 03/28/24

## 2024-04-03 ENCOUNTER — Ambulatory Visit (HOSPITAL_COMMUNITY)

## 2024-04-03 ENCOUNTER — Encounter (HOSPITAL_COMMUNITY): Payer: Self-pay

## 2024-04-03 DIAGNOSIS — M6281 Muscle weakness (generalized): Secondary | ICD-10-CM | POA: Diagnosis not present

## 2024-04-03 DIAGNOSIS — Z8739 Personal history of other diseases of the musculoskeletal system and connective tissue: Secondary | ICD-10-CM | POA: Diagnosis not present

## 2024-04-03 DIAGNOSIS — Z7409 Other reduced mobility: Secondary | ICD-10-CM

## 2024-04-03 DIAGNOSIS — R29898 Other symptoms and signs involving the musculoskeletal system: Secondary | ICD-10-CM

## 2024-04-03 NOTE — Therapy (Signed)
 OUTPATIENT PHYSICAL THERAPY THORACOLUMBAR TREATMENT   Patient Name: [name] MRN: [id number] DOB:October 31, 1955, 68 y.o., female Today's Date: 04/03/2024  END OF SESSION:  PT End of Session - 04/03/24 1302     Visit Number 4    Number of Visits 8    Date for PT Re-Evaluation 04/26/24    Authorization Type [employer]IC[employer] CHOICE PPO    Authorization Time Period 8 visits approved 6/18-8/1    Authorization - Number of Visits 3    Progress Note Due on Visit 8    PT Start Time 1303    PT Stop Time 1354    PT Time Calculation (min) 51 min    Activity Tolerance Patient tolerated treatment well    Behavior During Therapy WFL for tasks assessed/performed            Past Medical History:  Diagnosis Date   GERD (gastroesophageal reflux disease)    Hyperlipidemia    Lung nodule    left    Osteopenia    Osteoporosis    Squamous cell skin cancer    Right leg   Past Surgical History:  Procedure Laterality Date   BIOPSY  11/02/2019   Procedure: BIOPSY;  Surgeon: Shaaron Lamar HERO, MD;  Location: [facility];  Service: Endoscopy;;  gastric   Biopsy right leg Right    skin   COLONOSCOPY N/A 09/17/2017   Dr. Shaaron: normal colonoscopy.  Next colonoscopy in 10 years.   ESOPHAGOGASTRODUODENOSCOPY (EGD) WITH PROPOFOL  N/A 11/02/2019   Dr. Shaaron: Gastric mucosa coated by thick tenacious bile-stained mucus.  Stomach diffusely injected with scattered focal hemorrhagic erosions but no frank ulcer.  Small hiatal hernia.  Gastric biopsy showed nonspecific reactive gastropathy, no H. pylori.  Duodenum appeared normal.   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 48/23/2021   Gastritis and gastroduodenitis 12/06/2019   Left sided abdominal pain  09/20/2019   Burping 09/20/2019   Loss of weight 09/20/2019   Change in bowel function 08/12/2017   Constipation 08/12/2017   Bloating 08/12/2017    PCP: [name] BRAVO, PA-C   REFERRING PROVIDER: [name], MD  REFERRING DIAG: M54.50,G89.29 (ICD-10-CM) - Chronic bilateral low back pain without sciatica M25.552 (ICD-10-CM) - Left hip pain  Rationale for Evaluation and Treatment: Rehabilitation  THERAPY DIAG:  Impaired functional mobility, balance, gait, and endurance  Muscle weakness (generalized)  Weakness of both lower extremities  ONSET DATE: Back, early 90s; hip, February 2024  SUBJECTIVE:  SUBJECTIVE STATEMENT: Reports since last visit, he son has begin to form wound at gluteal fold. Reports its been more on her shoulder. Reports her L shoulder has been sore and hurting her more. Reports she would like to work on ergonomics, balance and strength.    Evaluation: Pt describes extensive medical history low back pain, . 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 Pt states she is starting to look for help for caring for son as he is high maintenance. Pt report history of shoulder pain, left and right but left most recent. Pt reports her priorities would be her left knee and hip and low back. Pt states she thinks it from overuse from caring fro adult son. Pt describes shoulder pain has kept her from performing normal UE exercise. Pt states she really wants to take care of herself as she want to remain active and healthy as long has possible.  PERTINENT HISTORY:  Chiropractor/acupuncture, for low back pain Has had PT for low back occasionally over the years -Osteopenia -Left heel pain -2 years ago, left knee torn meniscus and ACL insidious, pain ever  since -History of cancer   PAIN:  Are you having pain? Yes: NPRS scale: 7-8/10 Pain location: left knee, low back and left hip Pain description: aching, dull Aggravating factors: standing and positional, caring for son Relieving factors: resting, pain meds  PRECAUTIONS: None  RED FLAGS: Hx of cancer   WEIGHT BEARING RESTRICTIONS: No  FALLS:  Has patient fallen in last 6 months? 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  DIAGNOSTIC FINDINGS:  CLINICAL DATA:  Epigastric pain. Bloating. Diarrhea. Pancreatic insufficiency. Weight loss.   EXAM: CT ABDOMEN AND PELVIS WITH CONTRAST   TECHNIQUE: Multidetector CT imaging of the abdomen and pelvis was performed using the standard protocol following bolus administration of intravenous contrast.   RADIATION DOSE REDUCTION: This exam was performed according to the departmental dose-optimization program which includes automated exposure control, adjustment of the mA and/or kV according to patient size and/or use of iterative reconstruction technique.   CONTRAST:  OMNIPAQUE  IOHEXOL  300 MG/ML  SOLN   COMPARISON:  07/23/2021   FINDINGS: Lower Chest: No acute findings.   Hepatobiliary: No hepatic masses identified. Gallbladder is unremarkable. No evidence of biliary ductal dilatation.   Pancreas: No mass or inflammatory changes. No signs of chronic pancreatitis or parenchymal atrophy.   Spleen: Within normal limits in size and appearance.   Adrenals/Urinary Tract: No suspicious masses identified. No evidence of ureteral calculi or hydronephrosis.   Stomach/Bowel: No evidence of obstruction, inflammatory process or abnormal fluid collections. Normal appendix  visualized. Moderate-to-large amount of stool again seen throughout colon.   Vascular/Lymphatic: No pathologically enlarged lymph nodes. No acute vascular findings. Aortic atherosclerotic calcification incidentally noted.   Reproductive:  No mass or other significant abnormality.   Other:  None.   Musculoskeletal:  No suspicious bone lesions identified.   IMPRESSION: No acute findings within the abdomen or pelvis.   Moderate-to-large stool burden noted; recommend clinical correlation for possible constipation.   Aortic Atherosclerosis (ICD10-I70.0).  PATIENT SURVEYS:  LEFS : TBA    03/23/24:  58/80=47.5%  COGNITION: Overall cognitive status: Within functional limits for tasks assessed     SENSATION: [facility]  POSTURE: rounded shoulders, forward head, and flexed trunk   PALPATION: Pt tender to palpation of lateral left hip, site of greater trochanter.  LUMBAR ROM:   AROM eval  Flexion 75  Extension 10  Right lateral flexion Fingers to knees  Left lateral flexion Fingers to knees  Right rotation Full riange no pakn  Left rotation Full range no pian   (Blank rows = not tested)  LOWER EXTREMITY ROM:     Active  Right eval Left eval  Hip flexion    Hip extension    Hip abduction    Hip adduction    Hip internal rotation    Hip external rotation    Knee flexion    Knee extension    Ankle dorsiflexion    Ankle plantarflexion    Ankle inversion    Ankle eversion     (Blank rows = not tested)  LOWER EXTREMITY MMT:    MMT Right eval Left eval  Hip flexion 4 3+, pulling pain  Hip extension 4+ 4-  Hip abduction 4 4  Hip adduction 4 4  Hip internal rotation    Hip external rotation    Knee flexion 4 4-  Knee extension 4 4-  Ankle dorsiflexion 4 4  Ankle plantarflexion    Ankle inversion    Ankle eversion     (Blank rows = not tested)  HIP TESTS:  Positive Faber test on LLE  FUNCTIONAL TESTS:  5 times sit to stand: 17.07, pain in left hip 2  minute walk test: TBA  03/23/24: 450 feet no AD  GAIT: Distance walked: 75 feet to and from treatment area Assistive device utilized: None Level of assistance: Complete Independence Comments: pt demonstrates decreased gait speed and decreased stride length bilaterally, no visible asymmetry noted on this date.  TREATMENT DATE:  04/03/24:  Discussion and mass practice of mechanics for patient rolling  Instructed on squat mechanics, getting patient/son as close to her/EOB during bed mobility to reduce pressure on low back, importance of using LE rather than UE to push/pull to reduce discomfort in UE, using drawsheet to her advantage to pull son to EOB, using husband for assist as needed, how to provide pressure relief for son using roll technique and pillows.   03/28/2024  -Self care management: - Discussion today involving patient's needs at home for taking care of dependent son.  Discussions involved care coordination between various providers to determine in-home needs, as well as potential referrals to get new hospital bed to ease patient's ADLs and caring for her son.  HEP updated as well for to reflect shoulder activities.  Review of HEP included in education details below  03/23/24 Goal review :  450 feet no AD LEFS: 58/80=47.5% Discussion of POC; symptoms and concerns Standing:  doorway stretch for chest Supine:  PVC shoulder flexion (for HEP)  PATIENT EDUCATION:  Education details: Pt was educated on findings of PT evaluation, prognosis, frequency of therapy visits and rationale, attendance policy, and HEP if given.   Person educated: Patient Education method: Explanation, Verbal cues, and Handouts Education comprehension: verbalized understanding, verbal cues required, tactile cues required, and needs further education  HOME EXERCISE PROGRAM: Access Code: [email or web address] URL: [email or web address] Date: 03/15/2024 Prepared by: Lang Ada  Exercises - Sit to Stand  with Arms Crossed  - 1 x daily - 7 x weekly - 3 sets - 10 reps - Supine Bridge  - 1 x daily - 7 x weekly - 3 sets - 10 reps -  Standing Hip Abduction with Counter Support  - 1 x daily - 7 x weekly - 3 sets - 10 reps   Access Code: [email or web address] URL: [email or web address] Date: 03/28/2024 Prepared by: Omega Bottcher  Exercises - Seated Scapular Retraction  - 1 x daily - 7 x weekly - 3 sets - 10 reps - Shoulder External Rotation and Scapular Retraction with Resistance  - 1 x daily - 7 x weekly - 3 sets - 10 reps - Standing Shoulder Row with Anchored Resistance  - 1 x daily - 7 x weekly - 3 sets - 10 reps    ASSESSMENT:  CLINICAL IMPRESSION: Spent session discussing and practicing bed mobility mechanics for caregivers, as patient is primary caregiver for son. Patient reports most of her physical work with son is bed mobility, rolling, pushing, and pulling. He has an over head lift to get him into w/c. Reports she's been doing more this week and her L shoulder has increased discomfort, as son has formed mild skin irritation under L gluteal fold from recent hospital stay. Can see specifics for verbal cueing and practice of bed mobility under Treatment. Print out given for rolling mechanics and pressure relief. Quick review of HEP and stressed importance of HEP compliance. Pt will benefit from skilled Physical Therapy services to address deficits/limitations in order to improve functional and QOL.    OBJECTIVE IMPAIRMENTS: Abnormal gait, decreased activity tolerance, decreased balance, decreased endurance, decreased mobility, difficulty walking, decreased ROM, decreased strength, improper body mechanics, postural dysfunction, and pain.   ACTIVITY LIMITATIONS: carrying, lifting, bending, standing, squatting, stairs, and caring for others  PARTICIPATION LIMITATIONS: meal prep, cleaning, laundry, community activity, occupation, and yard work  PERSONAL FACTORS: Age, Fitness, Past/current  experiences, Time since onset of injury/illness/exacerbation, and 1 comorbidity: hx of skin cancer are also affecting patient's functional outcome.   REHAB POTENTIAL: Fair pt has pain throughout body  CLINICAL DECISION MAKING: Evolving/moderate complexity  EVALUATION COMPLEXITY: Moderate   GOALS: Goals reviewed with patient? Yes  SHORT TERM GOALS: Target date: 04/05/24  Pt will be independent with HEP in order to demonstrate participation in Physical Therapy POC.  Baseline: Goal status: IN PROGRESS  2.  Pt will report 5/10 pain with mobility in order to demonstrate improved pain with ADLs.  Baseline:  Goal status: IN PROGRESS  LONG TERM GOALS: Target date: 04/26/24  Pt will improve 5TSTS by 2.3 seconds in order to demonstrate improved functional strength to return to desired activities.  Baseline: see objective.  Goal status: IN PROGRESS  2.  Pt will improve 2 MWT by 40 feet in order to demonstrate improved functional ambulatory capacity in community setting.  Baseline: see objective.  Goal status: IN PROGRESS  3.  Pt will improve LEFS score by 9 points in order to demonstrate decreased difficulty with ADL and improved quality of life.  Baseline: see objective.  Goal status: IN PROGRESS  4.  Pt will report 3/10 pain with mobility in order to demonstrate reduced pain with ADLs lasting greater than 30 minutes.  Baseline: see objective.  Goal status: IN PROGRESS   PLAN:  PT FREQUENCY: 1-2x/week  PT DURATION: 6 weeks  PLANNED INTERVENTIONS: 97110-Therapeutic exercises, 97530- Therapeutic activity, 97112- Neuromuscular re-education, 97535- Self Care, [id number]- Manual therapy, [phone]- Gait training, Patient/Family education, Balance training, Stair training, Joint mobilization, Spinal mobilization, DME instructions, Cryotherapy, and Moist heat.  PLAN FOR NEXT SESSION: continue to progress HEP; LE strengthening and core stabilization.  Next session review HEP,  begin postural  strengthening with  bands, cont. 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   2:18 PM, 04/03/24 Rosaria Settler, PT, DPT [facility] [place]

## 2024-04-06 DIAGNOSIS — M79675 Pain in left toe(s): Secondary | ICD-10-CM | POA: Diagnosis not present

## 2024-04-06 DIAGNOSIS — L84 Corns and callosities: Secondary | ICD-10-CM | POA: Diagnosis not present

## 2024-04-06 DIAGNOSIS — B351 Tinea unguium: Secondary | ICD-10-CM | POA: Diagnosis not present

## 2024-04-06 DIAGNOSIS — M79674 Pain in right toe(s): Secondary | ICD-10-CM | POA: Diagnosis not present

## 2024-04-06 DIAGNOSIS — I70203 Unspecified atherosclerosis of native arteries of extremities, bilateral legs: Secondary | ICD-10-CM | POA: Diagnosis not present

## 2024-04-11 DIAGNOSIS — H43393 Other vitreous opacities, bilateral: Secondary | ICD-10-CM | POA: Diagnosis not present

## 2024-04-13 ENCOUNTER — Encounter (HOSPITAL_COMMUNITY): Payer: Self-pay

## 2024-04-13 ENCOUNTER — Ambulatory Visit (HOSPITAL_COMMUNITY)

## 2024-04-13 DIAGNOSIS — R29898 Other symptoms and signs involving the musculoskeletal system: Secondary | ICD-10-CM | POA: Diagnosis not present

## 2024-04-13 DIAGNOSIS — Z7409 Other reduced mobility: Secondary | ICD-10-CM | POA: Diagnosis not present

## 2024-04-13 DIAGNOSIS — Z8739 Personal history of other diseases of the musculoskeletal system and connective tissue: Secondary | ICD-10-CM

## 2024-04-13 DIAGNOSIS — M6281 Muscle weakness (generalized): Secondary | ICD-10-CM | POA: Diagnosis not present

## 2024-04-13 NOTE — Therapy (Signed)
 OUTPATIENT PHYSICAL THERAPY THORACOLUMBAR TREATMENT   Patient Name: CAROLIE MCILRATH MRN: [id number] DOB:[birth year], 68 y.o., female Today's Date: 04/13/2024  END OF SESSION:  PT End of Session - 04/13/24 1302     Visit Number 5    Number of Visits 8    Date for PT Re-Evaluation 04/26/24    Authorization Type [employer]IC[employer] CHOICE PPO    Authorization Time Period 8 visits approved 6/18-8/1    Authorization - Number of Visits 4    Progress Note Due on Visit 8    PT Start Time 1302    PT Stop Time 1346    PT Time Calculation (min) 44 min    Activity Tolerance Patient tolerated treatment well    Behavior During Therapy WFL for tasks assessed/performed            Past Medical History:  Diagnosis Date   GERD (gastroesophageal reflux disease)    Hyperlipidemia    Lung nodule    left    Osteopenia    Osteoporosis    Squamous cell skin cancer    Right leg   Past Surgical History:  Procedure Laterality Date   BIOPSY  11/02/2019   Procedure: BIOPSY;  Surgeon: Shaaron Lamar HERO, MD;  Location: [facility];  Service: Endoscopy;;  gastric   Biopsy right leg Right    skin   COLONOSCOPY N/A 09/17/2017   Dr. Shaaron: normal colonoscopy.  Next colonoscopy in 10 years.   ESOPHAGOGASTRODUODENOSCOPY (EGD) WITH PROPOFOL  N/A 11/02/2019   Dr. Shaaron: Gastric mucosa coated by thick tenacious bile-stained mucus.  Stomach diffusely injected with scattered focal hemorrhagic erosions but no frank ulcer.  Small hiatal hernia.  Gastric biopsy showed nonspecific reactive gastropathy, no H. pylori.  Duodenum appeared normal.   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 49/23/2021   Gastritis and gastroduodenitis 12/06/2019   Left sided abdominal pain  09/20/2019   Burping 09/20/2019   Loss of weight 09/20/2019   Change in bowel function 08/12/2017   Constipation 08/12/2017   Bloating 08/12/2017    PCP: [name] BRAVO, PA-C   REFERRING PROVIDER: [name], MD  REFERRING DIAG: M54.50,G89.29 (ICD-10-CM) - Chronic bilateral low back pain without sciatica M25.552 (ICD-10-CM) - Left hip pain  Rationale for Evaluation and Treatment: Rehabilitation  THERAPY DIAG:  Impaired functional mobility, balance, gait, and endurance  Muscle weakness (generalized)  Weakness of both lower extremities  History of low back pain  ONSET DATE: Back, early 90s; hip, February 2024  SUBJECTIVE:  SUBJECTIVE STATEMENT: 04/13/24:  No pain currently, pain with some movements.  Feels her Lt ACL injury and therapy in 2023 have altered her current hip and back pain.  Has been compliant with HEP that she feels are helping.  Lt shoulder continues to hurt while transferring son.  Would like to work on ergonomics, balance and strength.    Evaluation: Pt describes extensive medical history low back pain, . 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 Pt states she is starting to look for help for caring for son as he is high maintenance. Pt report history of shoulder pain, left and right but left most recent. Pt reports her priorities would be her left knee and hip and low back. Pt states she thinks it from overuse from caring fro adult son. Pt describes shoulder pain has kept her from performing normal UE exercise. Pt states she really wants to take care of herself as she want to remain active and healthy as long has possible.  PERTINENT HISTORY:  Chiropractor/acupuncture, for low back pain Has had PT for low back occasionally over the  years -Osteopenia -Left heel pain -2 years ago, left knee torn meniscus and ACL insidious, pain ever since -History of cancer   PAIN:  Are you having pain? Yes: NPRS scale: 7-8/10 Pain location: left knee, low back and left hip Pain description: aching, dull Aggravating factors: standing and positional, caring for son Relieving factors: resting, pain meds  PRECAUTIONS: None  RED FLAGS: Hx of cancer   WEIGHT BEARING RESTRICTIONS: No  FALLS:  Has patient fallen in last 6 months? 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  DIAGNOSTIC FINDINGS:  CLINICAL DATA:  Epigastric pain. Bloating. Diarrhea. Pancreatic insufficiency. Weight loss.   EXAM: CT ABDOMEN AND PELVIS WITH CONTRAST   TECHNIQUE: Multidetector CT imaging of the abdomen and pelvis was performed using the standard protocol following bolus administration of intravenous contrast.   RADIATION DOSE REDUCTION: This exam was performed according to the departmental dose-optimization program which includes automated exposure control, adjustment of the mA and/or kV according to patient size and/or use of iterative reconstruction technique.   CONTRAST:  OMNIPAQUE  IOHEXOL  300 MG/ML  SOLN   COMPARISON:  07/23/2021   FINDINGS: Lower Chest: No acute findings.   Hepatobiliary: No hepatic masses identified. Gallbladder is unremarkable. No evidence of biliary ductal dilatation.   Pancreas: No mass or inflammatory changes. No signs of chronic pancreatitis or parenchymal atrophy.   Spleen: Within normal limits in size and appearance.   Adrenals/Urinary Tract: No suspicious masses identified. No evidence of ureteral calculi or hydronephrosis.   Stomach/Bowel: No  evidence of obstruction, inflammatory process or abnormal fluid collections. Normal appendix visualized. Moderate-to-large amount of stool again seen throughout colon.   Vascular/Lymphatic: No pathologically enlarged lymph nodes. No acute vascular findings. Aortic atherosclerotic calcification incidentally noted.   Reproductive:  No mass or other significant abnormality.   Other:  None.   Musculoskeletal:  No suspicious bone lesions identified.   IMPRESSION: No acute findings within the abdomen or pelvis.   Moderate-to-large stool burden noted; recommend clinical correlation for possible constipation.   Aortic Atherosclerosis (ICD10-I70.0).  PATIENT SURVEYS:  LEFS : TBA    03/23/24:  58/80=47.5%  COGNITION: Overall  cognitive status: Within functional limits for tasks assessed     SENSATION: WFL   POSTURE: rounded shoulders, forward head, and flexed trunk   PALPATION: Pt tender to palpation of lateral left hip, site of greater trochanter.  LUMBAR ROM:   AROM eval  Flexion 75  Extension 10  Right lateral flexion Fingers to knees  Left lateral flexion Fingers to knees  Right rotation Full riange no pakn  Left rotation Full range no pian   (Blank rows = not tested)  LOWER EXTREMITY ROM:     Active  Right eval Left eval  Hip flexion    Hip extension    Hip abduction    Hip adduction    Hip internal rotation    Hip external rotation    Knee flexion    Knee extension    Ankle dorsiflexion    Ankle plantarflexion    Ankle inversion    Ankle eversion     (Blank rows = not tested)  LOWER EXTREMITY MMT:    MMT Right eval Left eval  Hip flexion 4 3+, pulling pain  Hip extension 4+ 4-  Hip abduction 4 4  Hip adduction 4 4  Hip internal rotation    Hip external rotation    Knee flexion 4 4-  Knee extension 4 4-  Ankle dorsiflexion 4 4  Ankle plantarflexion    Ankle inversion    Ankle eversion     (Blank rows = not tested)  HIP TESTS:  Positive  Faber test on LLE  FUNCTIONAL TESTS:  5 times sit to stand: 17.07, pain in left hip 2 minute walk test: TBA  03/23/24: 450 feet no AD  GAIT: Distance walked: 75 feet to and from treatment area Assistive device utilized: None Level of assistance: Complete Independence Comments: pt demonstrates decreased gait speed and decreased stride length bilaterally, no visible asymmetry noted on this date.  TREATMENT DATE:  04/13/24: RTB rows 10x RTB shoulder extension 10x  Squat 10x  3  Reviewed body mechanics for sons's bed mobility.  Instructed on squat mechanics, being close to patient/, instructed to flex opposite extremity to assist with rolling, to lift with legs not back.  04/03/24:  Discussion and mass practice of mechanics for patient rolling  Instructed on squat mechanics, getting patient/son as close to her/EOB during bed mobility to reduce pressure on low back, importance of using LE rather than UE to push/pull to reduce discomfort in UE, using drawsheet to her advantage to pull son to EOB, using husband for assist as needed, how to provide pressure relief for son using roll technique and pillows.   03/28/2024  -Self care management: - Discussion today involving patient's needs at home for taking care of dependent son.  Discussions involved care coordination between various providers to determine in-home needs, as well as potential referrals to get new hospital bed to ease patient's ADLs and caring for her son.  HEP updated as well for to reflect shoulder activities.  Review of HEP included in education details below  03/23/24 Goal review :  450 feet no AD LEFS: 58/80=47.5% Discussion of POC; symptoms and concerns Standing:  doorway stretch for chest Supine:  PVC shoulder flexion (for HEP)  PATIENT EDUCATION:  Education details: Pt was educated on findings of PT evaluation, prognosis, frequency of therapy visits and rationale, attendance policy, and HEP if given.   Person  educated: Patient Education method: Explanation, Verbal cues, and Handouts Education comprehension: verbalized understanding, verbal cues required, tactile cues  required, and needs further education  HOME EXERCISE PROGRAM: Access Code: [email or web address] URL: [email or web address] Date: 03/15/2024 Prepared by: Lang Ada  Exercises - Sit to Stand with Arms Crossed  - 1 x daily - 7 x weekly - 3 sets - 10 reps - Supine Bridge  - 1 x daily - 7 x weekly - 3 sets - 10 reps - Standing Hip Abduction with Counter Support  - 1 x daily - 7 x weekly - 3 sets - 10 reps   Access Code: [email or web address] URL: [email or web address] Date: 03/28/2024 Prepared by: Omega Bottcher  Exercises - Seated Scapular Retraction  - 1 x daily - 7 x weekly - 3 sets - 10 reps - Shoulder External Rotation and Scapular Retraction with Resistance  - 1 x daily - 7 x weekly - 3 sets - 10 reps - Standing Shoulder Row with Anchored Resistance  - 1 x daily - 7 x weekly - 3 sets - 10 reps  04/13/24:  - Squat with Chair Touch  - 2 x daily - 7 x weekly - 1 sets - 10 reps    ASSESSMENT:  CLINICAL IMPRESSION: Reviewed mechanics to assist with care given for her son.  Educated importance of lifting with legs and not back, to keep son close to body and shown some rolling techniques to assist with bed mobility and pressure relief.  Added squats to HEP for gluteal strengthening.  Pt educated on importance of posture for pain control, added theraband resistance with good form following initial instructions.   OBJECTIVE IMPAIRMENTS: Abnormal gait, decreased activity tolerance, decreased balance, decreased endurance, decreased mobility, difficulty walking, decreased ROM, decreased strength, improper body mechanics, postural dysfunction, and pain.   ACTIVITY LIMITATIONS: carrying, lifting, bending, standing, squatting, stairs, and caring for others  PARTICIPATION LIMITATIONS: meal prep, cleaning, laundry, community  activity, occupation, and yard work  PERSONAL FACTORS: Age, Fitness, Past/current experiences, Time since onset of injury/illness/exacerbation, and 1 comorbidity: hx of skin cancer are also affecting patient's functional outcome.   REHAB POTENTIAL: Fair pt has pain throughout body  CLINICAL DECISION MAKING: Evolving/moderate complexity  EVALUATION COMPLEXITY: Moderate   GOALS: Goals reviewed with patient? Yes  SHORT TERM GOALS: Target date: 04/05/24  Pt will be independent with HEP in order to demonstrate participation in Physical Therapy POC.  Baseline: Goal status: IN PROGRESS  2.  Pt will report 5/10 pain with mobility in order to demonstrate improved pain with ADLs.  Baseline:  Goal status: IN PROGRESS  LONG TERM GOALS: Target date: 04/26/24  Pt will improve 5TSTS by 2.3 seconds in order to demonstrate improved functional strength to return to desired activities.  Baseline: see objective.  Goal status: IN PROGRESS  2.  Pt will improve 2 MWT by 40 feet in order to demonstrate improved functional ambulatory capacity in community setting.  Baseline: see objective.  Goal status: IN PROGRESS  3.  Pt will improve LEFS score by 9 points in order to demonstrate decreased difficulty with ADL and improved quality of life.  Baseline: see objective.  Goal status: IN PROGRESS  4.  Pt will report 3/10 pain with mobility in order to demonstrate reduced pain with ADLs lasting greater than 30 minutes.  Baseline: see objective.  Goal status: IN PROGRESS   PLAN:  PT FREQUENCY: 1-2x/week  PT DURATION: 6 weeks  PLANNED INTERVENTIONS: 97110-Therapeutic exercises, 97530- Therapeutic activity, 97112- Neuromuscular re-education, 97535- Self Care, [id number]- Manual therapy, [phone]- Gait training, Patient/Family education, Balance training, Stair training, Joint mobilization, Spinal mobilization, DME instructions, Cryotherapy,  and Moist heat.  PLAN FOR NEXT SESSION: continue to progress HEP;  LE strengthening and core stabilization.  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   Augustin Mclean, LPTA/CLT; CBIS [phone]  2:13 PM, 04/13/24

## 2024-04-19 ENCOUNTER — Ambulatory Visit (HOSPITAL_COMMUNITY): Admitting: Physical Therapy

## 2024-04-19 DIAGNOSIS — E785 Hyperlipidemia, unspecified: Secondary | ICD-10-CM | POA: Diagnosis not present

## 2024-04-19 DIAGNOSIS — R319 Hematuria, unspecified: Secondary | ICD-10-CM | POA: Diagnosis not present

## 2024-04-19 DIAGNOSIS — Z7409 Other reduced mobility: Secondary | ICD-10-CM | POA: Diagnosis not present

## 2024-04-19 DIAGNOSIS — R29898 Other symptoms and signs involving the musculoskeletal system: Secondary | ICD-10-CM | POA: Diagnosis not present

## 2024-04-19 DIAGNOSIS — Z681 Body mass index (BMI) 19 or less, adult: Secondary | ICD-10-CM | POA: Diagnosis not present

## 2024-04-19 DIAGNOSIS — Z8739 Personal history of other diseases of the musculoskeletal system and connective tissue: Secondary | ICD-10-CM | POA: Diagnosis not present

## 2024-04-19 DIAGNOSIS — R7303 Prediabetes: Secondary | ICD-10-CM | POA: Diagnosis not present

## 2024-04-19 DIAGNOSIS — M6281 Muscle weakness (generalized): Secondary | ICD-10-CM | POA: Diagnosis not present

## 2024-04-19 DIAGNOSIS — R109 Unspecified abdominal pain: Secondary | ICD-10-CM | POA: Diagnosis not present

## 2024-04-19 NOTE — Therapy (Signed)
 OUTPATIENT PHYSICAL THERAPY THORACOLUMBAR TREATMENT   Patient Name: [name] MRN: [id number] DOB:Jul 09, 1956, 68 y.o., female Today's Date: 04/19/2024  END OF SESSION:  PT End of Session - 04/19/24 1355     Visit Number 6    Number of Visits 8    Date for PT Re-Evaluation 04/26/24    Authorization Type [employer] CHO[employer] PPO    Authorization Time Period 8 visits approved 6/18-8/1    Authorization - Visit Number 6    Authorization - Number of Visits 8    Progress Note Due on Visit 8    PT Start Time 1302    PT Stop Time 1346    PT Time Calculation (min) 44 min    Activity Tolerance Patient tolerated treatment well    Behavior During Therapy WFL for tasks assessed/performed             Past Medical History:  Diagnosis Date   GERD (gastroesophageal reflux disease)    Hyperlipidemia    Lung nodule    left    Osteopenia    Osteoporosis    Squamous cell skin cancer    Right leg   Past Surgical History:  Procedure Laterality Date   BIOPSY  11/02/2019   Procedure: BIOPSY;  Surgeon: Shaaron Lamar HERO, MD;  Location: [facility];  Service: Endoscopy;;  gastric   Biopsy right leg Right    skin   COLONOSCOPY N/A 09/17/2017   Dr. Shaaron: normal colonoscopy.  Next colonoscopy in 10 years.   ESOPHAGOGASTRODUODENOSCOPY (EGD) WITH PROPOFOL  N/A 11/02/2019   Dr. Shaaron: Gastric mucosa coated by thick tenacious bile-stained mucus.  Stomach diffusely injected with scattered focal hemorrhagic erosions but no frank ulcer.  Small hiatal hernia.  Gastric biopsy showed nonspecific reactive gastropathy, no H. pylori.  Duodenum appeared normal.   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 72/23/2021   Gastritis and gastroduodenitis  12/06/2019   Left sided abdominal pain 09/20/2019   Burping 09/20/2019   Loss of weight 09/20/2019   Change in bowel function 08/12/2017   Constipation 08/12/2017   Bloating 08/12/2017    PCP: [name] BRAVO, PA-C   REFERRING PROVIDER: [name], MD  REFERRING DIAG: M54.50,G89.29 (ICD-10-CM) - Chronic bilateral low back pain without sciatica M25.552 (ICD-10-CM) - Left hip pain  Rationale for Evaluation and Treatment: Rehabilitation  THERAPY DIAG:  Impaired functional mobility, balance, gait, and endurance  Muscle weakness (generalized)  Weakness of both lower extremities  ONSET DATE: Back, early 90s; hip, February 2024  SUBJECTIVE:  SUBJECTIVE STATEMENT: Back and hip are getting better.  Most pain in Lt shoulder and Lt knee (which she does not have order for). Work with transferring and education with her disabled son has helped some.   04/13/24:  No pain currently, pain with some movements.  Feels her Lt ACL injury and therapy in 2023 have altered her current hip and back pain.  Has been compliant with HEP that she feels are helping.  Lt shoulder continues to hurt while transferring son.  Would like to work on ergonomics, balance and strength.    Evaluation: Pt describes extensive medical history low back pain, . 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 Pt states she is starting to look for help for caring for son as he is high maintenance. Pt report history of shoulder pain, left and right but left most recent. Pt reports her priorities would be her left knee and hip and low back. Pt states she thinks it from overuse from caring fro adult son. Pt describes shoulder pain has kept her from performing normal UE exercise. Pt states she really wants to take care  of herself as she want to remain active and healthy as long has possible.  PERTINENT HISTORY:  Chiropractor/acupuncture, for low back pain Has had PT for low back occasionally over the years -Osteopenia -Left heel pain -2 years ago, left knee torn meniscus and ACL insidious, pain ever since -History of cancer   PAIN:  Are you having pain? Yes: NPRS scale: 7-8/10 Pain location: left knee, low back and left hip Pain description: aching, dull Aggravating factors: standing and positional, caring for son Relieving factors: resting, pain meds  PRECAUTIONS: None  RED FLAGS: Hx of cancer   WEIGHT BEARING RESTRICTIONS: No  FALLS:  Has patient fallen in last 6 months? 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  DIAGNOSTIC FINDINGS:  CLINICAL DATA:  Epigastric pain. Bloating. Diarrhea. Pancreatic insufficiency. Weight loss.   EXAM: CT ABDOMEN AND PELVIS WITH CONTRAST   TECHNIQUE: Multidetector CT imaging of the abdomen and pelvis was performed using the standard protocol following bolus administration of intravenous contrast.   RADIATION DOSE REDUCTION: This exam was performed according to the departmental dose-optimization program which includes automated exposure control, adjustment of the mA and/or kV according to patient size and/or use of iterative reconstruction technique.   CONTRAST:  OMNIPAQUE  IOHEXOL  300 MG/ML  SOLN   COMPARISON:  07/23/2021   FINDINGS: Lower Chest: No acute findings.   Hepatobiliary: No hepatic masses identified. Gallbladder is unremarkable. No evidence of biliary ductal dilatation.   Pancreas: No mass or inflammatory changes. No signs of chronic pancreatitis or parenchymal atrophy.    Spleen: Within normal limits in size and appearance.   Adrenals/Urinary Tract: No suspicious masses identified. No evidence of ureteral calculi or hydronephrosis.   Stomach/Bowel: No evidence of obstruction, inflammatory process or abnormal fluid collections. Normal appendix visualized. Moderate-to-large amount of stool again seen throughout colon.   Vascular/Lymphatic: No pathologically enlarged lymph nodes. No acute vascular findings. Aortic atherosclerotic calcification incidentally noted.   Reproductive:  No mass or other significant abnormality.   Other:  None.   Musculoskeletal:  No suspicious bone lesions identified.   IMPRESSION: No acute findings within the  abdomen or pelvis.   Moderate-to-large stool burden noted; recommend clinical correlation for possible constipation.   Aortic Atherosclerosis (ICD10-I70.0).  PATIENT SURVEYS:  LEFS : TBA    03/23/24:  58/80=47.5%  COGNITION: Overall cognitive status: Within functional limits for tasks assessed     SENSATION: WFL   POSTURE: rounded shoulders, forward head, and flexed trunk   PALPATION: Pt tender to palpation of lateral left hip, site of greater trochanter.  LUMBAR ROM:   AROM eval  Flexion 75  Extension 10  Right lateral flexion Fingers to knees  Left lateral flexion Fingers to knees  Right rotation Full riange no pakn  Left rotation Full range no pian   (Blank rows = not tested)  LOWER EXTREMITY ROM:     Active  Right eval Left eval  Hip flexion    Hip extension    Hip abduction    Hip adduction    Hip internal rotation    Hip external rotation    Knee flexion    Knee extension    Ankle dorsiflexion    Ankle plantarflexion    Ankle inversion    Ankle eversion     (Blank rows = not tested)  LOWER EXTREMITY MMT:    MMT Right eval Left eval  Hip flexion 4 3+, pulling pain  Hip extension 4+ 4-  Hip abduction 4 4  Hip adduction 4 4  Hip internal rotation    Hip external  rotation    Knee flexion 4 4-  Knee extension 4 4-  Ankle dorsiflexion 4 4  Ankle plantarflexion    Ankle inversion    Ankle eversion     (Blank rows = not tested)  HIP TESTS:  Positive Faber test on LLE  FUNCTIONAL TESTS:  5 times sit to stand: 17.07, pain in left hip 2 minute walk test: TBA  03/23/24: 450 feet no AD  GAIT: Distance walked: 75 feet to and from treatment area Assistive device utilized: None Level of assistance: Complete Independence Comments: pt demonstrates decreased gait speed and decreased stride length bilaterally, no visible asymmetry noted on this date.  TREATMENT DATE:  04/19/24 Standing:    Rows 2X10 GTB  Extension 2X10 GTB  Pallof press 2X10 each direction GTB  Doorway stretch 3X30  Plantar fascia stretch on 4 step 30 each side X2  Wall push ups  Standing lumbar extension 5X  04/13/24: RTB rows 10x RTB shoulder extension 10x  Squat 10x  3  Reviewed body mechanics for sons's bed mobility.  Instructed on squat mechanics, being close to patient/, instructed to flex opposite extremity to assist with rolling, to lift with legs not back.  04/03/24:  Discussion and mass practice of mechanics for patient rolling  Instructed on squat mechanics, getting patient/son as close to her/EOB during bed mobility to reduce pressure on low back, importance of using LE rather than UE to push/pull to reduce discomfort in UE, using drawsheet to her advantage to pull son to EOB, using husband for assist as needed, how to provide pressure relief for son using roll technique and pillows.   03/28/2024  -Self care management: - Discussion today involving patient's needs at home for taking care of dependent son.  Discussions involved care coordination between various providers to determine in-home needs, as well as potential referrals to get new hospital bed to ease patient's ADLs and caring for her son.  HEP updated as well for to reflect shoulder activities.  Review of HEP  included in education details  below  03/23/24 Goal review :  450 feet no AD LEFS: 58/80=47.5% Discussion of POC; symptoms and concerns Standing:  doorway stretch for chest Supine:  PVC shoulder flexion (for HEP)  PATIENT EDUCATION:  Education details: Pt was educated on findings of PT evaluation, prognosis, frequency of therapy visits and rationale, attendance policy, and HEP if given.   Person educated: Patient Education method: Explanation, Verbal cues, and Handouts Education comprehension: verbalized understanding, verbal cues required, tactile cues required, and needs further education  HOME EXERCISE PROGRAM: Access Code: [email or web address] URL: [email or web address] Date: 03/15/2024 Prepared by: Lang Ada  Exercises - Sit to Stand with Arms Crossed  - 1 x daily - 7 x weekly - 3 sets - 10 reps - Supine Bridge  - 1 x daily - 7 x weekly - 3 sets - 10 reps - Standing Hip Abduction with Counter Support  - 1 x daily - 7 x weekly - 3 sets - 10 reps   Access Code: [email or web address] URL: [email or web address] Date: 03/28/2024 Prepared by: Omega Bottcher  Exercises - Seated Scapular Retraction  - 1 x daily - 7 x weekly - 3 sets - 10 reps - Shoulder External Rotation and Scapular Retraction with Resistance  - 1 x daily - 7 x weekly - 3 sets - 10 reps - Standing Shoulder Row with Anchored Resistance  - 1 x daily - 7 x weekly - 3 sets - 10 reps  04/13/24:  - Squat with Chair Touch  - 2 x daily - 7 x weekly - 1 sets - 10 reps  Access Code: [email or web address] URL: [email or web address] Date: 04/19/2024 Prepared by: Greig Fuse - Standing Anti-Rotation Press with Anchored Resistance  - 1 x daily - 7 x weekly - 10 reps - Standing Bilateral Gastroc Stretch with Step  - 1 x daily - 7 x weekly - 3 reps - 30 sec hold - Forward Step Down Touch with Heel  - 1 x daily - 7 x weekly - 10 reps - Lateral Step Down  - 1 x daily - 7 x weekly - 10  reps   ASSESSMENT:  CLINICAL IMPRESSION: Continued with focus on improving strength and improving functional mobility without pain.  Pt with reported tightness across chest and into calves so added stretches for these and updated HEP to also include them.  Pt reported positive results.  Continued with postural education and strengthening.  Form still challenging and with noted weakness.  Began pallof to improve core stab.  Pt with difficult time keeping body in neutral position throughout full range of motion.  Began eccentric quad strengthening with forward and lateral step downs as reports LE weakness when doing the transfers. Reviewed mechanics to assist with care given for her son and added exercises to HEP. Encouraged to complete lumbar extensions throughout the day as these are helpful and reduce pain.    OBJECTIVE IMPAIRMENTS: Abnormal gait, decreased activity tolerance, decreased balance, decreased endurance, decreased mobility, difficulty walking, decreased ROM, decreased strength, improper body mechanics, postural dysfunction, and pain.   ACTIVITY LIMITATIONS: carrying, lifting, bending, standing, squatting, stairs, and caring for others  PARTICIPATION LIMITATIONS: meal prep, cleaning, laundry, community activity, occupation, and yard work  PERSONAL FACTORS: Age, Fitness, Past/current experiences, Time since onset of injury/illness/exacerbation, and 1 comorbidity: hx of skin cancer are also affecting patient's functional outcome.   REHAB POTENTIAL: Fair pt has pain throughout body  CLINICAL DECISION MAKING: Evolving/moderate complexity  EVALUATION COMPLEXITY: Moderate   GOALS: Goals reviewed with patient? Yes  SHORT TERM  GOALS: Target date: 04/05/24  Pt will be independent with HEP in order to demonstrate participation in Physical Therapy POC.  Baseline: Goal status: IN PROGRESS  2.  Pt will report 5/10 pain with mobility in order to demonstrate improved pain with ADLs.   Baseline:  Goal status: IN PROGRESS  LONG TERM GOALS: Target date: 04/26/24  Pt will improve 5TSTS by 2.3 seconds in order to demonstrate improved functional strength to return to desired activities.  Baseline: see objective.  Goal status: IN PROGRESS  2.  Pt will improve 2 MWT by 40 feet in order to demonstrate improved functional ambulatory capacity in community setting.  Baseline: see objective.  Goal status: IN PROGRESS  3.  Pt will improve LEFS score by 9 points in order to demonstrate decreased difficulty with ADL and improved quality of life.  Baseline: see objective.  Goal status: IN PROGRESS  4.  Pt will report 3/10 pain with mobility in order to demonstrate reduced pain with ADLs lasting greater than 30 minutes.  Baseline: see objective.  Goal status: IN PROGRESS   PLAN:  PT FREQUENCY: 1-2x/week  PT DURATION: 6 weeks  PLANNED INTERVENTIONS: 97110-Therapeutic exercises, 97530- Therapeutic activity, 97112- Neuromuscular re-education, 97535- Self Care, [id number]- Manual therapy, [phone]- Gait training, Patient/Family education, Balance training, Stair training, Joint mobilization, Spinal mobilization, DME instructions, Cryotherapy, and Moist heat.  PLAN FOR NEXT SESSION: continue to progress HEP; LE strengthening and core stabilization.  Next session complete progress note.   Greig KATHEE Fuse, PTA/CLT [facility] Ph: [phone]  3:11 PM, 04/19/24

## 2024-04-24 ENCOUNTER — Encounter: Payer: Self-pay | Admitting: Family Medicine

## 2024-04-24 ENCOUNTER — Encounter (HOSPITAL_COMMUNITY): Admitting: Physical Therapy

## 2024-04-26 ENCOUNTER — Encounter (HOSPITAL_COMMUNITY): Payer: Self-pay

## 2024-04-26 ENCOUNTER — Other Ambulatory Visit: Payer: Self-pay | Admitting: *Deleted

## 2024-04-26 ENCOUNTER — Ambulatory Visit (HOSPITAL_COMMUNITY)

## 2024-04-26 DIAGNOSIS — G8929 Other chronic pain: Secondary | ICD-10-CM | POA: Insufficient documentation

## 2024-04-26 DIAGNOSIS — M6281 Muscle weakness (generalized): Secondary | ICD-10-CM | POA: Diagnosis not present

## 2024-04-26 DIAGNOSIS — Z8739 Personal history of other diseases of the musculoskeletal system and connective tissue: Secondary | ICD-10-CM | POA: Diagnosis not present

## 2024-04-26 DIAGNOSIS — Z7409 Other reduced mobility: Secondary | ICD-10-CM

## 2024-04-26 DIAGNOSIS — M79661 Pain in right lower leg: Secondary | ICD-10-CM | POA: Insufficient documentation

## 2024-04-26 DIAGNOSIS — M25519 Pain in unspecified shoulder: Secondary | ICD-10-CM | POA: Insufficient documentation

## 2024-04-26 DIAGNOSIS — R29898 Other symptoms and signs involving the musculoskeletal system: Secondary | ICD-10-CM | POA: Diagnosis not present

## 2024-04-26 NOTE — Therapy (Signed)
 OUTPATIENT PHYSICAL THERAPY THORACOLUMBAR TREATMENT/PROGRESS NOTE/DISCHARGE   Patient Name: [name] MRN: [id number] DOB:07-16-1956, 68 y.o., female Today's Date: 04/26/2024   PHYSICAL THERAPY DISCHARGE SUMMARY  Visits from Start of Care: 7  Current functional level related to goals / functional outcomes: See below   Remaining deficits: See below   Education / Equipment: See below   Patient agrees to discharge. Patient goals were partially met. Patient is being discharged due to the patient's request.  END OF SESSION:  PT End of Session - 04/26/24 1021     Visit Number 7    Number of Visits 8    Date for PT Re-Evaluation 04/26/24    Authorization Type [employer] MEDICARE CHOICE PPO    Authorization Time Period 8 visits approved 6/18-8/1    PT Start Time 1020    PT Stop Time 1100    PT Time Calculation (min) 40 min    Activity Tolerance Patient tolerated treatment well    Behavior During Therapy WFL for tasks assessed/performed           Past Medical History:  Diagnosis Date   GERD (gastroesophageal reflux disease)    Hyperlipidemia    Lung nodule    left    Osteopenia    Osteoporosis    Squamous cell skin cancer    Right leg   Past Surgical History:  Procedure Laterality Date   BIOPSY  11/02/2019   Procedure: BIOPSY;  Surgeon: Shaaron Lamar HERO, MD;  Location: [facility];  Service: Endoscopy;;  gastric   Biopsy right leg Right    skin   COLONOSCOPY N/A 09/17/2017   Dr. Shaaron: normal colonoscopy.  Next colonoscopy in 10 years.   ESOPHAGOGASTRODUODENOSCOPY (EGD) WITH PROPOFOL  N/A 11/02/2019   Dr. Shaaron: Gastric mucosa coated by thick tenacious bile-stained mucus.  Stomach diffusely injected with scattered focal hemorrhagic erosions but no frank ulcer.  Small hiatal hernia.  Gastric biopsy showed nonspecific reactive gastropathy, no H. pylori.  Duodenum appeared normal.   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 82/23/2021   Gastritis and gastroduodenitis 12/06/2019   Left sided abdominal pain 09/20/2019   Burping 09/20/2019   Loss of weight 09/20/2019   Change in bowel function 08/12/2017   Constipation 08/12/2017   Bloating 08/12/2017    PCP: [name] BRAVO, PA-C   REFERRING PROVIDER: [name], MD  REFERRING DIAG: M54.50,G89.29 (ICD-10-CM) - Chronic bilateral low back pain without sciatica M25.552 (ICD-10-CM) - Left hip pain  Rationale for Evaluation and Treatment: Rehabilitation  THERAPY DIAG:  Impaired functional mobility, balance, gait, and endurance  Muscle weakness (generalized)  Weakness of both lower extremities  ONSET DATE: Back, early 90s; hip, February 2024  SUBJECTIVE:  SUBJECTIVE STATEMENT: Patient reports her back and hips are better. She thinks bridges are helping her most. Some issues with balance and gait.  A couple exercises have made her knee hurt so she doesn't do them.  Her L shoulder is still hurting most now. Reports MD gave her a referral for HHPT and included shoulder. Would like to look into that to see if they can help give her any assistance/cues/training for transferring her son.    Evaluation: Pt describes extensive medical history low back pain, . 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 Pt states she is starting to look for help for caring for son as he is high maintenance. Pt report history of shoulder pain, left and right but left most recent. Pt reports her priorities would be her left knee and hip and low back. Pt states she  thinks it from overuse from caring fro adult son. Pt describes shoulder pain has kept her from performing normal UE exercise. Pt states she really wants to take care of herself as she want to remain active and healthy as long has possible.  PERTINENT HISTORY:  Chiropractor/acupuncture, for low back pain Has had PT for low back occasionally over the years -Osteopenia -Left heel pain -2 years ago, left knee torn meniscus and ACL insidious, pain ever since -History of cancer   PAIN:  Are you having pain? Yes: NPRS scale: 7-8/10 Pain location: left knee, low back and left hip Pain description: aching, dull Aggravating factors: standing and positional, caring for son Relieving factors: resting, pain meds  PRECAUTIONS: None  RED FLAGS: Hx of cancer   WEIGHT BEARING RESTRICTIONS: No  FALLS:  Has patient fallen in last 6 months? 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  DIAGNOSTIC FINDINGS:  CLINICAL DATA:  Epigastric pain. Bloating. Diarrhea. Pancreatic insufficiency. Weight loss.   EXAM: CT ABDOMEN AND PELVIS WITH CONTRAST   TECHNIQUE: Multidetector CT imaging of the abdomen and pelvis was performed using the standard protocol following bolus administration of intravenous contrast.   RADIATION DOSE REDUCTION: This exam was performed according to the departmental dose-optimization program which includes automated exposure control, adjustment of the mA and/or kV according to patient size and/or use of iterative reconstruction technique.   CONTRAST:  OMNIPAQUE  IOHEXOL  300 MG/ML  SOLN   COMPARISON:  07/23/2021   FINDINGS: Lower Chest: No acute findings.   Hepatobiliary: No hepatic masses identified. Gallbladder  is unremarkable. No evidence of biliary ductal dilatation.   Pancreas: No mass or inflammatory changes. No signs of chronic pancreatitis or parenchymal atrophy.   Spleen: Within normal limits in size and appearance.   Adrenals/Urinary Tract: No suspicious masses identified. No evidence of ureteral calculi or hydronephrosis.   Stomach/Bowel: No evidence of obstruction, inflammatory process or abnormal fluid collections. Normal appendix visualized. Moderate-to-large amount of stool again seen throughout colon.   Vascular/Lymphatic: No pathologically enlarged lymph nodes. No acute vascular findings. Aortic atherosclerotic calcification incidentally noted.   Reproductive:  No mass or other significant abnormality.   Other:  None.   Musculoskeletal:  No suspicious bone lesions identified.   IMPRESSION: No acute findings within the abdomen or pelvis.   Moderate-to-large stool burden noted; recommend clinical correlation for possible constipation.   Aortic  Atherosclerosis (ICD10-I70.0).  PATIENT SURVEYS:  LEFS : TBA    03/23/24:  58/80=47.5%  04/26/24: Lower Extremity Functional Score: 49 / 80 = 61.3 %  COGNITION: Overall cognitive status: Within functional limits for tasks assessed     SENSATION: WFL   POSTURE: rounded shoulders, forward head, and flexed trunk   PALPATION: Pt tender to palpation of lateral left hip, site of greater trochanter.  LUMBAR ROM:   AROM eval 04/26/24  Flexion 75 75% avail  Extension 10 25% avail *  Right lateral flexion Fingers to knees To knee joint  Left lateral flexion Fingers to knees Just above knee * tightness on R side  Right rotation Full range no pain   Left rotation Full range no pian    (Blank rows = not tested)   *=painful  LOWER EXTREMITY ROM:     Active  Right eval Left eval  Hip flexion    Hip extension    Hip abduction    Hip adduction    Hip internal rotation    Hip external rotation    Knee flexion    Knee  extension    Ankle dorsiflexion    Ankle plantarflexion    Ankle inversion    Ankle eversion     (Blank rows = not tested)  LOWER EXTREMITY MMT:    MMT Right eval Left eval Right  04/26/24 Left 04/26/24  Hip flexion 4 3+, pulling pain 3+ 3+ * Soreness in hip  Hip extension 4+ 4- 4- 4-  Hip abduction 4 4 4  4-  Hip adduction 4 4    Hip internal rotation      Hip external rotation      Knee flexion 4 4- 4 4-  Knee extension 4 4- 4 4-  Ankle dorsiflexion 4 4 4+ 4+  Ankle plantarflexion      Ankle inversion      Ankle eversion       (Blank rows = not tested)  HIP TESTS:  Positive Faber test on LLE  FUNCTIONAL TESTS:  5 times sit to stand: 17.07, pain in left hip 2 minute walk test: TBA  03/23/24: 450 feet no AD  04/26/24: 5 times sit to stand: 16 STS, no pain 2 Minute walk test: 425 ft, no AD  GAIT: Distance walked: 75 feet to and from treatment area Assistive device utilized: None Level of assistance: Complete Independence Comments: pt demonstrates decreased gait speed and decreased stride length bilaterally, no visible asymmetry noted on this date.  TREATMENT DATE:  04/26/24:  Progress Note:  LEFS Lumbar ROM LE MMT 5xSTS 2 minute walk test Discussion about discharge to HEP  04/19/24 Standing:    Rows 2X10 GTB  Extension 2X10 GTB  Pallof press 2X10 each direction GTB  Doorway stretch 3X30  Plantar fascia stretch on 4 step 30 each side X2  Wall push ups  Standing lumbar extension 5X  04/13/24: RTB rows 10x RTB shoulder extension 10x  Squat 10x  3  Reviewed body mechanics for sons's bed mobility.  Instructed on squat mechanics, being close to patient/, instructed to flex opposite extremity to assist with rolling, to lift with legs not back.   PATIENT EDUCATION:  Education details: Pt was educated on findings of PT evaluation, prognosis, frequency of therapy visits and rationale, attendance policy, and HEP if given.   Person educated:  Patient Education method: Explanation, Verbal cues, and Handouts Education comprehension: verbalized understanding, verbal cues required, tactile cues required, and needs further education  HOME EXERCISE PROGRAM: Access Code: [email or web address] URL: [email or web address] Date: 03/15/2024 Prepared by: Lang Ada  Exercises - Sit to Stand with Arms Crossed  - 1 x daily - 7 x weekly - 3 sets - 10 reps - Supine Bridge  - 1 x daily - 7 x weekly - 3 sets - 10 reps - Standing Hip Abduction with Counter Support  - 1 x daily - 7 x weekly - 3 sets - 10 reps   Access Code: [email or web address] URL: [email or web address] Date: 03/28/2024 Prepared by: Omega Bottcher  Exercises - Seated Scapular Retraction  - 1 x daily - 7 x weekly - 3 sets - 10 reps - Shoulder External Rotation and Scapular Retraction with Resistance  - 1 x daily - 7 x weekly - 3 sets - 10 reps - Standing Shoulder Row with Anchored Resistance  - 1 x daily - 7 x weekly - 3 sets - 10 reps  04/13/24:  - Squat with Chair Touch  - 2 x daily - 7 x weekly - 1 sets - 10 reps  Access Code: [email or web address] URL: [email or web address] Date: 04/19/2024 Prepared by: Greig Fuse - Standing Anti-Rotation Press with Anchored Resistance  - 1 x daily - 7 x weekly - 10 reps - Standing Bilateral Gastroc Stretch with Step  - 1 x daily - 7 x weekly - 3 reps - 30 sec hold - Forward Step Down Touch with Heel  - 1 x daily - 7 x weekly - 10 reps - Lateral Step Down  - 1 x daily - 7 x weekly - 10 reps   ASSESSMENT:  CLINICAL IMPRESSION: Progress note/Re-eval performed this date. Patient demonstrates improved with lumbar extension ROM and 5 times sit to stand and LE MMT indicating very mild improvements with LE strength and endurance. Patient reports she feels low back and hip pain have improved but feels most limited due to L shoulder this date. Discussion this date about being unable to treat both areas simultaneously. Patient reports  feeling comfortable with maintaining progress made with low back and hips through HEP independently and agrees with discharge. Plans to speak with MD about referral for shoulder pain. Patient discharged this date.     OBJECTIVE IMPAIRMENTS: Abnormal gait, decreased activity tolerance, decreased balance, decreased endurance, decreased mobility, difficulty walking, decreased ROM, decreased strength, improper body mechanics, postural dysfunction, and pain.   ACTIVITY LIMITATIONS: carrying, lifting, bending, standing, squatting, stairs, and caring for others  PARTICIPATION LIMITATIONS: meal prep, cleaning, laundry, community activity, occupation, and yard work  PERSONAL FACTORS: Age, Fitness, Past/current experiences, Time since onset of injury/illness/exacerbation, and 1 comorbidity: hx of skin cancer are also affecting patient's functional outcome.   REHAB POTENTIAL: Fair pt has pain throughout body  CLINICAL DECISION MAKING: Evolving/moderate complexity  EVALUATION COMPLEXITY: Moderate   GOALS: Goals reviewed with patient? Yes  SHORT TERM GOALS: Target date: 04/05/24  Pt will be independent with HEP in order to demonstrate participation in Physical Therapy POC.  Baseline: Goal status: MET  2.  Pt will report 5/10 pain with mobility in order to demonstrate improved pain with ADLs.  Baseline: 4/10 on 04/26/24 Goal status: MET  LONG TERM GOALS: Target date: 04/26/24  Pt will improve 5TSTS by 2.3 seconds in order to demonstrate improved functional strength to return to desired activities.  Baseline: see objective.  Goal status: NOT MET  2.  Pt will improve 2 MWT by 40 feet in order to demonstrate improved functional ambulatory capacity in community setting.  Baseline: see objective.  Goal status: NOT MET  3.  Pt will improve LEFS score by 9 points in order to demonstrate decreased difficulty with ADL and improved quality of life.  Baseline: see objective.  Goal status: MET  4.   Pt will report 3/10 pain with mobility in order to demonstrate reduced pain with ADLs lasting greater than 30 minutes.  Baseline: 4/10 on 04/26/24 Goal status: NOT MET   PLAN:  PT FREQUENCY: 1-2x/week  PT DURATION: 6 weeks  PLANNED INTERVENTIONS: 97110-Therapeutic exercises, 97530- Therapeutic activity, 97112- Neuromuscular re-education, 97535- Self Care, [id number]- Manual therapy, [phone]- Gait training, Patient/Family education, Balance training, Stair training, Joint mobilization, Spinal mobilization, DME instructions, Cryotherapy, and Moist heat.  PLAN FOR NEXT SESSION: Pt discharged     1:01 PM, 04/26/24 Rosaria Settler, PT, DPT [facility] [place]

## 2024-04-26 NOTE — Telephone Encounter (Signed)
 It's ok to go ahead with sending the referral to them.  There's an order in already but may need to be updated and sent.

## 2024-05-01 DIAGNOSIS — R109 Unspecified abdominal pain: Secondary | ICD-10-CM | POA: Diagnosis not present

## 2024-05-02 DIAGNOSIS — M810 Age-related osteoporosis without current pathological fracture: Secondary | ICD-10-CM | POA: Diagnosis not present

## 2024-05-02 DIAGNOSIS — G8929 Other chronic pain: Secondary | ICD-10-CM | POA: Diagnosis not present

## 2024-05-02 DIAGNOSIS — E785 Hyperlipidemia, unspecified: Secondary | ICD-10-CM | POA: Diagnosis not present

## 2024-05-02 DIAGNOSIS — K219 Gastro-esophageal reflux disease without esophagitis: Secondary | ICD-10-CM | POA: Diagnosis not present

## 2024-05-02 DIAGNOSIS — Z9089 Acquired absence of other organs: Secondary | ICD-10-CM | POA: Diagnosis not present

## 2024-05-02 DIAGNOSIS — Z85828 Personal history of other malignant neoplasm of skin: Secondary | ICD-10-CM | POA: Diagnosis not present

## 2024-05-02 DIAGNOSIS — M7542 Impingement syndrome of left shoulder: Secondary | ICD-10-CM | POA: Diagnosis not present

## 2024-05-02 DIAGNOSIS — R911 Solitary pulmonary nodule: Secondary | ICD-10-CM | POA: Diagnosis not present

## 2024-05-02 DIAGNOSIS — M67873 Other specified disorders of tendon, right ankle and foot: Secondary | ICD-10-CM | POA: Diagnosis not present

## 2024-05-05 DIAGNOSIS — R102 Pelvic and perineal pain: Secondary | ICD-10-CM | POA: Diagnosis not present

## 2024-05-10 DIAGNOSIS — M67873 Other specified disorders of tendon, right ankle and foot: Secondary | ICD-10-CM | POA: Diagnosis not present

## 2024-05-10 DIAGNOSIS — Z9089 Acquired absence of other organs: Secondary | ICD-10-CM | POA: Diagnosis not present

## 2024-05-10 DIAGNOSIS — R911 Solitary pulmonary nodule: Secondary | ICD-10-CM | POA: Diagnosis not present

## 2024-05-10 DIAGNOSIS — Z85828 Personal history of other malignant neoplasm of skin: Secondary | ICD-10-CM | POA: Diagnosis not present

## 2024-05-10 DIAGNOSIS — M7542 Impingement syndrome of left shoulder: Secondary | ICD-10-CM | POA: Diagnosis not present

## 2024-05-10 DIAGNOSIS — E785 Hyperlipidemia, unspecified: Secondary | ICD-10-CM | POA: Diagnosis not present

## 2024-05-10 DIAGNOSIS — G8929 Other chronic pain: Secondary | ICD-10-CM | POA: Diagnosis not present

## 2024-05-10 DIAGNOSIS — K219 Gastro-esophageal reflux disease without esophagitis: Secondary | ICD-10-CM | POA: Diagnosis not present

## 2024-05-10 DIAGNOSIS — M810 Age-related osteoporosis without current pathological fracture: Secondary | ICD-10-CM | POA: Diagnosis not present

## 2024-05-11 DIAGNOSIS — N958 Other specified menopausal and perimenopausal disorders: Secondary | ICD-10-CM | POA: Diagnosis not present

## 2024-05-11 DIAGNOSIS — Z01419 Encounter for gynecological examination (general) (routine) without abnormal findings: Secondary | ICD-10-CM | POA: Diagnosis not present

## 2024-05-19 DIAGNOSIS — M7542 Impingement syndrome of left shoulder: Secondary | ICD-10-CM | POA: Diagnosis not present

## 2024-05-19 DIAGNOSIS — G8929 Other chronic pain: Secondary | ICD-10-CM | POA: Diagnosis not present

## 2024-05-19 DIAGNOSIS — E785 Hyperlipidemia, unspecified: Secondary | ICD-10-CM | POA: Diagnosis not present

## 2024-05-19 DIAGNOSIS — M67873 Other specified disorders of tendon, right ankle and foot: Secondary | ICD-10-CM | POA: Diagnosis not present

## 2024-05-19 DIAGNOSIS — Z85828 Personal history of other malignant neoplasm of skin: Secondary | ICD-10-CM | POA: Diagnosis not present

## 2024-05-19 DIAGNOSIS — R911 Solitary pulmonary nodule: Secondary | ICD-10-CM | POA: Diagnosis not present

## 2024-05-19 DIAGNOSIS — M810 Age-related osteoporosis without current pathological fracture: Secondary | ICD-10-CM | POA: Diagnosis not present

## 2024-05-19 DIAGNOSIS — K219 Gastro-esophageal reflux disease without esophagitis: Secondary | ICD-10-CM | POA: Diagnosis not present

## 2024-05-19 DIAGNOSIS — Z9089 Acquired absence of other organs: Secondary | ICD-10-CM | POA: Diagnosis not present

## 2024-05-23 DIAGNOSIS — Z85828 Personal history of other malignant neoplasm of skin: Secondary | ICD-10-CM | POA: Diagnosis not present

## 2024-05-23 DIAGNOSIS — R911 Solitary pulmonary nodule: Secondary | ICD-10-CM | POA: Diagnosis not present

## 2024-05-23 DIAGNOSIS — E785 Hyperlipidemia, unspecified: Secondary | ICD-10-CM | POA: Diagnosis not present

## 2024-05-23 DIAGNOSIS — G8929 Other chronic pain: Secondary | ICD-10-CM | POA: Diagnosis not present

## 2024-05-23 DIAGNOSIS — M810 Age-related osteoporosis without current pathological fracture: Secondary | ICD-10-CM | POA: Diagnosis not present

## 2024-05-23 DIAGNOSIS — M7542 Impingement syndrome of left shoulder: Secondary | ICD-10-CM | POA: Diagnosis not present

## 2024-05-23 DIAGNOSIS — Z9089 Acquired absence of other organs: Secondary | ICD-10-CM | POA: Diagnosis not present

## 2024-05-23 DIAGNOSIS — K219 Gastro-esophageal reflux disease without esophagitis: Secondary | ICD-10-CM | POA: Diagnosis not present

## 2024-05-23 DIAGNOSIS — M67873 Other specified disorders of tendon, right ankle and foot: Secondary | ICD-10-CM | POA: Diagnosis not present

## 2024-05-30 DIAGNOSIS — G8929 Other chronic pain: Secondary | ICD-10-CM | POA: Diagnosis not present

## 2024-05-30 DIAGNOSIS — E785 Hyperlipidemia, unspecified: Secondary | ICD-10-CM | POA: Diagnosis not present

## 2024-05-30 DIAGNOSIS — M7542 Impingement syndrome of left shoulder: Secondary | ICD-10-CM | POA: Diagnosis not present

## 2024-05-30 DIAGNOSIS — R911 Solitary pulmonary nodule: Secondary | ICD-10-CM | POA: Diagnosis not present

## 2024-05-30 DIAGNOSIS — M810 Age-related osteoporosis without current pathological fracture: Secondary | ICD-10-CM | POA: Diagnosis not present

## 2024-05-30 DIAGNOSIS — Z9089 Acquired absence of other organs: Secondary | ICD-10-CM | POA: Diagnosis not present

## 2024-05-30 DIAGNOSIS — K219 Gastro-esophageal reflux disease without esophagitis: Secondary | ICD-10-CM | POA: Diagnosis not present

## 2024-05-30 DIAGNOSIS — M67873 Other specified disorders of tendon, right ankle and foot: Secondary | ICD-10-CM | POA: Diagnosis not present

## 2024-05-30 DIAGNOSIS — Z85828 Personal history of other malignant neoplasm of skin: Secondary | ICD-10-CM | POA: Diagnosis not present

## 2024-06-08 DIAGNOSIS — M7542 Impingement syndrome of left shoulder: Secondary | ICD-10-CM | POA: Diagnosis not present

## 2024-06-08 DIAGNOSIS — M810 Age-related osteoporosis without current pathological fracture: Secondary | ICD-10-CM | POA: Diagnosis not present

## 2024-06-08 DIAGNOSIS — E785 Hyperlipidemia, unspecified: Secondary | ICD-10-CM | POA: Diagnosis not present

## 2024-06-08 DIAGNOSIS — K219 Gastro-esophageal reflux disease without esophagitis: Secondary | ICD-10-CM | POA: Diagnosis not present

## 2024-06-08 DIAGNOSIS — R911 Solitary pulmonary nodule: Secondary | ICD-10-CM | POA: Diagnosis not present

## 2024-06-08 DIAGNOSIS — Z85828 Personal history of other malignant neoplasm of skin: Secondary | ICD-10-CM | POA: Diagnosis not present

## 2024-06-08 DIAGNOSIS — Z9089 Acquired absence of other organs: Secondary | ICD-10-CM | POA: Diagnosis not present

## 2024-06-08 DIAGNOSIS — M67873 Other specified disorders of tendon, right ankle and foot: Secondary | ICD-10-CM | POA: Diagnosis not present

## 2024-06-08 DIAGNOSIS — G8929 Other chronic pain: Secondary | ICD-10-CM | POA: Diagnosis not present

## 2024-06-13 DIAGNOSIS — K219 Gastro-esophageal reflux disease without esophagitis: Secondary | ICD-10-CM | POA: Diagnosis not present

## 2024-06-13 DIAGNOSIS — Z85828 Personal history of other malignant neoplasm of skin: Secondary | ICD-10-CM | POA: Diagnosis not present

## 2024-06-13 DIAGNOSIS — M810 Age-related osteoporosis without current pathological fracture: Secondary | ICD-10-CM | POA: Diagnosis not present

## 2024-06-13 DIAGNOSIS — M67873 Other specified disorders of tendon, right ankle and foot: Secondary | ICD-10-CM | POA: Diagnosis not present

## 2024-06-13 DIAGNOSIS — G8929 Other chronic pain: Secondary | ICD-10-CM | POA: Diagnosis not present

## 2024-06-13 DIAGNOSIS — E785 Hyperlipidemia, unspecified: Secondary | ICD-10-CM | POA: Diagnosis not present

## 2024-06-13 DIAGNOSIS — Z9089 Acquired absence of other organs: Secondary | ICD-10-CM | POA: Diagnosis not present

## 2024-06-13 DIAGNOSIS — R911 Solitary pulmonary nodule: Secondary | ICD-10-CM | POA: Diagnosis not present

## 2024-06-13 DIAGNOSIS — M7542 Impingement syndrome of left shoulder: Secondary | ICD-10-CM | POA: Diagnosis not present

## 2024-06-23 DIAGNOSIS — R7303 Prediabetes: Secondary | ICD-10-CM | POA: Diagnosis not present

## 2024-06-23 DIAGNOSIS — Z0000 Encounter for general adult medical examination without abnormal findings: Secondary | ICD-10-CM | POA: Diagnosis not present

## 2024-06-23 DIAGNOSIS — I7090 Unspecified atherosclerosis: Secondary | ICD-10-CM | POA: Diagnosis not present

## 2024-06-23 DIAGNOSIS — E785 Hyperlipidemia, unspecified: Secondary | ICD-10-CM | POA: Diagnosis not present

## 2024-06-26 DIAGNOSIS — Z9089 Acquired absence of other organs: Secondary | ICD-10-CM | POA: Diagnosis not present

## 2024-06-26 DIAGNOSIS — M67873 Other specified disorders of tendon, right ankle and foot: Secondary | ICD-10-CM | POA: Diagnosis not present

## 2024-06-26 DIAGNOSIS — E785 Hyperlipidemia, unspecified: Secondary | ICD-10-CM | POA: Diagnosis not present

## 2024-06-26 DIAGNOSIS — M810 Age-related osteoporosis without current pathological fracture: Secondary | ICD-10-CM | POA: Diagnosis not present

## 2024-06-26 DIAGNOSIS — M7542 Impingement syndrome of left shoulder: Secondary | ICD-10-CM | POA: Diagnosis not present

## 2024-06-26 DIAGNOSIS — K219 Gastro-esophageal reflux disease without esophagitis: Secondary | ICD-10-CM | POA: Diagnosis not present

## 2024-06-26 DIAGNOSIS — R911 Solitary pulmonary nodule: Secondary | ICD-10-CM | POA: Diagnosis not present

## 2024-06-26 DIAGNOSIS — G8929 Other chronic pain: Secondary | ICD-10-CM | POA: Diagnosis not present

## 2024-06-26 DIAGNOSIS — Z85828 Personal history of other malignant neoplasm of skin: Secondary | ICD-10-CM | POA: Diagnosis not present

## 2024-07-07 DIAGNOSIS — Z681 Body mass index (BMI) 19 or less, adult: Secondary | ICD-10-CM | POA: Diagnosis not present

## 2024-07-07 DIAGNOSIS — Z0000 Encounter for general adult medical examination without abnormal findings: Secondary | ICD-10-CM | POA: Diagnosis not present

## 2024-07-07 DIAGNOSIS — Z1389 Encounter for screening for other disorder: Secondary | ICD-10-CM | POA: Diagnosis not present

## 2024-07-07 DIAGNOSIS — Z0001 Encounter for general adult medical examination with abnormal findings: Secondary | ICD-10-CM | POA: Diagnosis not present

## 2024-07-07 DIAGNOSIS — Z1331 Encounter for screening for depression: Secondary | ICD-10-CM | POA: Diagnosis not present

## 2024-07-14 DIAGNOSIS — Z23 Encounter for immunization: Secondary | ICD-10-CM | POA: Diagnosis not present

## 2024-07-26 DIAGNOSIS — H2513 Age-related nuclear cataract, bilateral: Secondary | ICD-10-CM | POA: Diagnosis not present

## 2024-07-26 DIAGNOSIS — H01002 Unspecified blepharitis right lower eyelid: Secondary | ICD-10-CM | POA: Diagnosis not present

## 2024-07-26 DIAGNOSIS — H01001 Unspecified blepharitis right upper eyelid: Secondary | ICD-10-CM | POA: Diagnosis not present

## 2024-07-26 DIAGNOSIS — H01004 Unspecified blepharitis left upper eyelid: Secondary | ICD-10-CM | POA: Diagnosis not present

## 2024-07-26 DIAGNOSIS — H16223 Keratoconjunctivitis sicca, not specified as Sjogren's, bilateral: Secondary | ICD-10-CM | POA: Diagnosis not present

## 2024-07-26 DIAGNOSIS — H01005 Unspecified blepharitis left lower eyelid: Secondary | ICD-10-CM | POA: Diagnosis not present

## 2024-07-26 DIAGNOSIS — H00012 Hordeolum externum right lower eyelid: Secondary | ICD-10-CM | POA: Diagnosis not present

## 2024-07-31 ENCOUNTER — Ambulatory Visit: Admitting: Family Medicine

## 2024-07-31 VITALS — BP 134/69 | Ht 65.5 in

## 2024-07-31 DIAGNOSIS — S86011A Strain of right Achilles tendon, initial encounter: Secondary | ICD-10-CM | POA: Diagnosis not present

## 2024-07-31 NOTE — Progress Notes (Signed)
 PCP: Skillman, Katherine E, PA-C  Patient is a 68 y.o. female here for right achilles pain.  HPI Last Thursday (approximately 4 days ago) she was walking down her stairs into her basement and maybe felt a little bit of discomfort in her posterior right ankle/heel.  It was when she walked back up the stairs that she had significant pain to this area.  She was concerned that she had torn something.  Fortunately over the weekend this is resolved and she is not having pain today.  She has been able to continue to walk the whole time without difficulty. She has previously had left sided Achilles tendinopathy and reports that her symptoms felt similar.  Past Medical History:  Diagnosis Date   GERD (gastroesophageal reflux disease)    Hyperlipidemia    Lung nodule    left    Osteopenia    Osteoporosis    Squamous cell skin cancer    Right leg    Current Outpatient Medications on File Prior to Visit  Medication Sig Dispense Refill   Ascorbic Acid (VITA-C PO) Take by mouth daily.     COLLAGEN PO Take by mouth daily.     cycloSPORINE (RESTASIS) 0.05 % ophthalmic emulsion Place 1 drop into both eyes 2 (two) times daily.     estradiol (ESTRACE) 0.1 MG/GM vaginal cream Place 1 Applicatorful 2 (two) times a week vaginally.      Multiple Vitamin (MULTIVITAMIN) tablet Take 1 tablet by mouth daily.     Multiple Vitamins-Minerals (ALGAE BASED CALCIUM PO) Take by mouth 2 (two) times daily.     Multiple Vitamins-Minerals (PRESERVISION AREDS PO) Take by mouth.     NON FORMULARY One scoop of protein powder once daily. One scoop of Barley greens powder once daily.     polyethylene glycol powder (GLYCOLAX /MIRALAX ) 17 GM/SCOOP powder Take one capful twice daily. Hold for diarrhea. 527 g 5   Probiotic Product (PRO-BIOTIC BLEND PO) Take by mouth.     psyllium (METAMUCIL) 58.6 % packet Take 1 packet by mouth daily.     Strontium Chloride POWD by Does not apply route. capsules     valACYclovir (VALTREX) 1000  MG tablet Take 2,000 mg by mouth 2 (two) times daily as needed (fever blisters).      No current facility-administered medications on file prior to visit.    Past Surgical History:  Procedure Laterality Date   BIOPSY  11/02/2019   Procedure: BIOPSY;  Surgeon: Shaaron Lamar HERO, MD;  Location: [facility];  Service: Endoscopy;;  gastric   Biopsy right leg Right    skin   COLONOSCOPY N/A 09/17/2017   Dr. Shaaron: normal colonoscopy.  Next colonoscopy in 10 years.   ESOPHAGOGASTRODUODENOSCOPY (EGD) WITH PROPOFOL  N/A 11/02/2019   Dr. Shaaron: Gastric mucosa coated by thick tenacious bile-stained mucus.  Stomach diffusely injected with scattered focal hemorrhagic erosions but no frank ulcer.  Small hiatal hernia.  Gastric biopsy showed nonspecific reactive gastropathy, no H. pylori.  Duodenum appeared normal.   TONSILLECTOMY      Allergies  Allergen Reactions   Amoxicillin     Severe fatigue  Did it involve swelling of the face/tongue/throat, SOB, or low BP? No Did it involve sudden or severe rash/hives, skin peeling, or any reaction on the inside of your mouth or nose? No Did you need to seek medical attention at a hospital or doctor's office? No When did it last happen?      6 - 7 years If all above  answers are "NO", may proceed with cephalosporin use.    Bactrim [Sulfamethoxazole-Trimethoprim] Hives   Keflex [Cephalexin] Hives   Hydrocodone Nausea Only    BP 134/69   Ht 5' 5.5 (1.664 m)   BMI 18.68 kg/m       No data to display              No data to display              Objective:  Physical Exam Gen: Alert, NAD, comfortable in exam room  Left Foot/ankle: Inspection: No apparent swelling of calf or ankle.   Palpation: Mild TTP to posterior heel approximately 5 cm above calcaneal insertion.  Otherwise no tenderness. ROM: Full range of motion intact. Strength: 5/5 plantarflexion and dorsiflexion. Special Tests: Calcaneal squeeze test negative, Thompson elicits  plantarflexion.  Assessment and Plan:  Achilles tendinopathy - given similarity to prior and physical exam I suspect this is most likely.  Resolution of pain and negative Thompson test are reassuring against rupture.       - She will continue her home exercises daily, starting off with less intense exercises and then escalating as discussed.       - She may use ice and topical Voltaren as needed.

## 2024-07-31 NOTE — Patient Instructions (Addendum)
 It's good to see you again! You have achilles tendinopathy. I'm glad the symptoms have improved. Ice the area 15 minutes at a time as needed. Topical voltaren gel if needed for the pain up to 4 times a day. Consider [place] sock(s) when sleeping if you're waking up with quite a bit of discomfort. Follow up with me in 6 weeks or as needed if you're doing well.
